# Patient Record
Sex: Male | Born: 1950
Health system: Southern US, Community
[De-identification: ages and names within clinical notes are randomized; demographics above are authoritative.]

## PROBLEM LIST (undated history)

## (undated) ENCOUNTER — Emergency Department (HOSPITAL_COMMUNITY): Discharge status: Absent without leave | Payer: Medicare Other

## (undated) DIAGNOSIS — Z8673 Personal history of transient ischemic attack (TIA), and cerebral infarction without residual deficits: Secondary | ICD-10-CM

## (undated) DIAGNOSIS — E559 Vitamin D deficiency, unspecified: Secondary | ICD-10-CM

## (undated) DIAGNOSIS — I5022 Chronic systolic (congestive) heart failure: Secondary | ICD-10-CM

## (undated) DIAGNOSIS — M1990 Unspecified osteoarthritis, unspecified site: Secondary | ICD-10-CM

## (undated) DIAGNOSIS — I10 Essential (primary) hypertension: Secondary | ICD-10-CM

## (undated) DIAGNOSIS — I2510 Atherosclerotic heart disease of native coronary artery without angina pectoris: Secondary | ICD-10-CM

## (undated) DIAGNOSIS — E785 Hyperlipidemia, unspecified: Secondary | ICD-10-CM

## (undated) DIAGNOSIS — E039 Hypothyroidism, unspecified: Secondary | ICD-10-CM

## (undated) DIAGNOSIS — G4730 Sleep apnea, unspecified: Secondary | ICD-10-CM

## (undated) DIAGNOSIS — I255 Ischemic cardiomyopathy: Secondary | ICD-10-CM

## (undated) DIAGNOSIS — M549 Dorsalgia, unspecified: Secondary | ICD-10-CM

## (undated) DIAGNOSIS — I714 Abdominal aortic aneurysm, without rupture, unspecified: Secondary | ICD-10-CM

## (undated) DIAGNOSIS — E669 Obesity, unspecified: Secondary | ICD-10-CM

## (undated) DIAGNOSIS — I219 Acute myocardial infarction, unspecified: Secondary | ICD-10-CM

## (undated) DIAGNOSIS — K279 Peptic ulcer, site unspecified, unspecified as acute or chronic, without hemorrhage or perforation: Secondary | ICD-10-CM

## (undated) DIAGNOSIS — K219 Gastro-esophageal reflux disease without esophagitis: Secondary | ICD-10-CM

## (undated) HISTORY — DX: Hyperlipidemia, unspecified: E78.5

## (undated) HISTORY — PX: KNEE ARTHROSCOPY: SUR90

## (undated) HISTORY — DX: Gastro-esophageal reflux disease without esophagitis: K21.9

## (undated) HISTORY — DX: Abdominal aortic aneurysm, without rupture, unspecified: I71.40

## (undated) HISTORY — DX: Hypothyroidism, unspecified: E03.9

## (undated) HISTORY — DX: Unspecified osteoarthritis, unspecified site: M19.90

## (undated) HISTORY — DX: Atherosclerotic heart disease of native coronary artery without angina pectoris: I25.10

## (undated) HISTORY — DX: Chronic systolic (congestive) heart failure: I50.22

## (undated) HISTORY — DX: Ischemic cardiomyopathy: I25.5

## (undated) HISTORY — DX: Abdominal aortic aneurysm, without rupture: I71.4

## (undated) HISTORY — DX: Dorsalgia, unspecified: M54.9

## (undated) HISTORY — DX: Acute myocardial infarction, unspecified: I21.9

## (undated) HISTORY — DX: Peptic ulcer, site unspecified, unspecified as acute or chronic, without hemorrhage or perforation: K27.9

## (undated) HISTORY — DX: Essential (primary) hypertension: I10

## (undated) HISTORY — DX: Personal history of transient ischemic attack (TIA), and cerebral infarction without residual deficits: Z86.73

## (undated) HISTORY — DX: Obesity, unspecified: E66.9

---
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
## 2010-03-25 ENCOUNTER — Emergency Department (HOSPITAL_COMMUNITY): Admission: EM | Admit: 2010-03-25 | Discharge: 2010-03-25 | Payer: Self-pay | Admitting: Family Medicine

## 2010-03-29 ENCOUNTER — Emergency Department (HOSPITAL_COMMUNITY): Admission: EM | Admit: 2010-03-29 | Discharge: 2010-03-29 | Payer: Self-pay | Admitting: Internal Medicine

## 2010-04-01 ENCOUNTER — Emergency Department (HOSPITAL_COMMUNITY): Admission: EM | Admit: 2010-04-01 | Discharge: 2010-04-01 | Payer: Self-pay | Admitting: Emergency Medicine

## 2010-10-17 ENCOUNTER — Encounter: Payer: Self-pay | Admitting: Cardiology

## 2010-10-27 NOTE — Letter (Signed)
Summary: External Correspondence/ [facility]  External Correspondence/ [facility]   Imported By: Dorise Hiss 10/18/2010 15:46:00  _____________________________________________________________________  External Attachment:    Type:   Image     Comment:   External Document

## 2010-11-11 ENCOUNTER — Ambulatory Visit: Payer: Self-pay | Admitting: Cardiology

## 2010-11-24 ENCOUNTER — Encounter: Payer: Self-pay | Admitting: *Deleted

## 2010-12-05 ENCOUNTER — Encounter: Payer: Self-pay | Admitting: Cardiology

## 2010-12-06 ENCOUNTER — Encounter: Payer: Self-pay | Admitting: *Deleted

## 2010-12-06 ENCOUNTER — Encounter: Payer: Self-pay | Admitting: Cardiology

## 2010-12-06 ENCOUNTER — Ambulatory Visit (INDEPENDENT_AMBULATORY_CARE_PROVIDER_SITE_OTHER): Payer: Medicaid Other | Admitting: Cardiology

## 2010-12-06 VITALS — BP 109/73 | HR 91 | Ht 71.0 in | Wt 279.0 lb

## 2010-12-06 DIAGNOSIS — I10 Essential (primary) hypertension: Secondary | ICD-10-CM | POA: Insufficient documentation

## 2010-12-06 DIAGNOSIS — I2510 Atherosclerotic heart disease of native coronary artery without angina pectoris: Secondary | ICD-10-CM | POA: Insufficient documentation

## 2010-12-06 DIAGNOSIS — E1169 Type 2 diabetes mellitus with other specified complication: Secondary | ICD-10-CM | POA: Insufficient documentation

## 2010-12-06 DIAGNOSIS — I208 Other forms of angina pectoris: Secondary | ICD-10-CM

## 2010-12-06 DIAGNOSIS — I209 Angina pectoris, unspecified: Secondary | ICD-10-CM

## 2010-12-06 DIAGNOSIS — E782 Mixed hyperlipidemia: Secondary | ICD-10-CM

## 2010-12-06 DIAGNOSIS — F17210 Nicotine dependence, cigarettes, uncomplicated: Secondary | ICD-10-CM | POA: Insufficient documentation

## 2010-12-06 DIAGNOSIS — F17200 Nicotine dependence, unspecified, uncomplicated: Secondary | ICD-10-CM

## 2010-12-06 NOTE — Assessment & Plan Note (Signed)
Smoking cessation was discussed. He would likely be a good candidate for nicotine replacement therapy, perhaps patches with PRN gum, and could consider use of the quit line.

## 2010-12-06 NOTE — Assessment & Plan Note (Signed)
Blood pressure is well-controlled today. 

## 2010-12-06 NOTE — Assessment & Plan Note (Signed)
Diagnosis reported by patient, no specific records to support this as yet. He clearly has active risk factors, and likely does have some degree of coronary atherosclerosis.

## 2010-12-06 NOTE — Assessment & Plan Note (Signed)
Noted over the last 6 months, and with reported history of CAD and previous "heart attack." No other details are available. Given his active risk factor profile, symptoms, and reported history, we will proceed with further objective testing to include a 2-D echocardiogram as well as a 2-day Lexiscan Cardiolite. Office followup arranged.

## 2010-12-06 NOTE — Assessment & Plan Note (Signed)
Continues on statin therapy, with followup lipids to be obtained by Dr. Lysbeth Galas. Generally would aim for LDL control around 70 given reported diagnosis of coronary artery disease.

## 2010-12-06 NOTE — Patient Instructions (Signed)
Your physician has requested that you have an echocardiogram. Echocardiography is a painless test that uses sound waves to create images of your heart. It provides your doctor with information about the size and shape of your heart and how well your heart's chambers and valves are working. This procedure takes approximately one hour. There are no restrictions for this procedure. Your physician has requested that you have a lexiscan cardiolite. For further information please visit [email or web address] Please follow instruction sheet, as given. Your physician discussed the hazards of tobacco use. Tobacco use cessation is recommended and techniques and options to help you quit were discussed. Follow up in 1 month as planned.

## 2010-12-06 NOTE — Assessment & Plan Note (Signed)
We discussed basic exercise and weight loss strategies.

## 2010-12-06 NOTE — Progress Notes (Signed)
Clinical Summary [name] is a 60 y.o.male referred for cardiology consultation. He recently established with Dr. Lysbeth Barrera, seen in the office back in February of this year. His history is reviewed below. He did bring in a packet of information regarding his most recent medical followup through the [facility]. Apparently he was in federal prison for 18 years, was released last August. He states that he was in a variety of different prisons in different states over the years, most recently [place].  His most recent records do not indicate any clear history of heart disease. The patient tells me however that he had a "heart attack" that was reportedly "mild" in May of 2005 while in a prison in [place]. He states that he was managed medically, and has undergone no specific cardiac testing since that time.  Over the last 6 months he describes experiencing dyspnea on exertion when he "overdoes it" and also at thost times has a chest "tightness" that resolves with rest. This occurs no more than one or 2 times a month, and in fact has not occurred over the last month.  Old records from February 2011 showed lab work indicating cholesterol 177, triglycerides 184, HDL 35, LDL 105, TSH 3.6.  He states that he has been trying to work on smoking cessation, has quit smoking in the past. Never long term however. He denies to me any history of alcohol or drug abuse.  No Known Allergies  Current outpatient prescriptions:acetaminophen (TYLENOL) 650 MG CR tablet, Take one by mouth twice daily , Disp: , Rfl: ;  aspirin 81 MG tablet, Take 81 mg by mouth daily.  , Disp: , Rfl: ;  atorvastatin (LIPITOR) 40 MG tablet, Take 40 mg by mouth daily.  , Disp: , Rfl: ;  Cyanocobalamin (VITAMIN B-12) 2500 MCG SUBL, Take one by mouth twice daily  , Disp: , Rfl:  levothyroxine (SYNTHROID, LEVOTHROID) 100 MCG tablet, Take 1/2 by mouth daily  , Disp: , Rfl: ;  Omega-3 Fatty Acids (FISH OIL)  1200 MG CAPS, Take one by mouth twice daily  , Disp: , Rfl: ;  omeprazole (PRILOSEC) 20 MG capsule, Take 20 mg by mouth daily.  , Disp: , Rfl: ;  triamterene-hydrochlorothiazide (MAXZIDE) 75-50 MG per tablet, Take 1 tablet by mouth daily.  , Disp: , Rfl:   Past Medical History  Diagnosis Date  . Coronary artery disease     Details unclear  . Essential hypertension, benign   . GERD (gastroesophageal reflux disease)   . Arthritis     Hips and knees  . MI (myocardial infarction)     States "heart attack" 2/95 in prison in [place]  . Peptic ulcer     GIB 1994  . Obesity   . Stroke     States "stroke" with left sided weakness 5/95 in prison in [place]  . Hemorrhoids   . Back pain     Past Surgical History  Procedure Date  . Knee arthroscopy     Right knee    Family History  Problem Relation Age of Onset  . Hypertension Mother   . Diabetes Mother   . Heart attack Brother   . Aneurysm Brother   . Cirrhosis Brother     Social History Bruce Barrera reports that he has been smoking Cigarettes.  He has been smoking about 1 pack per day. He has never used smokeless tobacco. Bruce Barrera reports that he does not drink alcohol.  Review of  Systems No fevers, chills, or unusual weight change. No cough, hemoptysis, or wheezing. No palpitations, dizziness, or syncope. No dysphasia or odynophagia. Stable appetite with no abdominal pain, melena, or hematochezia. No orthopnea, PND. Occasional mild lower extremity edema. No focal motor weakness, memory problems, or speech deficits. Reports some altered sensation in his left hand following remote stroke. Has chronic problems with knee pain and hip pain related to arthritis.Otherwise systems reviewed and negative except as already outlined.   Physical Examination Filed Vitals:   12/06/10 1402  BP: 109/73  Pulse: 91  Morbidly obese male in no acute distress. HEENT: Conjunctiva and lids normal, oropharynx with poor dentition. Neck: Supple, increased  girth, no significant carotid bruits or thyromegaly. Lungs: Diminished breath sounds, generally clear, nonlabored. Cardiac: Distant regular heart sounds, indistinct PMI, no significant systolic murmur or gallop. Abdomen: Morbidly obese, protuberant, unable to palpate liver edge, bowel sounds present. No guarding or rebound. Skin: Warm and dry. Extremities: Trace edema below the knees with distal venous stasis, distal pulses one plus. Musculoskeletal: No kyphosis. Neuropsychiatric: Alert and oriented x3, affect grossly appropriate.   ECG Normal sinus rhythm at 92 beats per minute with nonspecific ST changes.  Problem List and Plan

## 2010-12-21 ENCOUNTER — Encounter: Payer: Self-pay | Admitting: Cardiology

## 2011-01-03 ENCOUNTER — Ambulatory Visit: Payer: Medicaid Other | Admitting: Cardiology

## 2011-02-08 DIAGNOSIS — I2510 Atherosclerotic heart disease of native coronary artery without angina pectoris: Secondary | ICD-10-CM

## 2011-02-17 ENCOUNTER — Encounter: Payer: Self-pay | Admitting: Cardiology

## 2011-03-09 ENCOUNTER — Ambulatory Visit (AMBULATORY_SURGERY_CENTER): Payer: Medicaid Other | Admitting: *Deleted

## 2011-03-09 VITALS — Ht 71.0 in | Wt 280.0 lb

## 2011-03-09 DIAGNOSIS — Z1211 Encounter for screening for malignant neoplasm of colon: Secondary | ICD-10-CM

## 2011-03-09 MED ORDER — PEG-KCL-NACL-NASULF-NA ASC-C 100 G PO SOLR: ORAL | Status: DC

## 2011-03-23 ENCOUNTER — Encounter: Payer: Self-pay | Admitting: Internal Medicine

## 2011-03-23 ENCOUNTER — Ambulatory Visit (AMBULATORY_SURGERY_CENTER): Payer: Medicaid Other | Admitting: Internal Medicine

## 2011-03-23 VITALS — BP 122/83 | HR 91 | Temp 98.8°F | Resp 21 | Ht 71.0 in | Wt 270.0 lb

## 2011-03-23 DIAGNOSIS — K649 Unspecified hemorrhoids: Secondary | ICD-10-CM

## 2011-03-23 DIAGNOSIS — Z1211 Encounter for screening for malignant neoplasm of colon: Secondary | ICD-10-CM

## 2011-03-23 HISTORY — PX: COLONOSCOPY: SHX174

## 2011-03-23 MED ORDER — SODIUM CHLORIDE 0.9 % IV SOLN: 500.0000 mL | INTRAVENOUS | Status: DC

## 2011-03-23 NOTE — Patient Instructions (Signed)
Discharge instructions given. Handouts on hemorrhoids and a high fiber diet. Resume previous medications.

## 2011-03-23 NOTE — Progress Notes (Signed)
Pt tolerated the colon exam very well. MAW

## 2011-03-24 ENCOUNTER — Telehealth: Payer: Self-pay | Admitting: *Deleted

## 2011-03-24 NOTE — Telephone Encounter (Signed)

## 2011-03-30 ENCOUNTER — Telehealth: Payer: Self-pay | Admitting: *Deleted

## 2011-03-30 NOTE — Telephone Encounter (Signed)
Error-No phone call made

## 2012-09-11 HISTORY — PX: TOTAL HIP ARTHROPLASTY: SHX124

## 2015-07-02 ENCOUNTER — Encounter (INDEPENDENT_AMBULATORY_CARE_PROVIDER_SITE_OTHER): Payer: Self-pay | Admitting: Ophthalmology

## 2016-03-12 ENCOUNTER — Inpatient Hospital Stay (HOSPITAL_COMMUNITY)
Admission: EM | Admit: 2016-03-12 | Discharge: 2016-03-16 | DRG: 280 | Disposition: A | Discharge status: Home Health | Payer: Medicaid Other | Attending: Internal Medicine | Admitting: Internal Medicine

## 2016-03-12 ENCOUNTER — Encounter (HOSPITAL_COMMUNITY): Payer: Self-pay

## 2016-03-12 ENCOUNTER — Emergency Department (HOSPITAL_COMMUNITY): Payer: Medicaid Other

## 2016-03-12 DIAGNOSIS — I5021 Acute systolic (congestive) heart failure: Secondary | ICD-10-CM | POA: Diagnosis present

## 2016-03-12 DIAGNOSIS — E1165 Type 2 diabetes mellitus with hyperglycemia: Secondary | ICD-10-CM | POA: Diagnosis present

## 2016-03-12 DIAGNOSIS — I2109 ST elevation (STEMI) myocardial infarction involving other coronary artery of anterior wall: Principal | ICD-10-CM | POA: Diagnosis present

## 2016-03-12 DIAGNOSIS — J441 Chronic obstructive pulmonary disease with (acute) exacerbation: Secondary | ICD-10-CM | POA: Diagnosis present

## 2016-03-12 DIAGNOSIS — I69354 Hemiplegia and hemiparesis following cerebral infarction affecting left non-dominant side: Secondary | ICD-10-CM

## 2016-03-12 DIAGNOSIS — I272 Other secondary pulmonary hypertension: Secondary | ICD-10-CM | POA: Diagnosis present

## 2016-03-12 DIAGNOSIS — I255 Ischemic cardiomyopathy: Secondary | ICD-10-CM | POA: Diagnosis present

## 2016-03-12 DIAGNOSIS — I110 Hypertensive heart disease with heart failure: Secondary | ICD-10-CM | POA: Diagnosis present

## 2016-03-12 DIAGNOSIS — Z7982 Long term (current) use of aspirin: Secondary | ICD-10-CM

## 2016-03-12 DIAGNOSIS — M1990 Unspecified osteoarthritis, unspecified site: Secondary | ICD-10-CM | POA: Diagnosis present

## 2016-03-12 DIAGNOSIS — R079 Chest pain, unspecified: Secondary | ICD-10-CM | POA: Diagnosis present

## 2016-03-12 DIAGNOSIS — IMO0001 Reserved for inherently not codable concepts without codable children: Secondary | ICD-10-CM | POA: Diagnosis present

## 2016-03-12 DIAGNOSIS — F17210 Nicotine dependence, cigarettes, uncomplicated: Secondary | ICD-10-CM | POA: Diagnosis present

## 2016-03-12 DIAGNOSIS — J449 Chronic obstructive pulmonary disease, unspecified: Secondary | ICD-10-CM | POA: Diagnosis present

## 2016-03-12 DIAGNOSIS — I2510 Atherosclerotic heart disease of native coronary artery without angina pectoris: Secondary | ICD-10-CM | POA: Diagnosis present

## 2016-03-12 DIAGNOSIS — E669 Obesity, unspecified: Secondary | ICD-10-CM | POA: Diagnosis present

## 2016-03-12 DIAGNOSIS — Z6834 Body mass index (BMI) 34.0-34.9, adult: Secondary | ICD-10-CM

## 2016-03-12 DIAGNOSIS — E785 Hyperlipidemia, unspecified: Secondary | ICD-10-CM | POA: Diagnosis present

## 2016-03-12 DIAGNOSIS — I10 Essential (primary) hypertension: Secondary | ICD-10-CM | POA: Diagnosis present

## 2016-03-12 DIAGNOSIS — M7989 Other specified soft tissue disorders: Secondary | ICD-10-CM

## 2016-03-12 DIAGNOSIS — Z9981 Dependence on supplemental oxygen: Secondary | ICD-10-CM

## 2016-03-12 DIAGNOSIS — I214 Non-ST elevation (NSTEMI) myocardial infarction: Secondary | ICD-10-CM

## 2016-03-12 DIAGNOSIS — E039 Hypothyroidism, unspecified: Secondary | ICD-10-CM | POA: Diagnosis present

## 2016-03-12 DIAGNOSIS — E7800 Pure hypercholesterolemia, unspecified: Secondary | ICD-10-CM | POA: Diagnosis present

## 2016-03-12 DIAGNOSIS — Z79899 Other long term (current) drug therapy: Secondary | ICD-10-CM

## 2016-03-12 DIAGNOSIS — K219 Gastro-esophageal reflux disease without esophagitis: Secondary | ICD-10-CM | POA: Diagnosis present

## 2016-03-12 DIAGNOSIS — I252 Old myocardial infarction: Secondary | ICD-10-CM

## 2016-03-12 LAB — I-STAT TROPONIN, ED: Troponin i, poc: 0.03 ng/mL (ref 0.00–0.08)

## 2016-03-12 LAB — CBC
HCT: 49.4 % (ref 39.0–52.0)
Hemoglobin: 17 g/dL (ref 13.0–17.0)
MCH: 33.7 pg (ref 26.0–34.0)
MCHC: 34.4 g/dL (ref 30.0–36.0)
MCV: 98 fL (ref 78.0–100.0)
Platelets: 124 10*3/uL — ABNORMAL LOW (ref 150–400)
RBC: 5.04 MIL/uL (ref 4.22–5.81)
RDW: 14.5 % (ref 11.5–15.5)
WBC: 10.9 10*3/uL — ABNORMAL HIGH (ref 4.0–10.5)

## 2016-03-12 MED ORDER — FENTANYL CITRATE (PF) 100 MCG/2ML IJ SOLN
50.0000 ug | Freq: Once | INTRAMUSCULAR | Status: AC
  Administered 2016-03-13: 50 ug via INTRAVENOUS
  Filled 2016-03-12: qty 2

## 2016-03-12 MED ORDER — SODIUM CHLORIDE 0.9 % IV BOLUS (SEPSIS)
1000.0000 mL | Freq: Once | INTRAVENOUS | Status: AC
  Administered 2016-03-13: 1000 mL via INTRAVENOUS

## 2016-03-12 MED ORDER — ASPIRIN 81 MG PO CHEW
324.0000 mg | CHEWABLE_TABLET | Freq: Once | ORAL | Status: AC
  Administered 2016-03-13: 324 mg via ORAL
  Filled 2016-03-12: qty 4

## 2016-03-12 MED ORDER — ONDANSETRON HCL 4 MG/2ML IJ SOLN
4.0000 mg | Freq: Once | INTRAMUSCULAR | Status: AC
  Administered 2016-03-13: 4 mg via INTRAVENOUS
  Filled 2016-03-12: qty 2

## 2016-03-12 NOTE — ED Provider Notes (Signed)
TIME SEEN: 11:30 PM  CHIEF COMPLAINT:  Chest pain, shortness of breath  HPI: Pt is a 65 y.o. male with history of hypertension, hyperlipidemia, reported history of a stroke and heart attack while in prison in 1995 but denies history of cardiac catheter position or stent placement who presents emergency department with chest pain that started around 5:30 to 6 PM while at rest tonight. Describes it as a tightness, pressure like he "can't open his lungs up" that radiates up into his throat and he feels that his throat is closing. States he feels short of breath, nauseous and was diaphoretic. No dizziness. Has had some dry cough. No fever. No lower extreme swelling or pain. No history of PE or DVT. PCP is Dr. Lysbeth GalasNyland. Does not have a cardiologist here. States his last stress test was 4 years ago. He is on 2 L of oxygen at home as he states he has "bad allergies" and "a touch of COPD". States he is not sure if this is similar to his prior heart attack because he was "out of it" in 1995 were not occurred.  ROS: See HPI Constitutional: no fever  Eyes: no drainage  ENT: no runny nose   Cardiovascular:   chest pain  Resp:  SOB  GI: no vomiting GU: no dysuria Integumentary: no rash  Allergy: no hives  Musculoskeletal: no leg swelling  Neurological: no slurred speech ROS otherwise negative  PAST MEDICAL HISTORY/PAST SURGICAL HISTORY:  Past Medical History  Diagnosis Date  . Coronary artery disease     Details unclear  . Essential hypertension, benign   . GERD (gastroesophageal reflux disease)   . Arthritis     Hips and knees  . MI (myocardial infarction) (HCC)     States "heart attack" 2/95 in prison in [place]  . Peptic ulcer     GIB 1994  . Obesity   . Stroke [facility](HCC)     States "stroke" with left sided weakness 5/95 in prison in [place]  . Hemorrhoids   . Back pain   . Hyperlipidemia   . Thyroid disease     MEDICATIONS:  Prior to Admission medications   Medication Sig Start Date End Date  Taking? Authorizing Provider  aspirin 81 MG tablet Take 81 mg by mouth daily.     Yes Historical Provider, MD  atorvastatin (LIPITOR) 40 MG tablet Take 40 mg by mouth daily.     Yes Historical Provider, MD  Cyanocobalamin (VITAMIN B-12) 2500 MCG SUBL Take one by mouth twice daily     Yes Historical Provider, MD  levothyroxine (SYNTHROID, LEVOTHROID) 100 MCG tablet Take 1/2 by mouth daily     Yes Historical Provider, MD  metoprolol succinate (TOPROL-XL) 25 MG 24 hr tablet Take 25 mg by mouth daily.   Yes Historical Provider, MD  Omega-3 Fatty Acids (FISH OIL) 1200 MG CAPS Take one by mouth twice daily     Yes Historical Provider, MD  omeprazole (PRILOSEC) 20 MG capsule Take 20 mg by mouth daily.     Yes Historical Provider, MD  pravastatin (PRAVACHOL) 40 MG tablet Take 40 mg by mouth daily.   Yes Historical Provider, MD  triamterene-hydrochlorothiazide (MAXZIDE) 75-50 MG per tablet Take 1 tablet by mouth daily.     Yes Historical Provider, MD  acetaminophen (TYLENOL) 650 MG CR tablet Take one by mouth twice daily     Historical Provider, MD  celecoxib (CELEBREX) 200 MG capsule Take 200 mg by mouth daily.  Historical Provider, MD  vardenafil (LEVITRA) 20 MG tablet Take 20 mg by mouth daily as needed.      Historical Provider, MD    ALLERGIES:  No Known Allergies  SOCIAL HISTORY:  Social History  Substance Use Topics  . Smoking status: Current Every Day Smoker -- 1.00 packs/day    Types: Cigarettes  . Smokeless tobacco: Never Used  . Alcohol Use: No    FAMILY HISTORY: Family History  Problem Relation Age of Onset  . Hypertension Mother   . Diabetes Mother   . Heart disease Mother   . Stroke Mother   . Heart attack Brother   . Aneurysm Brother   . Cirrhosis Brother   . Stroke Father     EXAM: BP 96/65 mmHg  Pulse 84  Temp(Src) 98.2 F (36.8 C) (Oral)  Resp 18  SpO2 89% CONSTITUTIONAL: Alert and oriented and responds appropriately to questions. Chronically  ill-appearing, appears uncomfortable, afebrile and nontoxic HEAD: Normocephalic EYES: Conjunctivae clear, PERRL ENT: normal nose; no rhinorrhea; moist mucous membranes NECK: Supple, no meningismus, no LAD  CARD: RRR; S1 and S2 appreciated; no murmurs, no clicks, no rubs, no gallops RESP: Normal chest excursion without splinting or tachypnea; breath sounds clear and equal bilaterally; no wheezes, no rhonchi, no rales, patient is hypoxic on room air, no respiratory distress, speaking full sentences ABD/GI: Normal bowel sounds; non-distended; soft, non-tender, no rebound, no guarding, no peritoneal signs BACK:  The back appears normal and is non-tender to palpation, there is no CVA tenderness EXT: Normal ROM in all joints; non-tender to palpation; no edema; normal capillary refill; no cyanosis, no calf tenderness or swelling    SKIN: Normal color for age and race; warm; no rash NEURO: Moves all extremities equally, sensation to light touch intact diffusely, cranial nerves II through XII intact PSYCH: The patient's mood and manner are appropriate. Grooming and personal hygiene are appropriate.  MEDICAL DECISION MAKING: Patient here with chest pain, shortness of breath. No old EKG for comparison but does have concerning ST morphology and anterior leads. Does not meet STEMI criteria at this time. Has had constant symptoms for 6 hours. I-STAT troponin is 0.03. We'll obtain cardiac labs, chest x-ray. Has slightly low blood pressure. Will hold off on nitroglycerin give fentanyl for pain, IV fluids, Zofran. We'll give aspirin. I feel he will need admission.  ED PROGRESS: Pt's regular troponin is mildly elevated at 0.04 but this is in the setting of having constant pain for 6 hours. BNP mildly elevated at 361. D-dimer positive at 0.87. Will proceed with CT scan of his chest. Chest x-ray shows bronchial thickening without infiltrate, edema. Patient's pain is improving with IV fentanyl. Blood pressure has also  improved with IV fluids.    CT scan shows no pulmonary embolus. He has bronchial thickening that has progressed from prior radiograph. We'll give DuoNeb, Solu-Medrol. I feel he will need admission for chest pain rule out. We'll discuss with hospitalist.  2:05 AM  Discussed patient's case with hospitalist, Dr. Sharl Ma.  Recommend admission to inpatient, telemetry bed.  I will place holding orders per their request. Patient and family (if present) updated with plan. Care transferred to hospitalist service.  I reviewed all nursing notes, vitals, pertinent old records, EKGs, labs, imaging (as available).     EKG Interpretation  Date/Time:  Sunday March 12 2016 23:07:38 EDT Ventricular Rate:  78 PR Interval:    QRS Duration: 108 QT Interval:  448 QTC Calculation: 511 R Axis:   -  63 Text Interpretation:  Sinus rhythm Left anterior fascicular block Probable anterolateral infarct, age indeterm Abnormal T, consider ischemia, lateral leads Prolonged QT interval Baseline wander in lead(s) II V6 Baseline wander No old tracing to compare Confirmed by Firas Guardado,  DO, Ivyanna Sibert [phone]([id number]) on 03/12/2016 11:31:54 PM          EKG Interpretation  Date/Time:  Sunday March 12 2016 23:32:09 EDT Ventricular Rate:  70 PR Interval:    QRS Duration: 121 QT Interval:  473 QTC Calculation: 511 R Axis:   -76 Text Interpretation:  Sinus rhythm IVCD, consider atypical RBBB Anterior infarct, age indeterminate Lateral leads are also involved Confirmed by Jenan Ellegood,  DO, Lahna Nath [phone] on 03/12/2016 11:36:09 PM        Layla MawKristen N Ciarah Peace, DO 03/13/16 0206

## 2016-03-12 NOTE — ED Notes (Signed)
Central chest pain X3 hours. Nausea shortness of breath and diaphoresis. Patient states "it feels like my throat is closing up"

## 2016-03-13 ENCOUNTER — Observation Stay (HOSPITAL_COMMUNITY): Payer: Medicaid Other

## 2016-03-13 ENCOUNTER — Emergency Department (HOSPITAL_COMMUNITY): Payer: Medicaid Other

## 2016-03-13 ENCOUNTER — Observation Stay (HOSPITAL_BASED_OUTPATIENT_CLINIC_OR_DEPARTMENT_OTHER): Payer: Medicaid Other

## 2016-03-13 ENCOUNTER — Encounter (HOSPITAL_COMMUNITY): Payer: Self-pay | Admitting: *Deleted

## 2016-03-13 DIAGNOSIS — J441 Chronic obstructive pulmonary disease with (acute) exacerbation: Secondary | ICD-10-CM | POA: Diagnosis present

## 2016-03-13 DIAGNOSIS — I69354 Hemiplegia and hemiparesis following cerebral infarction affecting left non-dominant side: Secondary | ICD-10-CM | POA: Diagnosis not present

## 2016-03-13 DIAGNOSIS — I2109 ST elevation (STEMI) myocardial infarction involving other coronary artery of anterior wall: Secondary | ICD-10-CM | POA: Diagnosis present

## 2016-03-13 DIAGNOSIS — E669 Obesity, unspecified: Secondary | ICD-10-CM | POA: Diagnosis present

## 2016-03-13 DIAGNOSIS — Z9981 Dependence on supplemental oxygen: Secondary | ICD-10-CM | POA: Diagnosis not present

## 2016-03-13 DIAGNOSIS — K219 Gastro-esophageal reflux disease without esophagitis: Secondary | ICD-10-CM | POA: Diagnosis present

## 2016-03-13 DIAGNOSIS — E1165 Type 2 diabetes mellitus with hyperglycemia: Secondary | ICD-10-CM | POA: Diagnosis present

## 2016-03-13 DIAGNOSIS — R7989 Other specified abnormal findings of blood chemistry: Secondary | ICD-10-CM | POA: Diagnosis not present

## 2016-03-13 DIAGNOSIS — Z7982 Long term (current) use of aspirin: Secondary | ICD-10-CM | POA: Diagnosis not present

## 2016-03-13 DIAGNOSIS — I110 Hypertensive heart disease with heart failure: Secondary | ICD-10-CM | POA: Diagnosis present

## 2016-03-13 DIAGNOSIS — I209 Angina pectoris, unspecified: Secondary | ICD-10-CM | POA: Diagnosis not present

## 2016-03-13 DIAGNOSIS — M1990 Unspecified osteoarthritis, unspecified site: Secondary | ICD-10-CM | POA: Diagnosis present

## 2016-03-13 DIAGNOSIS — I255 Ischemic cardiomyopathy: Secondary | ICD-10-CM | POA: Diagnosis present

## 2016-03-13 DIAGNOSIS — F17210 Nicotine dependence, cigarettes, uncomplicated: Secondary | ICD-10-CM | POA: Diagnosis present

## 2016-03-13 DIAGNOSIS — E039 Hypothyroidism, unspecified: Secondary | ICD-10-CM | POA: Diagnosis present

## 2016-03-13 DIAGNOSIS — I252 Old myocardial infarction: Secondary | ICD-10-CM | POA: Diagnosis not present

## 2016-03-13 DIAGNOSIS — R079 Chest pain, unspecified: Secondary | ICD-10-CM

## 2016-03-13 DIAGNOSIS — Z6834 Body mass index (BMI) 34.0-34.9, adult: Secondary | ICD-10-CM | POA: Diagnosis not present

## 2016-03-13 DIAGNOSIS — E7800 Pure hypercholesterolemia, unspecified: Secondary | ICD-10-CM | POA: Diagnosis present

## 2016-03-13 DIAGNOSIS — R072 Precordial pain: Secondary | ICD-10-CM | POA: Diagnosis not present

## 2016-03-13 DIAGNOSIS — J449 Chronic obstructive pulmonary disease, unspecified: Secondary | ICD-10-CM | POA: Diagnosis present

## 2016-03-13 DIAGNOSIS — I5021 Acute systolic (congestive) heart failure: Secondary | ICD-10-CM | POA: Diagnosis present

## 2016-03-13 DIAGNOSIS — I25118 Atherosclerotic heart disease of native coronary artery with other forms of angina pectoris: Secondary | ICD-10-CM | POA: Diagnosis not present

## 2016-03-13 DIAGNOSIS — I272 Other secondary pulmonary hypertension: Secondary | ICD-10-CM | POA: Diagnosis present

## 2016-03-13 DIAGNOSIS — Z79899 Other long term (current) drug therapy: Secondary | ICD-10-CM | POA: Diagnosis not present

## 2016-03-13 DIAGNOSIS — I10 Essential (primary) hypertension: Secondary | ICD-10-CM | POA: Diagnosis not present

## 2016-03-13 DIAGNOSIS — E785 Hyperlipidemia, unspecified: Secondary | ICD-10-CM | POA: Diagnosis present

## 2016-03-13 DIAGNOSIS — I2510 Atherosclerotic heart disease of native coronary artery without angina pectoris: Secondary | ICD-10-CM | POA: Diagnosis present

## 2016-03-13 LAB — GLUCOSE, CAPILLARY
Glucose-Capillary: 187 mg/dL — ABNORMAL HIGH (ref 65–99)
Glucose-Capillary: 272 mg/dL — ABNORMAL HIGH (ref 65–99)
Glucose-Capillary: 239 mg/dL — ABNORMAL HIGH (ref 65–99)

## 2016-03-13 LAB — ECHOCARDIOGRAM COMPLETE
E decel time: 187 msec
E/e' ratio: 11.26
FS: 35 % (ref 28–44)
Height: 71 in
IVS/LV PW RATIO, ED: 1
LA ID, A-P, ES: 41 mm
LA diam end sys: 41 mm
LA diam index: 1.74 cm/m2
LA vol A4C: 79 ml
LA vol index: 30.3 mL/m2
LA vol: 71.5 mL
LV E/e' medial: 11.26
LV E/e'average: 11.26
LV PW d: 12.6 mm — AB (ref 0.6–1.1)
LV e' LATERAL: 8.49 cm/s
LVOT area: 3.14 cm2
LVOT diameter: 20 mm
MV Dec: 187
MV Peak grad: 4 mmHg
MV pk A vel: 108 m/s
MV pk E vel: 95.6 m/s
TAPSE: 32.1 mm
TDI e' lateral: 8.49
TDI e' medial: 6.53
Weight: 4162.28 oz

## 2016-03-13 LAB — COMPREHENSIVE METABOLIC PANEL
ALT: 20 U/L (ref 17–63)
AST: 21 U/L (ref 15–41)
Albumin: 3.6 g/dL (ref 3.5–5.0)
Alkaline Phosphatase: 100 U/L (ref 38–126)
Anion gap: 5 (ref 5–15)
BUN: 12 mg/dL (ref 6–20)
CO2: 26 mmol/L (ref 22–32)
Calcium: 8.4 mg/dL — ABNORMAL LOW (ref 8.9–10.3)
Chloride: 105 mmol/L (ref 101–111)
Creatinine, Ser: 0.62 mg/dL (ref 0.61–1.24)
GFR calc Af Amer: >60 mL/min (ref >60)
GFR calc non Af Amer: >60 mL/min (ref >60)
Glucose, Bld: 200 mg/dL — ABNORMAL HIGH (ref 65–99)
Potassium: 3.7 mmol/L (ref 3.5–5.1)
Sodium: 136 mmol/L (ref 135–145)
Total Bilirubin: 0.7 mg/dL (ref 0.3–1.2)
Total Protein: 6.7 g/dL (ref 6.5–8.1)

## 2016-03-13 LAB — CBC
HCT: 46.3 % (ref 39.0–52.0)
Hemoglobin: 15.3 g/dL (ref 13.0–17.0)
MCH: 32.3 pg (ref 26.0–34.0)
MCHC: 33 g/dL (ref 30.0–36.0)
MCV: 97.7 fL (ref 78.0–100.0)
Platelets: 115 10*3/uL — ABNORMAL LOW (ref 150–400)
RBC: 4.74 MIL/uL (ref 4.22–5.81)
RDW: 14.5 % (ref 11.5–15.5)
WBC: 13.7 10*3/uL — ABNORMAL HIGH (ref 4.0–10.5)

## 2016-03-13 LAB — BRAIN NATRIURETIC PEPTIDE: B Natriuretic Peptide: 361 pg/mL — ABNORMAL HIGH (ref 0.0–100.0)

## 2016-03-13 LAB — BASIC METABOLIC PANEL
Anion gap: 10 (ref 5–15)
BUN: 18 mg/dL (ref 6–20)
CO2: 25 mmol/L (ref 22–32)
Calcium: 8.7 mg/dL — ABNORMAL LOW (ref 8.9–10.3)
Chloride: 102 mmol/L (ref 101–111)
Creatinine, Ser: 0.87 mg/dL (ref 0.61–1.24)
GFR calc Af Amer: >60 mL/min (ref >60)
GFR calc non Af Amer: >60 mL/min (ref >60)
Glucose, Bld: 209 mg/dL — ABNORMAL HIGH (ref 65–99)
Potassium: 3.5 mmol/L (ref 3.5–5.1)
Sodium: 137 mmol/L (ref 135–145)

## 2016-03-13 LAB — HEPARIN LEVEL (UNFRACTIONATED): Heparin Unfractionated: 0.13 IU/mL — ABNORMAL LOW (ref 0.30–0.70)

## 2016-03-13 LAB — D-DIMER, QUANTITATIVE: D-Dimer, Quant: 0.87 ug/mL-FEU — ABNORMAL HIGH (ref 0.00–0.50)

## 2016-03-13 LAB — TROPONIN I
Troponin I: 0.04 ng/mL (ref <0.03)
Troponin I: 0.03 ng/mL (ref <0.03)
Troponin I: 0.03 ng/mL (ref <0.03)
Troponin I: <0.03 ng/mL (ref <0.03)

## 2016-03-13 MED ORDER — NITROGLYCERIN 0.4 MG SL SUBL: 0.4000 mg | SUBLINGUAL_TABLET | SUBLINGUAL | Status: DC | PRN

## 2016-03-13 MED ORDER — SODIUM CHLORIDE 0.9% FLUSH
3.0000 mL | Freq: Two times a day (BID) | INTRAVENOUS | Status: DC
  Administered 2016-03-13 – 2016-03-16 (×7): 3 mL via INTRAVENOUS

## 2016-03-13 MED ORDER — PANTOPRAZOLE SODIUM 40 MG PO TBEC
40.0000 mg | DELAYED_RELEASE_TABLET | Freq: Every day | ORAL | Status: DC
  Administered 2016-03-13 – 2016-03-16 (×3): 40 mg via ORAL
  Filled 2016-03-13 (×3): qty 1

## 2016-03-13 MED ORDER — IPRATROPIUM-ALBUTEROL 0.5-2.5 (3) MG/3ML IN SOLN
3.0000 mL | Freq: Four times a day (QID) | RESPIRATORY_TRACT | Status: DC
  Administered 2016-03-13 (×3): 3 mL via RESPIRATORY_TRACT
  Filled 2016-03-13 (×3): qty 3

## 2016-03-13 MED ORDER — FENTANYL CITRATE (PF) 100 MCG/2ML IJ SOLN
50.0000 ug | INTRAMUSCULAR | Status: DC | PRN
Start: 2016-03-13 — End: 2016-03-13
  Administered 2016-03-13: 50 ug via INTRAVENOUS
  Filled 2016-03-13: qty 2

## 2016-03-13 MED ORDER — IPRATROPIUM-ALBUTEROL 0.5-2.5 (3) MG/3ML IN SOLN
3.0000 mL | Freq: Three times a day (TID) | RESPIRATORY_TRACT | Status: DC
  Administered 2016-03-14: 3 mL via RESPIRATORY_TRACT
  Filled 2016-03-13: qty 3

## 2016-03-13 MED ORDER — IPRATROPIUM-ALBUTEROL 0.5-2.5 (3) MG/3ML IN SOLN
3.0000 mL | Freq: Once | RESPIRATORY_TRACT | Status: AC
  Administered 2016-03-13: 3 mL via RESPIRATORY_TRACT
  Filled 2016-03-13: qty 3

## 2016-03-13 MED ORDER — TRIAMTERENE-HCTZ 75-50 MG PO TABS
1.0000 | ORAL_TABLET | Freq: Every day | ORAL | Status: DC
  Administered 2016-03-13: 1 via ORAL
  Filled 2016-03-13: qty 1

## 2016-03-13 MED ORDER — METHYLPREDNISOLONE SODIUM SUCC 125 MG IJ SOLR
125.0000 mg | Freq: Once | INTRAMUSCULAR | Status: AC
  Administered 2016-03-13: 125 mg via INTRAVENOUS
  Filled 2016-03-13: qty 2

## 2016-03-13 MED ORDER — ONDANSETRON HCL 4 MG PO TABS: 4.0000 mg | ORAL_TABLET | Freq: Four times a day (QID) | ORAL | Status: DC | PRN

## 2016-03-13 MED ORDER — HEPARIN BOLUS VIA INFUSION
4000.0000 [IU] | Freq: Once | INTRAVENOUS | Status: AC
  Administered 2016-03-13: 4000 [IU] via INTRAVENOUS
  Filled 2016-03-13: qty 4000

## 2016-03-13 MED ORDER — METHYLPREDNISOLONE SODIUM SUCC 125 MG IJ SOLR
60.0000 mg | Freq: Four times a day (QID) | INTRAMUSCULAR | Status: DC
  Administered 2016-03-13 – 2016-03-14 (×5): 60 mg via INTRAVENOUS
  Filled 2016-03-13 (×5): qty 2

## 2016-03-13 MED ORDER — INSULIN ASPART 100 UNIT/ML ~~LOC~~ SOLN
0.0000 [IU] | Freq: Three times a day (TID) | SUBCUTANEOUS | Status: DC
  Administered 2016-03-13: 5 [IU] via SUBCUTANEOUS
  Administered 2016-03-13: 2 [IU] via SUBCUTANEOUS
  Administered 2016-03-14 (×3): 3 [IU] via SUBCUTANEOUS
  Administered 2016-03-15: 5 [IU] via SUBCUTANEOUS
  Administered 2016-03-15: 1 [IU] via SUBCUTANEOUS

## 2016-03-13 MED ORDER — IPRATROPIUM-ALBUTEROL 0.5-2.5 (3) MG/3ML IN SOLN: 3.0000 mL | RESPIRATORY_TRACT | Status: DC | PRN

## 2016-03-13 MED ORDER — CETYLPYRIDINIUM CHLORIDE 0.05 % MT LIQD
7.0000 mL | Freq: Two times a day (BID) | OROMUCOSAL | Status: DC
Start: 2016-03-13 — End: 2016-03-16
  Administered 2016-03-14 – 2016-03-16 (×3): 7 mL via OROMUCOSAL

## 2016-03-13 MED ORDER — ASPIRIN EC 81 MG PO TBEC
81.0000 mg | DELAYED_RELEASE_TABLET | Freq: Every day | ORAL | Status: DC
  Administered 2016-03-13 – 2016-03-16 (×3): 81 mg via ORAL
  Filled 2016-03-13 (×3): qty 1

## 2016-03-13 MED ORDER — PERFLUTREN LIPID MICROSPHERE
1.0000 mL | INTRAVENOUS | Status: AC | PRN
  Administered 2016-03-13: 2 mL via INTRAVENOUS

## 2016-03-13 MED ORDER — ENOXAPARIN SODIUM 40 MG/0.4ML ~~LOC~~ SOLN
40.0000 mg | SUBCUTANEOUS | Status: DC
  Administered 2016-03-13: 40 mg via SUBCUTANEOUS
  Filled 2016-03-13: qty 0.4

## 2016-03-13 MED ORDER — HEPARIN BOLUS VIA INFUSION
2000.0000 [IU] | Freq: Once | INTRAVENOUS | Status: AC
  Administered 2016-03-13: 2000 [IU] via INTRAVENOUS
  Filled 2016-03-13: qty 2000

## 2016-03-13 MED ORDER — LEVOTHYROXINE SODIUM 50 MCG PO TABS
50.0000 ug | ORAL_TABLET | Freq: Every day | ORAL | Status: DC
  Administered 2016-03-13 – 2016-03-16 (×4): 50 ug via ORAL
  Filled 2016-03-13 (×4): qty 1

## 2016-03-13 MED ORDER — IOPAMIDOL (ISOVUE-370) INJECTION 76%
100.0000 mL | Freq: Once | INTRAVENOUS | Status: AC | PRN
  Administered 2016-03-13: 100 mL via INTRAVENOUS

## 2016-03-13 MED ORDER — METOPROLOL SUCCINATE ER 25 MG PO TB24
25.0000 mg | ORAL_TABLET | Freq: Every day | ORAL | Status: DC
  Administered 2016-03-13 – 2016-03-14 (×2): 25 mg via ORAL
  Filled 2016-03-13 (×2): qty 1

## 2016-03-13 MED ORDER — SACUBITRIL-VALSARTAN 24-26 MG PO TABS
1.0000 | ORAL_TABLET | Freq: Two times a day (BID) | ORAL | Status: DC
  Administered 2016-03-13 – 2016-03-16 (×6): 1 via ORAL
  Filled 2016-03-13 (×9): qty 1

## 2016-03-13 MED ORDER — HEPARIN (PORCINE) IN NACL 100-0.45 UNIT/ML-% IJ SOLN
1800.0000 [IU]/h | INTRAMUSCULAR | Status: DC
  Administered 2016-03-13: 1200 [IU]/h via INTRAVENOUS
  Administered 2016-03-14: 1500 [IU]/h via INTRAVENOUS
  Filled 2016-03-13 (×5): qty 250

## 2016-03-13 MED ORDER — ONDANSETRON HCL 4 MG/2ML IJ SOLN: 4.0000 mg | Freq: Four times a day (QID) | INTRAMUSCULAR | Status: DC | PRN

## 2016-03-13 MED ORDER — SODIUM CHLORIDE 0.9 % IV SOLN
INTRAVENOUS | Status: DC
  Administered 2016-03-13: 04:00:00 via INTRAVENOUS

## 2016-03-13 MED ORDER — ATORVASTATIN CALCIUM 80 MG PO TABS
80.0000 mg | ORAL_TABLET | Freq: Every day | ORAL | Status: DC
  Administered 2016-03-14 – 2016-03-16 (×2): 80 mg via ORAL
  Filled 2016-03-13: qty 1
  Filled 2016-03-13: qty 2

## 2016-03-13 MED ORDER — ATORVASTATIN CALCIUM 40 MG PO TABS
40.0000 mg | ORAL_TABLET | Freq: Every day | ORAL | Status: DC
  Administered 2016-03-13: 40 mg via ORAL
  Filled 2016-03-13: qty 1

## 2016-03-13 NOTE — Consult Note (Addendum)
Primary cardiologist: Dr Nona Dell Consulting cardiologist:Dr Dina Rich Requesting physician: Dr Richarda Overlie Indication: Chest pain  Clinical Summary [name] is a 65 y.o.male self reported history of MI in May 2005 that was managed medically, tobacco abuse, HL, HTN, COPD, admitted with chest pain.  He reports a several month history of intermittent chest pain, tightness that lasts just a few seconds. Significant DOE over the last few months, for example doing mild housework like sweeping the floor. He often has to stop and rest, and then resume his work. Last night while sitting at rest had 10/10 tightness extending from throat into midchest with associated diaphoresis and SOB. Not positional. Would come and go all night long. He also reports recent wheezing, chronic cough.    Echo 2012: LVEF 55-60%, grade I diastolic dysfunction,  2012 Nuclear stress: no evidence of ischemia D-dimer 0.87, BNP 361, WBC 10.9, Plt 124, Hgb 17, K 3.5, Cr 0.87,  Trop 0.04-->0.03-->0.03 CT PE: no PE, mild coronary calcification CXR chronic bronchitic changes EKG SR, LAFB, anteroseptal and anterior Qwaves, lateral TWIs (Q-waves and TWIs appear new compared to 2012 EKG)    No Known Allergies  Medications Scheduled Medications: . antiseptic oral rinse  7 mL Mouth Rinse BID  . aspirin EC  81 mg Oral Daily  . atorvastatin  40 mg Oral Daily  . enoxaparin (LOVENOX) injection  40 mg Subcutaneous Q24H  . insulin aspart  0-9 Units Subcutaneous TID WC  . ipratropium-albuterol  3 mL Nebulization Q6H  . levothyroxine  50 mcg Oral QAC breakfast  . methylPREDNISolone (SOLU-MEDROL) injection  60 mg Intravenous Q6H  . metoprolol succinate  25 mg Oral Daily  . pantoprazole  40 mg Oral Daily  . sodium chloride flush  3 mL Intravenous Q12H  . triamterene-hydrochlorothiazide  1 tablet Oral Daily     Infusions: . sodium chloride 10 mL/hr at 03/13/16 0347     PRN Medications:  ondansetron  **OR** ondansetron (ZOFRAN) IV   Past Medical History  Diagnosis Date  . Coronary artery disease     Details unclear  . Essential hypertension, benign   . GERD (gastroesophageal reflux disease)   . Arthritis     Hips and knees  . MI (myocardial infarction) (HCC)     States "heart attack" 2/95 in prison in [place]  . Peptic ulcer     GIB 1994  . Obesity   . Stroke [facility])     States "stroke" with left sided weakness 5/95 in prison in [place]  . Hemorrhoids   . Back pain   . Hyperlipidemia   . Thyroid disease     Past Surgical History  Procedure Laterality Date  . Knee arthroscopy      Right knee  . Colonoscopy  03/23/2011    hemorrhoids  . Hip arthroscopy      Family History  Problem Relation Age of Onset  . Hypertension Mother   . Diabetes Mother   . Heart disease Mother   . Stroke Mother   . Heart attack Brother   . Aneurysm Brother   . Cirrhosis Brother   . Stroke Father     Social History Mr. Obara reports that he has been smoking Cigarettes.  He has been smoking about 1.00 pack per day. He has never used smokeless tobacco. Mr. Repass reports that he does not drink alcohol.  Review of Systems CONSTITUTIONAL: No weight loss, fever, chills, weakness or fatigue.  HEENT: Eyes: No visual loss, blurred vision,  double vision or yellow sclerae. No hearing loss, sneezing, congestion, runny nose or sore throat.  SKIN: No rash or itching.  CARDIOVASCULAR: per HPI RESPIRATORY: per HPI GASTROINTESTINAL: No anorexia, nausea, vomiting or diarrhea. No abdominal pain or blood.  GENITOURINARY: no polyuria, no dysuria NEUROLOGICAL: No headache, dizziness, syncope, paralysis, ataxia, numbness or tingling in the extremities. No change in bowel or bladder control.  MUSCULOSKELETAL: No muscle, back pain, joint pain or stiffness.  HEMATOLOGIC: No anemia, bleeding or bruising.  LYMPHATICS: No enlarged nodes. No history of splenectomy.  PSYCHIATRIC: No history of depression or anxiety.        Physical Examination Blood pressure 120/74, pulse 90, temperature 97.7 F (36.5 C), temperature source Oral, resp. rate 20, height 5\' 11"  (1.803 m), weight 260 lb 2.3 oz (118 kg), SpO2 95 %.  Intake/Output Summary (Last 24 hours) at 03/13/16 1009 Last data filed at 03/13/16 0908  Gross per 24 hour  Intake   1240 ml  Output   1300 ml  Net    -60 ml    HEENT: sclera clear, throat clear  Cardiovascular: RRR, no m/r/g, no jvd  Respiratory: bilateral wheezing.   GI: abdomen soft, NT, ND  MSK: trace bilateral edema  Neuro: no focal deficits  Psych: appropriate affect   Lab Results  Basic Metabolic Panel:  Recent Labs Lab 03/12/16 2310 03/13/16 0420  NA 137 136  K 3.5 3.7  CL 102 105  CO2 25 26  GLUCOSE 209* 200*  BUN 18 12  CREATININE 0.87 0.62  CALCIUM 8.7* 8.4*    Liver Function Tests:  Recent Labs Lab 03/13/16 0420  AST 21  ALT 20  ALKPHOS 100  BILITOT 0.7  PROT 6.7  ALBUMIN 3.6    CBC:  Recent Labs Lab 03/12/16 2310 03/13/16 0420  WBC 10.9* 13.7*  HGB 17.0 15.3  HCT 49.4 46.3  MCV 98.0 97.7  PLT 124* 115*    Cardiac Enzymes:  Recent Labs Lab 03/12/16 2310 03/13/16 0420 03/13/16 0909  TROPONINI 0.04* 0.03* 0.03*    BNP: Invalid input(s): POCBNP      Impression/Recommendations  1. Chest pain - clearly some component of COPD exacerbation is playing some role. However, his EKG shows new Qwaves in anterior leads as well as new TWIs lateral leads compared to 2012 study and he has a mild troponin elevation - self reported history of CAD, he reports medically managed MI in 2005 while in the prison system. +tobacco, HL,HTN, history of brother with MI  - concern for possible cardiac ischemia given symptoms, EKG changes, and mild troponin. Clinical picture is clouded by COPD exacerbation. F/u echo results, will consider noninvasive vs invasive ischemic testing.  2. COPD exacerbation - management per primary team.     Dina RichJonathan Lahoma Constantin, M.D.    1255pm Addendum  Echo shows LVEF 30-35%, anterior, anteoseptal, and apical wall motion abnormalities. Drop in LVEF since 2012 echo with new WMA coinciding with new anteroseptal and anterior qwaves on EKG and lateral TWIs. In setting of findings and symptoms plan [facility]HC. Given his significant COPD exacerbation likely will need to be done after the holiday tomorrow, we will ask patient be transferred to medicine team with cardiology following. Will start heparin gtt.Continue ASA, atorva, Toprl XL. Will start low dose entresto.    Dominga FerryJ Dezi Brauner MD   330pm addendum Discussed echo findings with primary team, they will arrange [facility] likely tomorrow [facility]. They are to alert the cardiology service  there when transferred to resume rounds on patient. Would anticipate likely RHC/LHC on Wednesday pending the status of his COPD exacerbation.   Dominga FerryJ Caycee Wanat MD

## 2016-03-13 NOTE — Progress Notes (Signed)
ANTICOAGULATION CONSULT NOTE -  Pharmacy Consult for HEPARIN Indication: chest pain/ACS  No Known Allergies  Patient Measurements: Height: 5\' 11"  (180.3 cm) Weight: 260 lb 2.3 oz (118 kg) IBW/kg (Calculated) : 75.3 HEPARIN DW (KG): 101.3  Vital Signs: Temp: 98.1 F (36.7 C) (07/03 2150) Temp Source: Oral (07/03 2150) BP: 120/66 mmHg (07/03 2150) Pulse Rate: 94 (07/03 2150)  Labs:  Recent Labs  03/12/16 2310 03/13/16 0420 03/13/16 0909 03/13/16 1520 03/13/16 1940  HGB 17.0 15.3  --   --   --   HCT 49.4 46.3  --   --   --   PLT 124* 115*  --   --   --   HEPARINUNFRC  --   --   --   --  0.13*  CREATININE 0.87 0.62  --   --   --   TROPONINI 0.04* 0.03* 0.03* <0.03  --    Estimated Creatinine Clearance: 121.9 mL/min (by C-G formula based on Cr of 0.62).  Medical History: Past Medical History  Diagnosis Date  . Coronary artery disease     Details unclear  . Essential hypertension, benign   . GERD (gastroesophageal reflux disease)   . Arthritis     Hips and knees  . MI (myocardial infarction) (HCC)     States "heart attack" [place]  . Peptic ulcer     GIB 1994  . Obesity   . Stroke [facility](HCC)     States "stroke" with left sided weakness 5/95 in [place] in [place]  . Hemorrhoids   . Back pain   . Hyperlipidemia   . Thyroid disease    Medications:  Facility-administered medications prior to admission  Medication Dose Route Frequency Provider Last Rate Last Dose  . 0.9 %  sodium chloride infusion  500 mL Intravenous Continuous Iva Booparl E Gessner, MD       Prescriptions prior to admission  Medication Sig Dispense Refill Last Dose  . acetaminophen (TYLENOL) 650 MG CR tablet Take one by mouth twice daily    unknown  . aspirin 81 MG tablet Take 81 mg by mouth daily.     Past Week at Unknown time  . atorvastatin (LIPITOR) 40 MG tablet Take 40 mg by mouth daily.     Past Week at Unknown time  . Cyanocobalamin (VITAMIN B-12) 2500 MCG SUBL Take one by mouth twice  daily     Past Week at Unknown time  . levothyroxine (SYNTHROID) 100 MCG tablet Take 100 mcg by mouth daily before breakfast.    Past Week at Unknown time  . metoprolol succinate (TOPROL-XL) 25 MG 24 hr tablet Take 25 mg by mouth daily.   Past Week at Unknown time  . Omega-3 Fatty Acids (FISH OIL) 1200 MG CAPS Take one by mouth twice daily     Past Week at Unknown time  . omeprazole (PRILOSEC) 20 MG capsule Take 20 mg by mouth daily.     Past Week at Unknown time  . pravastatin (PRAVACHOL) 40 MG tablet Take 40 mg by mouth daily.   Past Week at Unknown time  . triamterene-hydrochlorothiazide (MAXZIDE-25) 37.5-25 MG tablet Take 1 tablet by mouth daily.    Past Week at Unknown time  . vardenafil (LEVITRA) 20 MG tablet Take 20 mg by mouth daily as needed.     unknown   Assessment: 65YO OBESE MALE with self reported h/o MI in May 2005.  Pt admitted with chest pain. Asked to initiate Heparin for  ACS.  Baseline labs reviewed, Platelets on low end.  Pt not on blood thinners PTA (none listed on PTA med list).  Heparin level below goal.  Goal of Therapy:  Heparin level 0.3-0.7 units/ml Monitor platelets by anticoagulation protocol: Yes   Plan:  Heparin 2000 units IV bolus now x 1 IncreaseHeparin infusion to 1500 units/hr Check heparin level in 6-8 hrs then daily CBC daily while on Heparin  Valrie HartHall, Amelia Burgard A 03/13/2016,11:05 PM

## 2016-03-13 NOTE — H&P (Signed)
TRH H&P   Patient Demographics:    Bruce Barrera, is a 65 y.o. male  MRN: [id number]   DOB - 10-Dec-1950  Admit Date - 03/12/2016  Outpatient Primary MD for the patient is Josue Hector, MD  Referring MD/NP/PA: Dr Ward  Patient coming from:  Home   Chief Complaint  Patient presents with  . Chest Pain      HPI:    Bruce Barrera  is a 65 y.o. male, With history of hypertension, hyperlipidemia, CAD, COPD on home O2 at bedtime can to the hospital with chest tightness which started around 6 PM tonight. Patient says that he was able to move if and he felt his throat was closing. He denies chest pain. In the ED patient underwent CT angiogram which was negative for pulmonary embolism. Also had mild elevation of troponin 0.07, with EKG showing T-wave inversions in anterior chest leads    Review of systems:    In addition to the HPI above, * No Fever-chills, No Headache, No changes with Vision or hearing, No problems swallowing food or Liquids, No Abdominal pain, No Nausea or Vommitting, Bowel movements are regular, No Blood in stool or Urine, No dysuria, No new skin rashes or bruises, No new joints pains-aches,  No new weakness, tingling, numbness in any extremity, No recent weight gain or loss, No polyuria, polydypsia or polyphagia, No significant Mental Stressors.  A full 10 point Review of Systems was done, except as stated above, all other Review of Systems were negative.   With Past History of the following :    Past Medical History  Diagnosis Date  . Coronary artery disease     Details unclear  . Essential hypertension, benign   . GERD (gastroesophageal reflux disease)   . Arthritis     Hips and knees  . MI (myocardial infarction) (HCC)     States "heart attack" 2/95 in prison in [place]  . Peptic ulcer     GIB 1994  . Obesity   . Stroke [facility])     States "stroke" with left  sided weakness 5/95 in prison in [place]  . Hemorrhoids   . Back pain   . Hyperlipidemia   . Thyroid disease       Past Surgical History  Procedure Laterality Date  . Knee arthroscopy      Right knee  . Colonoscopy  03/23/2011    hemorrhoids  . Hip arthroscopy        Social History:     Social History  Substance Use Topics  . Smoking status: Current Every Day Smoker -- 1.00 packs/day    Types: Cigarettes  . Smokeless tobacco: Never Used  . Alcohol Use: No        Family History :     Family History  Problem Relation Age of Onset  . Hypertension Mother   . Diabetes Mother   . Heart disease Mother   . Stroke Mother   . Heart attack Brother   . Aneurysm Brother   .  Cirrhosis Brother   . Stroke Father       Home Medications:   Prior to Admission medications   Medication Sig Start Date End Date Taking? Authorizing Provider  aspirin 81 MG tablet Take 81 mg by mouth daily.     Yes Historical Provider, MD  atorvastatin (LIPITOR) 40 MG tablet Take 40 mg by mouth daily.     Yes Historical Provider, MD  Cyanocobalamin (VITAMIN B-12) 2500 MCG SUBL Take one by mouth twice daily     Yes Historical Provider, MD  levothyroxine (SYNTHROID, LEVOTHROID) 100 MCG tablet Take 1/2 by mouth daily     Yes Historical Provider, MD  metoprolol succinate (TOPROL-XL) 25 MG 24 hr tablet Take 25 mg by mouth daily.   Yes Historical Provider, MD  Omega-3 Fatty Acids (FISH OIL) 1200 MG CAPS Take one by mouth twice daily     Yes Historical Provider, MD  omeprazole (PRILOSEC) 20 MG capsule Take 20 mg by mouth daily.     Yes Historical Provider, MD  pravastatin (PRAVACHOL) 40 MG tablet Take 40 mg by mouth daily.   Yes Historical Provider, MD  triamterene-hydrochlorothiazide (MAXZIDE) 75-50 MG per tablet Take 1 tablet by mouth daily.     Yes Historical Provider, MD  acetaminophen (TYLENOL) 650 MG CR tablet Take one by mouth twice daily     Historical Provider, MD  celecoxib (CELEBREX) 200 MG capsule  Take 200 mg by mouth daily.      Historical Provider, MD  vardenafil (LEVITRA) 20 MG tablet Take 20 mg by mouth daily as needed.      Historical Provider, MD     Allergies:    No Known Allergies   Physical Exam:   Vitals  Blood pressure 115/69, pulse 106, temperature 98.2 F (36.8 C), temperature source Oral, resp. rate 13, SpO2 99 %.   1. General  Caucasian male lying in bed in NAD, cooperative with exam  2. Normal affect and insight, Not Suicidal or Homicidal, Awake Alert, Oriented X 3.  3. No F.N deficits, ALL C.Nerves Intact, Strength 5/5 all 4 extremities, Sensation intact all 4 extremities, Plantars down going.  4. Ears and Eyes appear Normal, Conjunctivae clear, PERRLA. Moist Oral Mucosa.  5. Supple Neck, No JVD, No cervical lymphadenopathy appriciated, No Carotid Bruits. Bilateral 1+ pitting edema of the extremities  6. Symmetrical Chest wall movement, decreased breath sounds bilaterally  7. RRR, No Gallops, Rubs or Murmurs, No Parasternal Heave.  8. Positive Bowel Sounds, Abdomen Soft, No tenderness, No organomegaly appriciated,No rebound -guarding or rigidity.  9.  No Cyanosis, Normal Skin Turgor, No Skin Rash or Bruise.  10. Good muscle tone,  joints appear normal , no effusions, Normal ROM.      Data Review:    CBC  Recent Labs Lab 03/12/16 2310  WBC 10.9*  HGB 17.0  HCT 49.4  PLT 124*  MCV 98.0  MCH 33.7  MCHC 34.4  RDW 14.5   ------------------------------------------------------------------------------------------------------------------  Chemistries   Recent Labs Lab 03/12/16 2310  NA 137  K 3.5  CL 102  CO2 25  GLUCOSE 209*  BUN 18  CREATININE 0.87  CALCIUM 8.7*    -------------------------------------------------------------------------------------------------------------------  Recent Labs  03/12/16 2310  DDIMER 0.87*    -------------------------------------------------------------------------------------------------------------------  Cardiac Enzymes  Recent Labs Lab 03/12/16 2310  TROPONINI 0.04*   ------------------------------------------------------------------------------------------------------------------    Component Value Date/Time   BNP 361.0* 03/12/2016 2310     ---------------------------------------------------------------------------------------------------------------  Urinalysis No results found for: COLORURINE, APPEARANCEUR, LABSPEC, PHURINE,  GLUCOSEU, HGBUR, BILIRUBINUR, KETONESUR, PROTEINUR, UROBILINOGEN, NITRITE, LEUKOCYTESUR  ----------------------------------------------------------------------------------------------------------------   Imaging Results:    Dg Chest 2 View  03/13/2016  CLINICAL DATA:  Chest pain for 5 hours. Shortness of breath, nausea and diaphoresis. EXAM: CHEST  2 VIEW COMPARISON:  Chest radiograph 09/30/2013 FINDINGS: Heart size and mediastinal contours are unchanged. Bronchitic markings appear chronic. No focal airspace disease, pulmonary edema, pneumothorax or pleural effusion. There is degenerative change in the lower thoracic and upper lumbar spine. IMPRESSION: Chronic bronchitic change.  No acute process. Electronically Signed   By: Rubye OaksMelanie  Ehinger M.D.   On: 03/13/2016 00:23   Ct Angio Chest Pe W/cm &/or Wo Cm  03/13/2016  CLINICAL DATA:  Chest pain and shortness of breath since last night. Elevated D-dimer. EXAM: CT ANGIOGRAPHY CHEST WITH CONTRAST TECHNIQUE: Multidetector CT imaging of the chest was performed using the standard protocol during bolus administration of intravenous contrast. Multiplanar CT image reconstructions and MIPs were obtained to evaluate the vascular anatomy. CONTRAST:  100 cc Isovue 370 IV COMPARISON:  Radiographs 2 hours prior. Radiograph 09/30/2013 also reviewed. FINDINGS: Cardiovascular: There are no filling defects within  the pulmonary arteries to suggest pulmonary embolus. Minimal atherosclerosis of the aortic arch. No aortic dissection or aneurysm. Mild distal aortic tortuosity. There are coronary artery calcifications. Mediastinum/Nodes: Prominent right upper paratracheal node measures 9 mm short axis, borderline. No additional mediastinal or hilar adenopathy. No pericardial fluid. Lungs/Pleura: Central bronchial thickening most prominent in the lower lobes. Minimal dependent atelectasis. Breathing motion artifact obscures dependent left lower lobe evaluation. No consolidation to suggest pneumonia. No pulmonary mass. No suspicious nodule allowing for breathing motion. Upper Abdomen: No acute abnormality. Musculoskeletal: Multilevel degenerative change throughout the spine. There are no acute or suspicious osseous abnormalities. Review of the MIP images confirms the above findings. IMPRESSION: 1. No pulmonary embolus. 2. Bronchial thickening, progressed from prior radiograph. 3. Mild aortic atherosclerosis.  Coronary artery calcifications. Electronically Signed   By: Rubye OaksMelanie  Ehinger M.D.   On: 03/13/2016 01:52    My personal review of EKG: Sinus rhythm, T-wave inversions in anterior chest leads   Assessment & Plan:    Active Problems:   Coronary atherosclerosis of native coronary artery   Essential hypertension, benign   Chest pain   COPD exacerbation (HCC)     1. COPD exacerbation- start Solu-Medrol 60 mg IV every 6 hours, DuoNeb every 6 hours 2. Elevated troponin- has mild elevation of troponin 0.04, EKG showing T-wave inversions in the anterior chest leads V 2-6, follow serial troponin and obtain Cardiologic consultation in am. 3. Hypertension- continue metoprolol, Maxzide 4. Hypothyroidism- continue Synthroid   DVT Prophylaxis-   Lovenox   AM Labs Ordered, also please review Full Orders  Family Communication:   Code Status:  DO NOT RESUSCITATE  Admission status: Observation  Time spent in  minutes : 60 min   Shannah Conteh S M.D on 03/13/2016 at 2:56 AM  Between 7am to 7pm - Pager - [phone]. After 7pm go to [email or web address] - password [email or web address]  [facility] - Office  [phone]

## 2016-03-13 NOTE — ED Notes (Signed)
CRITICAL VALUE ALERT  Critical value received:  Troponin 0.04  Date of notification:  03/13/16  Time of notification:  0036 hrs  Critical value read back:Yes.    Nurse who received alert:  Y. Amee Boothe, RN  MD notified (1st page):  0037 hrs  Responding MD:  Dr. Elesa MassedWard  Time MD responded:  0038 hrs

## 2016-03-13 NOTE — Progress Notes (Addendum)
ANTICOAGULATION CONSULT NOTE - Initial Consult  Pharmacy Consult for HEPARIN Indication: chest pain/ACS  No Known Allergies  Patient Measurements: Height: 5\' 11"  (180.3 cm) Weight: 260 lb 2.3 oz (118 kg) IBW/kg (Calculated) : 75.3 HEPARIN DW (KG): 101.3  Vital Signs: Temp: 97.7 F (36.5 C) (07/03 0907) Temp Source: Oral (07/03 0907) BP: 120/74 mmHg (07/03 0907) Pulse Rate: 90 (07/03 0907)  Labs:  Recent Labs  03/12/16 2310 03/13/16 0420 03/13/16 0909  HGB 17.0 15.3  --   HCT 49.4 46.3  --   PLT 124* 115*  --   CREATININE 0.87 0.62  --   TROPONINI 0.04* 0.03* 0.03*   Estimated Creatinine Clearance: 121.9 mL/min (by C-G formula based on Cr of 0.62).  Medical History: Past Medical History  Diagnosis Date  . Coronary artery disease     Details unclear  . Essential hypertension, benign   . GERD (gastroesophageal reflux disease)   . Arthritis     Hips and knees  . MI (myocardial infarction) (HCC)     States "heart attack" 2/95 in [place] in [place]  . Peptic ulcer     GIB 1994  . Obesity   . Stroke [facility](HCC)     States "stroke" with left sided weakness 5/95 in [place] in [place]  . Hemorrhoids   . Back pain   . Hyperlipidemia   . Thyroid disease    Medications:  Facility-administered medications prior to admission  Medication Dose Route Frequency Provider Last Rate Last Dose  . 0.9 %  sodium chloride infusion  500 mL Intravenous Continuous Iva Booparl E Gessner, MD       Prescriptions prior to admission  Medication Sig Dispense Refill Last Dose  . acetaminophen (TYLENOL) 650 MG CR tablet Take one by mouth twice daily    unknown  . aspirin 81 MG tablet Take 81 mg by mouth daily.     Past Week at Unknown time  . atorvastatin (LIPITOR) 40 MG tablet Take 40 mg by mouth daily.     Past Week at Unknown time  . Cyanocobalamin (VITAMIN B-12) 2500 MCG SUBL Take one by mouth twice daily     Past Week at Unknown time  . levothyroxine (SYNTHROID) 100 MCG tablet Take 100 mcg by mouth daily  before breakfast.    Past Week at Unknown time  . metoprolol succinate (TOPROL-XL) 25 MG 24 hr tablet Take 25 mg by mouth daily.   Past Week at Unknown time  . Omega-3 Fatty Acids (FISH OIL) 1200 MG CAPS Take one by mouth twice daily     Past Week at Unknown time  . omeprazole (PRILOSEC) 20 MG capsule Take 20 mg by mouth daily.     Past Week at Unknown time  . pravastatin (PRAVACHOL) 40 MG tablet Take 40 mg by mouth daily.   Past Week at Unknown time  . triamterene-hydrochlorothiazide (MAXZIDE-25) 37.5-25 MG tablet Take 1 tablet by mouth daily.    Past Week at Unknown time  . vardenafil (LEVITRA) 20 MG tablet Take 20 mg by mouth daily as needed.     unknown   Assessment: 65YO OBESE MALE with self reported h/o MI in May 2005.  Pt admitted with chest pain. Asked to initiate Heparin for ACS.  Baseline labs reviewed, Platelets on low end.  Pt not on blood thinners PTA (none listed on PTA med list)  Goal of Therapy:  Heparin level 0.3-0.7 units/ml Monitor platelets by anticoagulation protocol: Yes   Plan:  Heparin 4000 units IV  bolus now x 1 Heparin infusion at 1200 units/hr Check heparin level in 6-8 hrs then daily CBC daily while on Heparin  Valrie HartHall, Deano Tomaszewski A 03/13/2016,1:04 PM

## 2016-03-13 NOTE — Progress Notes (Signed)
*  PRELIMINARY RESULTS* Echocardiogram 2D Echocardiogram has been performed.  Bruce Barrera, Berneda Piccininni 03/13/2016, 11:37 AM

## 2016-03-13 NOTE — Care Management Note (Signed)
Case Management Note  Patient Details  Name: Bruce SabinJohn Barrera MRN: [id number] Date of Birth: [birth year]  Subjective/Objective:    Patient is from home, independent with ADL's. Has PCP and reports no issues with getting medications.  [facility]               Action/Plan: Anticipate d/c home with self care. No CM needs.    Expected Discharge Date:  03/15/16               Expected Discharge Plan:  Home/Self Care  In-House Referral:  NA  Discharge planning Services  CM Consult  Post Acute Care Choice:  NA Choice offered to:  NA  DME Arranged:    DME Agency:     HH Arranged:    HH Agency:     Status of Service:  Completed, signed off  If discussed at [employer] Length of Stay Meetings, dates discussed:    Additional Comments:  Bruce Barrera, Bruce OilerSharley Diane, RN 03/13/2016, 2:56 PM

## 2016-03-13 NOTE — Progress Notes (Signed)
Patient seen and examined  Bruce Barrera is a 65 y.o. male, With history of hypertension, hyperlipidemia, CAD, COPD on home O2 at bedtime can to the hospital with chest tightness which started around 6 PM tonight. Patient says that he was able to move if and he felt his throat was closing. He denies chest pain. In the ED patient underwent CT angiogram which was negative for pulmonary embolism. Also had mild elevation of troponin 0.07, with EKG showing T-wave inversions in anterior chest leads  Assessment and plan 1. COPD exacerbation- start Solu-Medrol 60 mg IV every 6 hours, DuoNeb every 6 hours, no pneumonia on CT, no PE 2. Elevated troponin- has mild elevation of troponin 0.04, EKG showing T-wave inversions in the anterior chest leads V 2-6, follow serial troponin and obtain Cardiologic consultation in am. Patient is an active smoker, multiple cardiovascular risk factors, 2-D echo to rule out wall motion abnormalities 3. Hypertension- continue metoprolol, Maxzide 4. Hypothyroidism- continue Synthroid 5. Hyperglycemia-suspect patient is also diabetic, check hemoglobin A1c and start the patient on sliding scale insulin 6. Abnormal d-dimer-venous Doppler to rule out DVT

## 2016-03-14 DIAGNOSIS — J441 Chronic obstructive pulmonary disease with (acute) exacerbation: Secondary | ICD-10-CM

## 2016-03-14 DIAGNOSIS — R072 Precordial pain: Secondary | ICD-10-CM

## 2016-03-14 DIAGNOSIS — I10 Essential (primary) hypertension: Secondary | ICD-10-CM

## 2016-03-14 DIAGNOSIS — I209 Angina pectoris, unspecified: Secondary | ICD-10-CM

## 2016-03-14 LAB — COMPREHENSIVE METABOLIC PANEL
ALT: 20 U/L (ref 17–63)
AST: 18 U/L (ref 15–41)
Albumin: 3.4 g/dL — ABNORMAL LOW (ref 3.5–5.0)
Alkaline Phosphatase: 85 U/L (ref 38–126)
Anion gap: 9 (ref 5–15)
BUN: 15 mg/dL (ref 6–20)
CO2: 25 mmol/L (ref 22–32)
Calcium: 8.9 mg/dL (ref 8.9–10.3)
Chloride: 102 mmol/L (ref 101–111)
Creatinine, Ser: 0.73 mg/dL (ref 0.61–1.24)
GFR calc Af Amer: >60 mL/min (ref >60)
GFR calc non Af Amer: >60 mL/min (ref >60)
Glucose, Bld: 207 mg/dL — ABNORMAL HIGH (ref 65–99)
Potassium: 3.4 mmol/L — ABNORMAL LOW (ref 3.5–5.1)
Sodium: 136 mmol/L (ref 135–145)
Total Bilirubin: 0.4 mg/dL (ref 0.3–1.2)
Total Protein: 6.8 g/dL (ref 6.5–8.1)

## 2016-03-14 LAB — CBC
HCT: 46.7 % (ref 39.0–52.0)
Hemoglobin: 15.6 g/dL (ref 13.0–17.0)
MCH: 32.9 pg (ref 26.0–34.0)
MCHC: 33.4 g/dL (ref 30.0–36.0)
MCV: 98.5 fL (ref 78.0–100.0)
Platelets: 135 10*3/uL — ABNORMAL LOW (ref 150–400)
RBC: 4.74 MIL/uL (ref 4.22–5.81)
RDW: 14.7 % (ref 11.5–15.5)
WBC: 21.6 10*3/uL — ABNORMAL HIGH (ref 4.0–10.5)

## 2016-03-14 LAB — HEPARIN LEVEL (UNFRACTIONATED)
Heparin Unfractionated: 0.2 IU/mL — ABNORMAL LOW (ref 0.30–0.70)
Heparin Unfractionated: 0.62 IU/mL (ref 0.30–0.70)
Heparin Unfractionated: 0.54 IU/mL (ref 0.30–0.70)

## 2016-03-14 LAB — HEMOGLOBIN A1C
Hgb A1c MFr Bld: 6.3 % — ABNORMAL HIGH (ref 4.8–5.6)
Mean Plasma Glucose: 134 mg/dL

## 2016-03-14 LAB — GLUCOSE, CAPILLARY
Glucose-Capillary: 216 mg/dL — ABNORMAL HIGH (ref 65–99)
Glucose-Capillary: 210 mg/dL — ABNORMAL HIGH (ref 65–99)
Glucose-Capillary: 244 mg/dL — ABNORMAL HIGH (ref 65–99)

## 2016-03-14 MED ORDER — SODIUM CHLORIDE 0.9 % IV SOLN: 250.0000 mL | INTRAVENOUS | Status: DC | PRN

## 2016-03-14 MED ORDER — ASPIRIN 81 MG PO CHEW
81.0000 mg | CHEWABLE_TABLET | ORAL | Status: AC
  Administered 2016-03-15: 81 mg via ORAL
  Filled 2016-03-14: qty 1

## 2016-03-14 MED ORDER — IPRATROPIUM-ALBUTEROL 0.5-2.5 (3) MG/3ML IN SOLN: 3.0000 mL | RESPIRATORY_TRACT | Status: DC | PRN

## 2016-03-14 MED ORDER — SODIUM CHLORIDE 0.9% FLUSH
3.0000 mL | Freq: Two times a day (BID) | INTRAVENOUS | Status: DC
  Administered 2016-03-14 – 2016-03-15 (×2): 3 mL via INTRAVENOUS

## 2016-03-14 MED ORDER — METHYLPREDNISOLONE SODIUM SUCC 40 MG IJ SOLR
40.0000 mg | Freq: Four times a day (QID) | INTRAMUSCULAR | Status: DC
Start: 2016-03-14 — End: 2016-03-15
  Administered 2016-03-14 – 2016-03-15 (×4): 40 mg via INTRAVENOUS
  Filled 2016-03-14 (×4): qty 1

## 2016-03-14 MED ORDER — SODIUM CHLORIDE 0.9 % IV SOLN
INTRAVENOUS | Status: DC
Start: 2016-03-15 — End: 2016-03-15

## 2016-03-14 MED ORDER — SODIUM CHLORIDE 0.9% FLUSH: 3.0000 mL | INTRAVENOUS | Status: DC | PRN

## 2016-03-14 MED ORDER — HEPARIN BOLUS VIA INFUSION
2000.0000 [IU] | Freq: Once | INTRAVENOUS | Status: AC
  Administered 2016-03-14: 2000 [IU] via INTRAVENOUS
  Filled 2016-03-14: qty 2000

## 2016-03-14 NOTE — Progress Notes (Addendum)
ANTICOAGULATION CONSULT NOTE - Follow Up Consult  Pharmacy Consult for heparin Indication: chest pain/ACS  No Known Allergies  Patient Measurements: Height: 5' 11.25" (181 cm) Weight: 251 lb 5.2 oz (114 kg) (scale C) IBW/kg (Calculated) : 75.88 Heparin Dosing Weight: 100 kg  Vital Signs: Temp: 97.8 F (36.6 C) (07/04 1154) Temp Source: Oral (07/04 1154) BP: 108/67 mmHg (07/04 1154) Pulse Rate: 79 (07/04 1154)  Labs:  Recent Labs  03/12/16 2310 03/13/16 0420 03/13/16 0909 03/13/16 1520 03/13/16 1940 03/14/16 0416 03/14/16 1344  HGB 17.0 15.3  --   --   --  15.6  --   HCT 49.4 46.3  --   --   --  46.7  --   PLT 124* 115*  --   --   --  135*  --   HEPARINUNFRC  --   --   --   --  0.13* 0.20* 0.62  CREATININE 0.87 0.62  --   --   --  0.73  --   TROPONINI 0.04* 0.03* 0.03* <0.03  --   --   --     Estimated Creatinine Clearance: 120.2 mL/min (by C-G formula based on Cr of 0.73).   Medications:  Facility-administered medications prior to admission  Medication Dose Route Frequency Provider Last Rate Last Dose  . 0.9 %  sodium chloride infusion  500 mL Intravenous Continuous Iva Booparl E Gessner, MD       Prescriptions prior to admission  Medication Sig Dispense Refill Last Dose  . acetaminophen (TYLENOL) 650 MG CR tablet Take one by mouth twice daily    unknown  . aspirin 81 MG tablet Take 81 mg by mouth daily.     Past Week at Unknown time  . atorvastatin (LIPITOR) 40 MG tablet Take 40 mg by mouth daily.     Past Week at Unknown time  . Cyanocobalamin (VITAMIN B-12) 2500 MCG SUBL Take one by mouth twice daily     Past Week at Unknown time  . levothyroxine (SYNTHROID) 100 MCG tablet Take 100 mcg by mouth daily before breakfast.    Past Week at Unknown time  . metoprolol succinate (TOPROL-XL) 25 MG 24 hr tablet Take 25 mg by mouth daily.   Past Week at Unknown time  . Omega-3 Fatty Acids (FISH OIL) 1200 MG CAPS Take one by mouth twice daily     Past Week at Unknown time  .  omeprazole (PRILOSEC) 20 MG capsule Take 20 mg by mouth daily.     Past Week at Unknown time  . pravastatin (PRAVACHOL) 40 MG tablet Take 40 mg by mouth daily.   Past Week at Unknown time  . triamterene-hydrochlorothiazide (MAXZIDE-25) 37.5-25 MG tablet Take 1 tablet by mouth daily.    Past Week at Unknown time  . vardenafil (LEVITRA) 20 MG tablet Take 20 mg by mouth daily as needed.     unknown    Assessment: 65 yo M [facility].  HL 0.62 (therapeutic), Hgb 15.6, Plt 135. No s/sx of bleeding noted.  Goal of Therapy:  Heparin level 0.3-0.7 units/ml Monitor platelets by anticoagulation protocol: Yes   Plan:  - Continue heparin 1800 units/hr - Check 6 hr HL - Monitor daily HL, CBC and s/sx of bleeding - F/u plans for cath  Casilda Carlsaylor Stone, PharmD. PGY-1 Pharmacy Resident Pager: [phone] 03/14/2016,2:42 PM   ADDENDUM: Confirmatory HL remains therapeutic at 0.54. Will continue current rate of 1800 units/hr. No bleeding noted.  Sherle Poeob Amelita Risinger, PharmD Clinical Pharmacist  8:56 PM, 03/14/2016

## 2016-03-14 NOTE — Progress Notes (Signed)
[facility] PROGRESS NOTE  Bruce SabinJohn Barrera ZOX:[id number] DOB: [birth year] DOA: 03/12/2016   PCP: Josue HectorNYLAND,LEONARD ROBERT, MD     Assessment/Plan: Active Problems:   Coronary atherosclerosis of native coronary artery   Essential hypertension, benign   Chest pain   COPD exacerbation (HCC)    Bruce SabinJohn Barrera is a 65 y.o. male, With history of hypertension, hyperlipidemia, CAD, COPD on home O2 at bedtime can to the hospital with chest tightness which started around 6 PM tonight. Patient says that he was able to move if and he felt his throat was closing. He denies chest pain. In the ED patient underwent CT angiogram which was negative for pulmonary embolism. Also had mild elevation of troponin 0.07, with EKG showing T-wave inversions in anterior chest leads  Assessment and plan 1. COPD exacerbation- continue Solu-Medrol , reduced dose from 60 to 40 mg IV every 6 hours, DuoNeb every 6 hours, no pneumonia on CT, no PE 2. Chest pain/Elevated troponin- has mild elevation of troponin 0.04, EKG showing T-wave inversions in the anterior chest leads V 2-6, follow serial troponin and obtain . Cardiology consultation obtained. They have concern for possible cardiac ischemia. Dr. branch [facility]. EF 30-35%. Patient is an active smoker, multiple cardiovascular risk factors, will keep   NPO  possible cardiac cath. Patient started on heparin drip by cardiology. Continue aspirin and statin. 3.  Hypertension- continue metoprolol, hold  Maxzide 4. Hypothyroidism- continue Synthroid 5. Hyperglycemia-suspect patient is also diabetic, check hemoglobin A1c and start the patient on sliding scale insulin 6. Abnormal d-dimer-venous Doppler negative for DVT    DVT prophylaxsis  heparin drip   Code Status:  DO NOT RESUSCITATE      Family Communication: Discussed in detail with the patient, all imaging results, lab results explained to the patient   Disposition  Plan:  [facility]       Consultants: Cardiology   Procedures-none        Antibiotics: Anti-infectives    None         HPI/Subjective: Patient chest pain-free this morning, denies any shortness of breath,   Objective: Filed Vitals:   03/13/16 2015 03/13/16 2150 03/14/16 0542 03/14/16 0723  BP:  120/66 115/74   Pulse:  94 86   Temp:  98.1 F (36.7 C) 97.8 F (36.6 C)   TempSrc:  Oral Oral   Resp:  20 20   Height:      Weight:      SpO2: 93% 97% 95% 93%    Intake/Output Summary (Last 24 hours) at 03/14/16 0825 Last data filed at 03/14/16 0543  Gross per 24 hour  Intake    480 ml  Output   2700 ml  Net  -2220 ml    Exam:  Examination:  General exam: Appears calm and comfortable  Respiratory system: Clear to auscultation. Respiratory effort normal. Cardiovascular system: S1 & S2 heard, RRR. No JVD, murmurs, rubs, gallops or clicks. No pedal edema. Gastrointestinal system: Abdomen is nondistended, soft and nontender. No organomegaly or masses felt. Normal bowel sounds heard. Central nervous system: Alert and oriented. No focal neurological deficits. Extremities: Symmetric 5 x 5 power. Skin: No rashes, lesions or ulcers Psychiatry: Judgement and insight appear normal. Mood & affect appropriate.     Data Reviewed: I have personally reviewed following labs and imaging studies  Micro Results No results found for this or any previous visit (  from the past 240 hour(s)).  Radiology Reports Dg Chest 2 View  03/13/2016  CLINICAL DATA:  Chest pain for 5 hours. Shortness of breath, nausea and diaphoresis. EXAM: CHEST  2 VIEW COMPARISON:  Chest radiograph 09/30/2013 FINDINGS: Heart size and mediastinal contours are unchanged. Bronchitic markings appear chronic. No focal airspace disease, pulmonary edema, pneumothorax or pleural effusion. There is degenerative change in the lower thoracic and upper lumbar spine. IMPRESSION:  Chronic bronchitic change.  No acute process. Electronically Signed   By: Rubye Oaks M.D.   On: 03/13/2016 00:23   Ct Angio Chest Pe W/cm &/or Wo Cm  03/13/2016  CLINICAL DATA:  Chest pain and shortness of breath since last night. Elevated D-dimer. EXAM: CT ANGIOGRAPHY CHEST WITH CONTRAST TECHNIQUE: Multidetector CT imaging of the chest was performed using the standard protocol during bolus administration of intravenous contrast. Multiplanar CT image reconstructions and MIPs were obtained to evaluate the vascular anatomy. CONTRAST:  100 cc Isovue 370 IV COMPARISON:  Radiographs 2 hours prior. Radiograph 09/30/2013 also reviewed. FINDINGS: Cardiovascular: There are no filling defects within the pulmonary arteries to suggest pulmonary embolus. Minimal atherosclerosis of the aortic arch. No aortic dissection or aneurysm. Mild distal aortic tortuosity. There are coronary artery calcifications. Mediastinum/Nodes: Prominent right upper paratracheal node measures 9 mm short axis, borderline. No additional mediastinal or hilar adenopathy. No pericardial fluid. Lungs/Pleura: Central bronchial thickening most prominent in the lower lobes. Minimal dependent atelectasis. Breathing motion artifact obscures dependent left lower lobe evaluation. No consolidation to suggest pneumonia. No pulmonary mass. No suspicious nodule allowing for breathing motion. Upper Abdomen: No acute abnormality. Musculoskeletal: Multilevel degenerative change throughout the spine. There are no acute or suspicious osseous abnormalities. Review of the MIP images confirms the above findings. IMPRESSION: 1. No pulmonary embolus. 2. Bronchial thickening, progressed from prior radiograph. 3. Mild aortic atherosclerosis.  Coronary artery calcifications. Electronically Signed   By: Rubye Oaks M.D.   On: 03/13/2016 01:52   US Venous Img Lower Bilateral  03/13/2016  CLINICAL DATA:  65 year old male with a history of lower extremity edema EXAM:  BILATERAL LOWER EXTREMITY VENOUS DOPPLER ULTRASOUND TECHNIQUE: Gray-scale sonography with graded compression, as well as color Doppler and duplex ultrasound were performed to evaluate the lower extremity deep venous systems from the level of the common femoral vein and including the common femoral, femoral, profunda femoral, popliteal and calf veins including the posterior tibial, peroneal and gastrocnemius veins when visible. The superficial great saphenous vein was also interrogated. Spectral Doppler was utilized to evaluate flow at rest and with distal augmentation maneuvers in the common femoral, femoral and popliteal veins. COMPARISON:  None. FINDINGS: RIGHT LOWER EXTREMITY Common Femoral Vein: No evidence of thrombus. Normal compressibility, respiratory phasicity and response to augmentation. Saphenofemoral Junction: No evidence of thrombus. Normal compressibility and flow on color Doppler imaging. Profunda Femoral Vein: No evidence of thrombus. Normal compressibility and flow on color Doppler imaging. Femoral Vein: No evidence of thrombus. Normal compressibility, respiratory phasicity and response to augmentation. Popliteal Vein: No evidence of thrombus. Normal compressibility, respiratory phasicity and response to augmentation. Calf Veins: No thrombus of the posterior tibial vein. Peroneal vein not visualized completely. Superficial Great Saphenous Vein: No evidence of thrombus. Normal compressibility and flow on color Doppler imaging. Other Findings:  Edema of the lower extremity. LEFT LOWER EXTREMITY Common Femoral Vein: No evidence of thrombus. Normal compressibility, respiratory phasicity and response to augmentation. Saphenofemoral Junction: No evidence of thrombus. Normal compressibility and flow on color Doppler imaging. Profunda Femoral Vein:  No evidence of thrombus. Normal compressibility and flow on color Doppler imaging. Femoral Vein: No evidence of thrombus. Normal compressibility, respiratory  phasicity and response to augmentation. Popliteal Vein: No evidence of thrombus. Normal compressibility, respiratory phasicity and response to augmentation. Calf Veins: No evidence of thrombus. Normal compressibility and flow on color Doppler imaging. Superficial Great Saphenous Vein: No evidence of thrombus. Normal compressibility and flow on color Doppler imaging. Other Findings:  Edema of lower extremity. IMPRESSION: Sonographic survey bilateral lower extremities negative for DVT. Bilateral lower extremity edema. Signed, Yvone Neu. Loreta Ave, DO [facility] Radiology Electronically Signed   By: Gilmer Mor D.O.   On: 03/13/2016 12:55     CBC  Recent Labs Lab 03/12/16 2310 03/13/16 0420 03/14/16 0416  WBC 10.9* 13.7* 21.6*  HGB 17.0 15.3 15.6  HCT 49.4 46.3 46.7  PLT 124* 115* 135*  MCV 98.0 97.7 98.5  MCH 33.7 32.3 32.9  MCHC 34.4 33.0 33.4  RDW 14.5 14.5 14.7    Chemistries   Recent Labs Lab 03/12/16 2310 03/13/16 0420 03/14/16 0416  NA 137 136 136  K 3.5 3.7 3.4*  CL 102 105 102  CO2 GLUCOSE 209* 200* 207*  BUN CREATININE 0.87 0.62 0.73  CALCIUM 8.7* 8.4* 8.9  AST  --  21 18  ALT  --  20 20  ALKPHOS  --  100 85  BILITOT  --  0.7 0.4   ------------------------------------------------------------------------------------------------------------------ estimated creatinine clearance is 121.9 mL/min (by C-G formula based on Cr of 0.73). ------------------------------------------------------------------------------------------------------------------  Recent Labs  03/13/16 0420  HGBA1C 6.3*   ------------------------------------------------------------------------------------------------------------------ No results for input(s): CHOL, HDL, LDLCALC, TRIG, CHOLHDL, LDLDIRECT in the last 72  hours. ------------------------------------------------------------------------------------------------------------------ No results for input(s): TSH, T4TOTAL, T3FREE, THYROIDAB in the last 72 hours.  Invalid input(s): FREET3 ------------------------------------------------------------------------------------------------------------------ No results for input(s): VITAMINB12, FOLATE, FERRITIN, TIBC, IRON, RETICCTPCT in the last 72 hours.  Coagulation profile No results for input(s): INR, PROTIME in the last 168 hours.   Recent Labs  03/12/16 2310  DDIMER 0.87*    Cardiac Enzymes  Recent Labs Lab 03/13/16 0420 03/13/16 0909 03/13/16 1520  TROPONINI 0.03* 0.03* <0.03   ------------------------------------------------------------------------------------------------------------------ Invalid input(s): POCBNP   CBG:  Recent Labs Lab 03/13/16 1242 03/13/16 1623 03/13/16 2149 03/14/16 0741  GLUCAP 187* 272* 239* 216*       Studies: Dg Chest 2 View  03/13/2016  CLINICAL DATA:  Chest pain for 5 hours. Shortness of breath, nausea and diaphoresis. EXAM: CHEST  2 VIEW COMPARISON:  Chest radiograph 09/30/2013 FINDINGS: Heart size and mediastinal contours are unchanged. Bronchitic markings appear chronic. No focal airspace disease, pulmonary edema, pneumothorax or pleural effusion. There is degenerative change in the lower thoracic and upper lumbar spine. IMPRESSION: Chronic bronchitic change.  No acute process. Electronically Signed   By: Rubye Oaks M.D.   On: 03/13/2016 00:23   Ct Angio Chest Pe W/cm &/or Wo Cm  03/13/2016  CLINICAL DATA:  Chest pain and shortness of breath since last night. Elevated D-dimer. EXAM: CT ANGIOGRAPHY CHEST WITH CONTRAST TECHNIQUE: Multidetector CT imaging of the chest was performed using the standard protocol during bolus administration of intravenous contrast. Multiplanar CT image reconstructions and MIPs were obtained to evaluate the vascular  anatomy. CONTRAST:  100 cc Isovue 370 IV COMPARISON:  Radiographs 2 hours prior. Radiograph 09/30/2013 also reviewed. FINDINGS: Cardiovascular: There are no filling defects within the pulmonary arteries to suggest pulmonary embolus. Minimal atherosclerosis  of the aortic arch. No aortic dissection or aneurysm. Mild distal aortic tortuosity. There are coronary artery calcifications. Mediastinum/Nodes: Prominent right upper paratracheal node measures 9 mm short axis, borderline. No additional mediastinal or hilar adenopathy. No pericardial fluid. Lungs/Pleura: Central bronchial thickening most prominent in the lower lobes. Minimal dependent atelectasis. Breathing motion artifact obscures dependent left lower lobe evaluation. No consolidation to suggest pneumonia. No pulmonary mass. No suspicious nodule allowing for breathing motion. Upper Abdomen: No acute abnormality. Musculoskeletal: Multilevel degenerative change throughout the spine. There are no acute or suspicious osseous abnormalities. Review of the MIP images confirms the above findings. IMPRESSION: 1. No pulmonary embolus. 2. Bronchial thickening, progressed from prior radiograph. 3. Mild aortic atherosclerosis.  Coronary artery calcifications. Electronically Signed   By: Rubye Oaks M.D.   On: 03/13/2016 01:52   US Venous Img Lower Bilateral  03/13/2016  CLINICAL DATA:  65 year old male with a history of lower extremity edema EXAM: BILATERAL LOWER EXTREMITY VENOUS DOPPLER ULTRASOUND TECHNIQUE: Gray-scale sonography with graded compression, as well as color Doppler and duplex ultrasound were performed to evaluate the lower extremity deep venous systems from the level of the common femoral vein and including the common femoral, femoral, profunda femoral, popliteal and calf veins including the posterior tibial, peroneal and gastrocnemius veins when visible. The superficial great saphenous vein was also interrogated. Spectral Doppler was utilized to  evaluate flow at rest and with distal augmentation maneuvers in the common femoral, femoral and popliteal veins. COMPARISON:  None. FINDINGS: RIGHT LOWER EXTREMITY Common Femoral Vein: No evidence of thrombus. Normal compressibility, respiratory phasicity and response to augmentation. Saphenofemoral Junction: No evidence of thrombus. Normal compressibility and flow on color Doppler imaging. Profunda Femoral Vein: No evidence of thrombus. Normal compressibility and flow on color Doppler imaging. Femoral Vein: No evidence of thrombus. Normal compressibility, respiratory phasicity and response to augmentation. Popliteal Vein: No evidence of thrombus. Normal compressibility, respiratory phasicity and response to augmentation. Calf Veins: No thrombus of the posterior tibial vein. Peroneal vein not visualized completely. Superficial Great Saphenous Vein: No evidence of thrombus. Normal compressibility and flow on color Doppler imaging. Other Findings:  Edema of the lower extremity. LEFT LOWER EXTREMITY Common Femoral Vein: No evidence of thrombus. Normal compressibility, respiratory phasicity and response to augmentation. Saphenofemoral Junction: No evidence of thrombus. Normal compressibility and flow on color Doppler imaging. Profunda Femoral Vein: No evidence of thrombus. Normal compressibility and flow on color Doppler imaging. Femoral Vein: No evidence of thrombus. Normal compressibility, respiratory phasicity and response to augmentation. Popliteal Vein: No evidence of thrombus. Normal compressibility, respiratory phasicity and response to augmentation. Calf Veins: No evidence of thrombus. Normal compressibility and flow on color Doppler imaging. Superficial Great Saphenous Vein: No evidence of thrombus. Normal compressibility and flow on color Doppler imaging. Other Findings:  Edema of lower extremity. IMPRESSION: Sonographic survey bilateral lower extremities negative for DVT. Bilateral lower extremity edema.  Signed, Yvone Neu. Loreta Ave, DO [facility] Radiology Electronically Signed   By: Gilmer Mor D.O.   On: 03/13/2016 12:55      Lab Results  Component Value Date   HGBA1C 6.3* 03/13/2016   Lab Results  Component Value Date   CREATININE 0.73 03/14/2016       Scheduled Meds: . antiseptic oral rinse  7 mL Mouth Rinse BID  . aspirin EC  81 mg Oral Daily  . atorvastatin  80 mg Oral Daily  . insulin aspart  0-9 Units Subcutaneous TID WC  . ipratropium-albuterol  3 mL Nebulization TID  . levothyroxine  50 mcg Oral QAC breakfast  . methylPREDNISolone (SOLU-MEDROL) injection  60 mg Intravenous Q6H  . metoprolol succinate  25 mg Oral Daily  . pantoprazole  40 mg Oral Daily  . sacubitril-valsartan  1 tablet Oral BID  . sodium chloride flush  3 mL Intravenous Q12H  . triamterene-hydrochlorothiazide  1 tablet Oral Daily   Continuous Infusions: . sodium chloride 10 mL/hr at 03/13/16 0347  . heparin 1,800 Units/hr (03/14/16 0757)     LOS: 1 day    Time spent: >30 MINS    [facility],Bruce Barrera  [facility] Pager [phone]. If 7PM-7AM, please contact night-coverage at [email or web address], password [email or web address] 03/14/2016, 8:25 AM  LOS: 1 day

## 2016-03-14 NOTE — Progress Notes (Signed)
Received pt from [facility] on IV Heparin drip at 18cc. Pt voices no complaints. All MDs made aware

## 2016-03-14 NOTE — Progress Notes (Signed)
Patient ID: Bruce Barrera, male   DOB: 1951-08-26, 65 y.o.   MRN: [id number]    Subjective:  Denies SSCP, palpitations or Dyspnea COPD much better  Objective:  Filed Vitals:   03/13/16 2150 03/14/16 0542 03/14/16 0723 03/14/16 1154  BP: 120/66 115/74  108/67  Pulse: 94 86  79  Temp: 98.1 F (36.7 C) 97.8 F (36.6 C)  97.8 F (36.6 C)  TempSrc: Oral Oral  Oral  Resp: 20 20  20   Height:    5' 11.25" (1.81 m)  Weight:    251 lb 5.2 oz (114 kg)  SpO2: 97% 95% 93% 96%    Intake/Output from previous day:  Intake/Output Summary (Last 24 hours) at 03/14/16 1307 Last data filed at 03/14/16 0543  Gross per 24 hour  Intake    480 ml  Output   2300 ml  Net  -1820 ml    Physical Exam: Affect appropriate Obese white male  HEENT: normal Neck supple with no adenopathy JVP normal no bruits no thyromegaly Lungs mild end exp  wheezing and good diaphragmatic motion Heart:  S1/S2 no murmur, no rub, gallop or click PMI normal Abdomen: benighn, BS positve, no tenderness, no AAA no bruit.  No HSM or HJR Distal pulses intact with no bruits No edema Neuro non-focal Skin warm and dry No muscular weakness   Lab Results: Basic Metabolic Panel:  Recent Labs  40/98/1105/11/25 0420 03/14/16 0416  NA 136 136  K 3.7 3.4*  CL 105 102  CO2 26 25  GLUCOSE 200* 207*  BUN 12 15  CREATININE 0.62 0.73  CALCIUM 8.4* 8.9   Liver Function Tests:  Recent Labs  03/13/16 0420 03/14/16 0416  AST 21 18  ALT 20 20  ALKPHOS 100 85  BILITOT 0.7 0.4  PROT 6.7 6.8  ALBUMIN 3.6 3.4*  CBC:  Recent Labs  03/13/16 0420 03/14/16 0416  WBC 13.7* 21.6*  HGB 15.3 15.6  HCT 46.3 46.7  MCV 97.7 98.5  PLT 115* 135*   Cardiac Enzymes:  Recent Labs  03/13/16 0420 03/13/16 0909 03/13/16 1520  TROPONINI 0.03* 0.03* <0.03   BNP: Invalid input(s): POCBNP D-Dimer:  Recent Labs  03/12/16 2310  DDIMER 0.87*   Hemoglobin A1C:  Recent Labs  03/13/16 0420  HGBA1C 6.3*    Imaging: Dg  Chest 2 View  03/13/2016  CLINICAL DATA:  Chest pain for 5 hours. Shortness of breath, nausea and diaphoresis. EXAM: CHEST  2 VIEW COMPARISON:  Chest radiograph 09/30/2013 FINDINGS: Heart size and mediastinal contours are unchanged. Bronchitic markings appear chronic. No focal airspace disease, pulmonary edema, pneumothorax or pleural effusion. There is degenerative change in the lower thoracic and upper lumbar spine. IMPRESSION: Chronic bronchitic change.  No acute process. Electronically Signed   By: Rubye OaksMelanie  Ehinger M.D.   On: 03/13/2016 00:23   Ct Angio Chest Pe W/cm &/or Wo Cm  03/13/2016  CLINICAL DATA:  Chest pain and shortness of breath since last night. Elevated D-dimer. EXAM: CT ANGIOGRAPHY CHEST WITH CONTRAST TECHNIQUE: Multidetector CT imaging of the chest was performed using the standard protocol during bolus administration of intravenous contrast. Multiplanar CT image reconstructions and MIPs were obtained to evaluate the vascular anatomy. CONTRAST:  100 cc Isovue 370 IV COMPARISON:  Radiographs 2 hours prior. Radiograph 09/30/2013 also reviewed. FINDINGS: Cardiovascular: There are no filling defects within the pulmonary arteries to suggest pulmonary embolus. Minimal atherosclerosis of the aortic arch. No aortic dissection or aneurysm. Mild distal aortic tortuosity. There are coronary  artery calcifications. Mediastinum/Nodes: Prominent right upper paratracheal node measures 9 mm short axis, borderline. No additional mediastinal or hilar adenopathy. No pericardial fluid. Lungs/Pleura: Central bronchial thickening most prominent in the lower lobes. Minimal dependent atelectasis. Breathing motion artifact obscures dependent left lower lobe evaluation. No consolidation to suggest pneumonia. No pulmonary mass. No suspicious nodule allowing for breathing motion. Upper Abdomen: No acute abnormality. Musculoskeletal: Multilevel degenerative change throughout the spine. There are no acute or suspicious  osseous abnormalities. Review of the MIP images confirms the above findings. IMPRESSION: 1. No pulmonary embolus. 2. Bronchial thickening, progressed from prior radiograph. 3. Mild aortic atherosclerosis.  Coronary artery calcifications. Electronically Signed   By: Rubye OaksMelanie  Ehinger M.D.   On: 03/13/2016 01:52   [place] Venous Img Lower Bilateral  03/13/2016  CLINICAL DATA:  65 year old male with a history of lower extremity edema EXAM: BILATERAL LOWER EXTREMITY VENOUS DOPPLER ULTRASOUND TECHNIQUE: Gray-scale sonography with graded compression, as well as color Doppler and duplex ultrasound were performed to evaluate the lower extremity deep venous systems from the level of the common femoral vein and including the common femoral, femoral, profunda femoral, popliteal and calf veins including the posterior tibial, peroneal and gastrocnemius veins when visible. The superficial great saphenous vein was also interrogated. Spectral Doppler was utilized to evaluate flow at rest and with distal augmentation maneuvers in the common femoral, femoral and popliteal veins. COMPARISON:  None. FINDINGS: RIGHT LOWER EXTREMITY Common Femoral Vein: No evidence of thrombus. Normal compressibility, respiratory phasicity and response to augmentation. Saphenofemoral Junction: No evidence of thrombus. Normal compressibility and flow on color Doppler imaging. Profunda Femoral Vein: No evidence of thrombus. Normal compressibility and flow on color Doppler imaging. Femoral Vein: No evidence of thrombus. Normal compressibility, respiratory phasicity and response to augmentation. Popliteal Vein: No evidence of thrombus. Normal compressibility, respiratory phasicity and response to augmentation. Calf Veins: No thrombus of the posterior tibial vein. Peroneal vein not visualized completely. Superficial Great Saphenous Vein: No evidence of thrombus. Normal compressibility and flow on color Doppler imaging. Other Findings:  Edema of the lower  extremity. LEFT LOWER EXTREMITY Common Femoral Vein: No evidence of thrombus. Normal compressibility, respiratory phasicity and response to augmentation. Saphenofemoral Junction: No evidence of thrombus. Normal compressibility and flow on color Doppler imaging. Profunda Femoral Vein: No evidence of thrombus. Normal compressibility and flow on color Doppler imaging. Femoral Vein: No evidence of thrombus. Normal compressibility, respiratory phasicity and response to augmentation. Popliteal Vein: No evidence of thrombus. Normal compressibility, respiratory phasicity and response to augmentation. Calf Veins: No evidence of thrombus. Normal compressibility and flow on color Doppler imaging. Superficial Great Saphenous Vein: No evidence of thrombus. Normal compressibility and flow on color Doppler imaging. Other Findings:  Edema of lower extremity. IMPRESSION: Sonographic survey bilateral lower extremities negative for DVT. Bilateral lower extremity edema. Signed, Yvone NeuJaime S. Loreta AveWagner, DO [facility] Electronically Signed   By: Gilmer MorJaime  Wagner D.O.   On: 03/13/2016 12:55    Cardiac Studies:  ECG:  SR subacute anterior lateral infarct with precordial Q waves and biphasic T waves    Telemetry:  NSR no arrhythmia   Echo: EF 30-35% anterior infarct   Medications:   . antiseptic oral rinse  7 mL Mouth Rinse BID  . aspirin EC  81 mg Oral Daily  . atorvastatin  80 mg Oral Daily  . insulin aspart  0-9 Units Subcutaneous TID WC  . ipratropium-albuterol  3 mL Nebulization TID  . levothyroxine  50 mcg Oral QAC  breakfast  . methylPREDNISolone (SOLU-MEDROL) injection  40 mg Intravenous Q6H  . metoprolol succinate  25 mg Oral Daily  . pantoprazole  40 mg Oral Daily  . sacubitril-valsartan  1 tablet Oral BID  . sodium chloride flush  3 mL Intravenous Q12H     . sodium chloride 10 mL/hr at 03/13/16 0347  . heparin 1,800 Units/hr (03/14/16 0757)     Assessment/Plan:  Chest Pain:  Minimal elevation in troponin ? Current pain from COPD or post MI pericardial pain ECG suggests subacute MI.  On valsartan and metoprolol continue heparin and aspirin Discussed right and left cath with patient willing to proceed Placed on board with Dr Eldridge Dace Orders written    COPD:  Improved would cut back on iv steroids. Elevated WBC from this. Continue nebulizer  Thyroid:  On replacement  Will order TSH for am   Chol:  On statin high dose   Charlton Haws 03/14/2016, 1:07 PM

## 2016-03-14 NOTE — Progress Notes (Signed)
[facility] Pt left with [facility] with all belongings.

## 2016-03-14 NOTE — Progress Notes (Signed)
ANTICOAGULATION CONSULT NOTE -  Pharmacy Consult for HEPARIN Indication: chest pain/ACS  No Known Allergies  Patient Measurements: Height: 5\' 11"  (180.3 cm) Weight: 260 lb 2.3 oz (118 kg) IBW/kg (Calculated) : 75.3 HEPARIN DW (KG): 101.3  Vital Signs: Temp: 97.8 F (36.6 C) (07/04 0542) Temp Source: Oral (07/04 0542) BP: 115/74 mmHg (07/04 0542) Pulse Rate: 86 (07/04 0542)  Labs:  Recent Labs  03/12/16 2310 03/13/16 0420 03/13/16 0909 03/13/16 1520 03/13/16 1940 03/14/16 0416  HGB 17.0 15.3  --   --   --  15.6  HCT 49.4 46.3  --   --   --  46.7  PLT 124* 115*  --   --   --  135*  HEPARINUNFRC  --   --   --   --  0.13* 0.20*  CREATININE 0.87 0.62  --   --   --  0.73  TROPONINI 0.04* 0.03* 0.03* <0.03  --   --    Estimated Creatinine Clearance: 121.9 mL/min (by C-G formula based on Cr of 0.73).  Medical History: Past Medical History  Diagnosis Date  . Coronary artery disease     Details unclear  . Essential hypertension, benign   . GERD (gastroesophageal reflux disease)   . Arthritis     Hips and knees  . MI (myocardial infarction) (HCC)     States "heart attack" 2[place]95 [place]  . Peptic ulcer     GIB 1994  . Obesity   . Stroke [facility](HCC)     States "stroke" with left sided weakness 5/95 in prison in [place]  . Hemorrhoids   . Back pain   . Hyperlipidemia   . Thyroid disease    Medications:  Facility-administered medications prior to admission  Medication Dose Route Frequency Provider Last Rate Last Dose  . 0.9 %  sodium chloride infusion  500 mL Intravenous Continuous Iva Booparl E Gessner, MD       Prescriptions prior to admission  Medication Sig Dispense Refill Last Dose  . acetaminophen (TYLENOL) 650 MG CR tablet Take one by mouth twice daily    unknown  . aspirin 81 MG tablet Take 81 mg by mouth daily.     Past Week at Unknown time  . atorvastatin (LIPITOR) 40 MG tablet Take 40 mg by mouth daily.     Past Week at Unknown time  . Cyanocobalamin (VITAMIN  B-12) 2500 MCG SUBL Take one by mouth twice daily     Past Week at Unknown time  . levothyroxine (SYNTHROID) 100 MCG tablet Take 100 mcg by mouth daily before breakfast.    Past Week at Unknown time  . metoprolol succinate (TOPROL-XL) 25 MG 24 hr tablet Take 25 mg by mouth daily.   Past Week at Unknown time  . Omega-3 Fatty Acids (FISH OIL) 1200 MG CAPS Take one by mouth twice daily     Past Week at Unknown time  . omeprazole (PRILOSEC) 20 MG capsule Take 20 mg by mouth daily.     Past Week at Unknown time  . pravastatin (PRAVACHOL) 40 MG tablet Take 40 mg by mouth daily.   Past Week at Unknown time  . triamterene-hydrochlorothiazide (MAXZIDE-25) 37.5-25 MG tablet Take 1 tablet by mouth daily.    Past Week at Unknown time  . vardenafil (LEVITRA) 20 MG tablet Take 20 mg by mouth daily as needed.     unknown   Assessment: 65YO OBESE MALE with self reported h/o MI in May 2005.  Pt admitted with chest pain. Asked to initiate Heparin for ACS.  Baseline labs reviewed, Platelets on low end.  Pt not on blood thinners PTA (none listed on PTA med list).  Heparin level remains below goal.  Goal of Therapy:  Heparin level 0.3-0.7 units/ml Monitor platelets by anticoagulation protocol: Yes   Plan:  Heparin 2000 units IV bolus now x 1 Increase Heparin infusion to 1800 units/hr Check heparin level in 6-8 hrs then daily CBC daily while on Heparin  Margo AyeHall, Britanie Harshman A 03/14/2016,7:38 AM

## 2016-03-15 ENCOUNTER — Encounter (HOSPITAL_COMMUNITY): Payer: Self-pay | Admitting: Cardiovascular Disease

## 2016-03-15 ENCOUNTER — Encounter (HOSPITAL_COMMUNITY)
Admission: EM | Disposition: A | Discharge status: Home Health | Payer: Self-pay | Source: Home / Self Care | Attending: Internal Medicine

## 2016-03-15 DIAGNOSIS — I2109 ST elevation (STEMI) myocardial infarction involving other coronary artery of anterior wall: Principal | ICD-10-CM

## 2016-03-15 DIAGNOSIS — IMO0001 Reserved for inherently not codable concepts without codable children: Secondary | ICD-10-CM | POA: Diagnosis present

## 2016-03-15 DIAGNOSIS — I25118 Atherosclerotic heart disease of native coronary artery with other forms of angina pectoris: Secondary | ICD-10-CM

## 2016-03-15 DIAGNOSIS — R079 Chest pain, unspecified: Secondary | ICD-10-CM | POA: Insufficient documentation

## 2016-03-15 HISTORY — PX: CARDIAC CATHETERIZATION: SHX172

## 2016-03-15 LAB — TSH: TSH: 0.695 u[IU]/mL (ref 0.350–4.500)

## 2016-03-15 LAB — POCT I-STAT 3, VENOUS BLOOD GAS (G3P V)
Acid-Base Excess: 3 mmol/L — ABNORMAL HIGH (ref 0.0–2.0)
Bicarbonate: 30.4 mEq/L — ABNORMAL HIGH (ref 20.0–24.0)
O2 Saturation: 71 %
TCO2: 32 mmol/L (ref 0–100)
pCO2, Ven: 54.6 mmHg — ABNORMAL HIGH (ref 45.0–50.0)
pH, Ven: 7.354 — ABNORMAL HIGH (ref 7.250–7.300)
pO2, Ven: 40 mmHg (ref 31.0–45.0)

## 2016-03-15 LAB — HEPARIN LEVEL (UNFRACTIONATED): Heparin Unfractionated: 0.54 IU/mL (ref 0.30–0.70)

## 2016-03-15 LAB — PROTIME-INR
INR: 1.07 (ref 0.00–1.49)
Prothrombin Time: 14.1 seconds (ref 11.6–15.2)

## 2016-03-15 LAB — CBC
HCT: 48.1 % (ref 39.0–52.0)
Hemoglobin: 16.1 g/dL (ref 13.0–17.0)
MCH: 32.7 pg (ref 26.0–34.0)
MCHC: 33.5 g/dL (ref 30.0–36.0)
MCV: 97.8 fL (ref 78.0–100.0)
Platelets: 145 10*3/uL — ABNORMAL LOW (ref 150–400)
RBC: 4.92 MIL/uL (ref 4.22–5.81)
RDW: 14.9 % (ref 11.5–15.5)
WBC: 20.9 10*3/uL — ABNORMAL HIGH (ref 4.0–10.5)

## 2016-03-15 LAB — GLUCOSE, CAPILLARY
Glucose-Capillary: 149 mg/dL — ABNORMAL HIGH (ref 65–99)
Glucose-Capillary: 299 mg/dL — ABNORMAL HIGH (ref 65–99)
Glucose-Capillary: 197 mg/dL — ABNORMAL HIGH (ref 65–99)

## 2016-03-15 LAB — BASIC METABOLIC PANEL
Anion gap: 7 (ref 5–15)
BUN: 13 mg/dL (ref 6–20)
CO2: 28 mmol/L (ref 22–32)
Calcium: 9 mg/dL (ref 8.9–10.3)
Chloride: 104 mmol/L (ref 101–111)
Creatinine, Ser: 0.69 mg/dL (ref 0.61–1.24)
GFR calc Af Amer: >60 mL/min (ref >60)
GFR calc non Af Amer: >60 mL/min (ref >60)
Glucose, Bld: 164 mg/dL — ABNORMAL HIGH (ref 65–99)
Potassium: 3.7 mmol/L (ref 3.5–5.1)
Sodium: 139 mmol/L (ref 135–145)

## 2016-03-15 LAB — POCT I-STAT 3, ART BLOOD GAS (G3+)
Acid-Base Excess: 1 mmol/L (ref 0.0–2.0)
Bicarbonate: 28 mEq/L — ABNORMAL HIGH (ref 20.0–24.0)
O2 Saturation: 94 %
TCO2: 29 mmol/L (ref 0–100)
pCO2 arterial: 50.4 mmHg — ABNORMAL HIGH (ref 35.0–45.0)
pH, Arterial: 7.353 (ref 7.350–7.450)
pO2, Arterial: 76 mmHg — ABNORMAL LOW (ref 80.0–100.0)

## 2016-03-15 SURGERY — RIGHT/LEFT HEART CATH AND CORONARY ANGIOGRAPHY
Anesthesia: LOCAL

## 2016-03-15 MED ORDER — HEPARIN (PORCINE) IN NACL 2-0.9 UNIT/ML-% IJ SOLN
INTRAMUSCULAR | Status: AC
  Filled 2016-03-15: qty 1500

## 2016-03-15 MED ORDER — MIDAZOLAM HCL 2 MG/2ML IJ SOLN
INTRAMUSCULAR | Status: DC | PRN
  Administered 2016-03-15: 1 mg via INTRAVENOUS

## 2016-03-15 MED ORDER — FENTANYL CITRATE (PF) 100 MCG/2ML IJ SOLN
INTRAMUSCULAR | Status: AC
  Filled 2016-03-15: qty 2

## 2016-03-15 MED ORDER — SODIUM CHLORIDE 0.9 % IV SOLN: 250.0000 mL | INTRAVENOUS | Status: DC | PRN

## 2016-03-15 MED ORDER — NITROGLYCERIN 1 MG/10 ML FOR IR/CATH LAB
INTRA_ARTERIAL | Status: DC | PRN
  Administered 2016-03-15: 200 ug via INTRACORONARY

## 2016-03-15 MED ORDER — SODIUM CHLORIDE 0.9% FLUSH
3.0000 mL | Freq: Two times a day (BID) | INTRAVENOUS | Status: DC
  Administered 2016-03-15 – 2016-03-16 (×2): 3 mL via INTRAVENOUS

## 2016-03-15 MED ORDER — CARVEDILOL 12.5 MG PO TABS
12.5000 mg | ORAL_TABLET | Freq: Two times a day (BID) | ORAL | Status: DC
  Administered 2016-03-15 – 2016-03-16 (×2): 12.5 mg via ORAL
  Filled 2016-03-15 (×2): qty 1

## 2016-03-15 MED ORDER — IOPAMIDOL (ISOVUE-370) INJECTION 76%
INTRAVENOUS | Status: AC
  Filled 2016-03-15: qty 100

## 2016-03-15 MED ORDER — ENOXAPARIN SODIUM 40 MG/0.4ML ~~LOC~~ SOLN
40.0000 mg | SUBCUTANEOUS | Status: DC
  Administered 2016-03-16: 40 mg via SUBCUTANEOUS
  Filled 2016-03-15: qty 0.4

## 2016-03-15 MED ORDER — SODIUM CHLORIDE 0.9 % IV SOLN
INTRAVENOUS | Status: AC
  Administered 2016-03-15: 13:00:00 via INTRAVENOUS

## 2016-03-15 MED ORDER — LIDOCAINE HCL (PF) 1 % IJ SOLN
INTRAMUSCULAR | Status: DC | PRN
  Administered 2016-03-15: 4 mL via SUBCUTANEOUS

## 2016-03-15 MED ORDER — HEPARIN (PORCINE) IN NACL 2-0.9 UNIT/ML-% IJ SOLN
INTRAMUSCULAR | Status: DC | PRN
  Administered 2016-03-15: 12:00:00 via INTRA_ARTERIAL

## 2016-03-15 MED ORDER — LIDOCAINE HCL (PF) 1 % IJ SOLN
INTRAMUSCULAR | Status: AC
  Filled 2016-03-15: qty 30

## 2016-03-15 MED ORDER — MIDAZOLAM HCL 2 MG/2ML IJ SOLN
INTRAMUSCULAR | Status: AC
  Filled 2016-03-15: qty 2

## 2016-03-15 MED ORDER — SODIUM CHLORIDE 0.9% FLUSH: 3.0000 mL | INTRAVENOUS | Status: DC | PRN

## 2016-03-15 MED ORDER — PREDNISONE 50 MG PO TABS
60.0000 mg | ORAL_TABLET | Freq: Every day | ORAL | Status: DC
  Administered 2016-03-16: 60 mg via ORAL
  Filled 2016-03-15: qty 1

## 2016-03-15 MED ORDER — HEPARIN SODIUM (PORCINE) 1000 UNIT/ML IJ SOLN
INTRAMUSCULAR | Status: AC
  Filled 2016-03-15: qty 1

## 2016-03-15 MED ORDER — HEPARIN SODIUM (PORCINE) 1000 UNIT/ML IJ SOLN
INTRAMUSCULAR | Status: DC | PRN
Start: 2016-03-15 — End: 2016-03-15
  Administered 2016-03-15: 5000 [IU] via INTRAVENOUS

## 2016-03-15 MED ORDER — NITROGLYCERIN 1 MG/10 ML FOR IR/CATH LAB
INTRA_ARTERIAL | Status: AC
  Filled 2016-03-15: qty 10

## 2016-03-15 MED ORDER — FENTANYL CITRATE (PF) 100 MCG/2ML IJ SOLN
INTRAMUSCULAR | Status: DC | PRN
  Administered 2016-03-15: 25 ug via INTRAVENOUS

## 2016-03-15 MED ORDER — HEPARIN (PORCINE) IN NACL 2-0.9 UNIT/ML-% IJ SOLN
INTRAMUSCULAR | Status: DC | PRN
  Administered 2016-03-15: 1500 mL via INTRA_ARTERIAL

## 2016-03-15 MED ORDER — IOPAMIDOL (ISOVUE-370) INJECTION 76%
INTRAVENOUS | Status: DC | PRN
  Administered 2016-03-15: 80 mL via INTRAVENOUS

## 2016-03-15 SURGICAL SUPPLY — 11 items
CATH BALLN WEDGE 5F 110CM (CATHETERS) ×2 IMPLANT
CATH INFINITI 5FR ANG PIGTAIL (CATHETERS) ×2 IMPLANT
CATH OPTITORQUE TIG 4.0 5F (CATHETERS) ×2 IMPLANT
DEVICE RAD COMP TR BAND LRG (VASCULAR PRODUCTS) ×2 IMPLANT
GLIDESHEATH SLEND SS 6F .021 (SHEATH) ×4 IMPLANT
KIT HEART LEFT (KITS) ×2 IMPLANT
PACK CARDIAC CATHETERIZATION (CUSTOM PROCEDURE TRAY) ×2 IMPLANT
SYR MEDRAD MARK V 150ML (SYRINGE) ×2 IMPLANT
TRANSDUCER W/STOPCOCK (MISCELLANEOUS) ×2 IMPLANT
TUBING CIL FLEX 10 FLL-RA (TUBING) ×2 IMPLANT
WIRE SAFE-T 1.5MM-J .035X260CM (WIRE) ×2 IMPLANT

## 2016-03-15 NOTE — Progress Notes (Signed)
TR band removed at 1530. A 2x2 guaze with tegaderm was applied. Site remains level 0. R pulses remain 2+, extremity is warm to touch, normal color for ethnicity. Vital signs stable. Pt instructed to not put any pressure or use arm. Pt instructed to call if arm begins to bleed. Pt verbalized understanding. Will continue to monitor pt.  Jilda PandaBethany Danilyn Cocke RN

## 2016-03-15 NOTE — Progress Notes (Signed)
65yo WM with history of tobacco abuse, COPD, HTN, HL transferred [facility] with subacute anterior MI. Patient reports Friday developed acute diaphoresis, chest pain, and marked weakness. Did not seek medical attention. On Sunday developed more severe chest pain and throat tightness that lasted several hours.   TELEMETRY: Reviewed telemetry pt in NSR with rare PVC, triplet: Filed Vitals:   03/14/16 1154 03/14/16 2034 03/15/16 0030 03/15/16 0536  BP: 108/67 112/75 113/84 136/55  Pulse: 79 92 81 80  Temp: 97.8 F (36.6 C) 98.2 F (36.8 C) 98 F (36.7 C) 97.6 F (36.4 C)  TempSrc: Oral Oral Oral Oral  Resp: 20 20 20 20   Height: 5' 11.25" (1.81 m)     Weight: 251 lb 5.2 oz (114 kg)   244 lb 12.8 oz (111.041 kg)  SpO2: 96% 94% 98% 95%    Intake/Output Summary (Last 24 hours) at 03/15/16 0734 Last data filed at 03/15/16 0537  Gross per 24 hour  Intake    960 ml  Output   3250 ml  Net  -2290 ml   Filed Weights   03/13/16 0332 03/14/16 1154 03/15/16 0536  Weight: 260 lb 2.3 oz (118 kg) 251 lb 5.2 oz (114 kg) 244 lb 12.8 oz (111.041 kg)    Subjective Feels much better today. No chest pain or dyspnea.  Marland Kitchen. antiseptic oral rinse  7 mL Mouth Rinse BID  . aspirin EC  81 mg Oral Daily  . atorvastatin  80 mg Oral Daily  . insulin aspart  0-9 Units Subcutaneous TID WC  . levothyroxine  50 mcg Oral QAC breakfast  . methylPREDNISolone (SOLU-MEDROL) injection  40 mg Intravenous Q6H  . metoprolol succinate  25 mg Oral Daily  . pantoprazole  40 mg Oral Daily  . sacubitril-valsartan  1 tablet Oral BID  . sodium chloride flush  3 mL Intravenous Q12H  . sodium chloride flush  3 mL Intravenous Q12H   . sodium chloride 10 mL/hr at 03/13/16 0347  . sodium chloride    . heparin 1,800 Units/hr (03/14/16 0757)    LABS: Basic Metabolic Panel:  Recent Labs  16/06/9606/04/17 0416 03/15/16 0247  NA 136 139  K 3.4* 3.7  CL 102 104  CO2 25 28  GLUCOSE 207* 164*  BUN 15 13    CREATININE 0.73 0.69  CALCIUM 8.9 9.0   Liver Function Tests:  Recent Labs  03/13/16 0420 03/14/16 0416  AST 21 18  ALT 20 20  ALKPHOS 100 85  BILITOT 0.7 0.4  PROT 6.7 6.8  ALBUMIN 3.6 3.4*   No results for input(s): LIPASE, AMYLASE in the last 72 hours. CBC:  Recent Labs  03/14/16 0416 03/15/16 0247  WBC 21.6* 20.9*  HGB 15.6 16.1  HCT 46.7 48.1  MCV 98.5 97.8  PLT 135* 145*   Cardiac Enzymes:  Recent Labs  03/13/16 0420 03/13/16 0909 03/13/16 1520  TROPONINI 0.03* 0.03* <0.03   BNP: No results for input(s): PROBNP in the last 72 hours. D-Dimer:  Recent Labs  03/12/16 2310  DDIMER 0.87*   Hemoglobin A1C:  Recent Labs  03/13/16 0420  HGBA1C 6.3*   Fasting Lipid Panel: No results for input(s): CHOL, HDL, LDLCALC, TRIG, CHOLHDL, LDLDIRECT in the last 72 hours. Thyroid Function Tests:  Recent Labs  03/15/16 0247  TSH 0.695     Radiology/Studies:  [place] Venous Img Lower Bilateral  03/13/2016  CLINICAL DATA:  65 year old male with a history of lower extremity edema EXAM: BILATERAL  LOWER EXTREMITY VENOUS DOPPLER ULTRASOUND TECHNIQUE: Gray-scale sonography with graded compression, as well as color Doppler and duplex ultrasound were performed to evaluate the lower extremity deep venous systems from the level of the common femoral vein and including the common femoral, femoral, profunda femoral, popliteal and calf veins including the posterior tibial, peroneal and gastrocnemius veins when visible. The superficial great saphenous vein was also interrogated. Spectral Doppler was utilized to evaluate flow at rest and with distal augmentation maneuvers in the common femoral, femoral and popliteal veins. COMPARISON:  None. FINDINGS: RIGHT LOWER EXTREMITY Common Femoral Vein: No evidence of thrombus. Normal compressibility, respiratory phasicity and response to augmentation. Saphenofemoral Junction: No evidence of thrombus. Normal compressibility and flow on color  Doppler imaging. Profunda Femoral Vein: No evidence of thrombus. Normal compressibility and flow on color Doppler imaging. Femoral Vein: No evidence of thrombus. Normal compressibility, respiratory phasicity and response to augmentation. Popliteal Vein: No evidence of thrombus. Normal compressibility, respiratory phasicity and response to augmentation. Calf Veins: No thrombus of the posterior tibial vein. Peroneal vein not visualized completely. Superficial Great Saphenous Vein: No evidence of thrombus. Normal compressibility and flow on color Doppler imaging. Other Findings:  Edema of the lower extremity. LEFT LOWER EXTREMITY Common Femoral Vein: No evidence of thrombus. Normal compressibility, respiratory phasicity and response to augmentation. Saphenofemoral Junction: No evidence of thrombus. Normal compressibility and flow on color Doppler imaging. Profunda Femoral Vein: No evidence of thrombus. Normal compressibility and flow on color Doppler imaging. Femoral Vein: No evidence of thrombus. Normal compressibility, respiratory phasicity and response to augmentation. Popliteal Vein: No evidence of thrombus. Normal compressibility, respiratory phasicity and response to augmentation. Calf Veins: No evidence of thrombus. Normal compressibility and flow on color Doppler imaging. Superficial Great Saphenous Vein: No evidence of thrombus. Normal compressibility and flow on color Doppler imaging. Other Findings:  Edema of lower extremity. IMPRESSION: Sonographic survey bilateral lower extremities negative for DVT. Bilateral lower extremity edema. Signed, Yvone NeuJaime S. Loreta AveWagner, DO [facility] Electronically Signed   By: Gilmer MorJaime  Wagner D.O.   On: 03/13/2016 12:55   Ecg shows NSR with anterior infarct- new since 2012.  Echo: Study Conclusions  - Left ventricle: The cavity size was normal. Wall thickness was  increased increased in a pattern of mild to moderate  LVH.  Systolic function was moderately to severely reduced. The  estimated ejection fraction was in the range of 30% to 35%.  Doppler parameters are consistent with abnormal left ventricular  relaxation (grade 1 diastolic dysfunction). - Regional wall motion abnormality: Akinesis of the apical  anterior, mid anteroseptal, apical inferior, apical lateral, and  apical myocardium; hypokinesis of the mid anterior and basal  anteroseptal myocardium. - Aortic valve: Mildly calcified annulus. Trileaflet; mildly  thickened leaflets. Valve area (VTI): 2.58 cm^2. Valve area  (Vmax): 2.86 cm^2. - Left atrium: The atrium was mildly dilated. - Atrial septum: No defect or patent foramen ovale was identified. - Technically difficult study. Echocontrast was used to enhance  visualization.  PHYSICAL EXAM General: Well developed, well nourished, in no acute distress. Head: Normocephalic, atraumatic, sclera non-icteric, oropharynx is clear Neck: Negative for carotid bruits. JVD not elevated. No adenopathy Lungs: Clear bilaterally to auscultation without wheezes, rales, or rhonchi. Breathing is unlabored. Heart: RRR S1 S2 without murmurs, rubs, or gallops.  Abdomen: Soft, non-tender, non-distended with normoactive bowel sounds. No hepatomegaly. No rebound/guarding. No obvious abdominal masses. Msk:  Strength and tone appears normal for age. Extremities: No clubbing, cyanosis or edema.  Distal pedal pulses are 2+ and equal bilaterally. Neuro: Alert and oriented X 3. Moves all extremities spontaneously. Psych:  Responds to questions appropriately with a normal affect.  ASSESSMENT AND PLAN: 1. Subacute anterior MI. I suspect this started on Friday. At time of presentation Troponin minimally elevated. Ecg with new changes.  Echo shows regional wall motion abnormalities. CT chest shows heavy calcification in LAD territory. Now on IV heparin and pain free. Also on beta blocker, ASA, and statin. Plan  [facility] today with possible intervention. The procedure and risks were reviewed including but not limited to death, myocardial infarction, stroke, arrythmias, bleeding, transfusion, emergency surgery, dye allergy, or renal dysfunction. The patient voices understanding and is agreeable to proceed..  2. Acute systolic CHF with ischemic cardiomyopathy. EF 30-35%.Appears euvolemic now. On metoprolol and Entresto. Will switch Toprol to Coreg. Assess right heart and LV filling pressures by cath today.  3. Tobacco abuse. Patient states he plans on quitting now.   4. HTN controlled.   5. Hypercholesterolemia. On high dose statin.  6. Hyperglycemia. A1c 6.3%. Suspect newly diagnosed DM. Per primary team.   7. Elevated WBC. No clinical evidence of infection. I suspect related to steroids.  8. COPD. On Nebs and steroids per primary team.   Present on Admission:  . Chest pain . COPD exacerbation (HCC) . Essential hypertension, benign . Coronary atherosclerosis of native coronary artery  Signed, Jamair Cato [place], MDFACC 03/15/2016 7:34 AM

## 2016-03-15 NOTE — Interval H&P Note (Signed)
Cath Lab Visit (complete for each Cath Lab visit)  Clinical Evaluation Leading to the Procedure:   ACS: Yes.    Non-ACS:    Anginal Classification: CCS III  Anti-ischemic medical therapy: Minimal Therapy (1 class of medications)  Non-Invasive Test Results: No non-invasive testing performed  Prior CABG: No previous CABG      History and Physical Interval Note:  03/15/2016 11:26 AM  Bruce Barrera  has presented today for surgery, with the diagnosis of unstable angina  The various methods of treatment have been discussed with the patient and family. After consideration of risks, benefits and other options for treatment, the patient has consented to  Procedure(s): Right/Left Heart Cath and Coronary Angiography (N/A) as a surgical intervention .  The patient's history has been reviewed, patient examined, no change in status, stable for surgery.  I have reviewed the patient's chart and labs.  Questions were answered to the patient's satisfaction.     Bruce Barrera

## 2016-03-15 NOTE — Progress Notes (Signed)
[facility] PROGRESS NOTE  Guadelupe SabinJohn Raysor ZOX:[id number] DOB: 1951/03/17 DOA: 03/12/2016   PCP: Josue HectorNYLAND,LEONARD ROBERT, MD     Assessment/Plan: Principal Problem:   Non-acute transmural anterior myocardial infarction within last eight weeks Active Problems:   Coronary atherosclerosis of native coronary artery   Essential hypertension, benign   Chest pain   COPD exacerbation (HCC)    Guadelupe SabinJohn Barrera is a 65 y.o. male, With history of hypertension, hyperlipidemia, CAD, COPD on home O2 at bedtime can to the hospital with chest tightness which started around 6 PM tonight. Patient says that he was able to move if and he felt his throat was closing. He denies chest pain. In the ED patient underwent CT angiogram which was negative for pulmonary embolism. Also had mild elevation of troponin 0.07, with EKG showing T-wave inversions in anterior chest leads  Assessment and plan 1. COPD exacerbation- discontinue Solu-Medrol , change to   Prednisone PO, DuoNeb every 6 hours, no pneumonia on CT, no PE 2. Chest pain/Subacute anterior MI. has mild elevation of troponin 0.04, EKG showing T-wave inversions in the anterior chest leads V 2-6, follow serial troponin and obtain .   They have concern for possible cardiac ischemia.CT chest shows heavy calcification in LAD territory. Now on IV heparin and pain free. Also on beta blocker, ASA, and statin. [facility]. . EF 30-35%. Patient is an active smoker, multiple cardiovascular risk factors,  keep   NPO  possible cardiac cath. Patient started on heparin drip by cardiology. Continue aspirin and statin.Cath shows Occluded proximal LAD of more than 72 hours of duration. With the lack of current anginal symptoms, there is no indication for revascularization. Crds  recommends continuing medical therapy for coronary artery disease and ischemic cardiomyopathy.LAD PCI can be considered in the future for refractory anginal symptoms  if there is anterior viability 3.  Hypertension- continue metoprolol, hold  Maxzide 4. Hypothyroidism- continue Synthroid 5. Hyperglycemia-hemoglobin A1c 6.3, continue sliding scale insulin 6. Abnormal d-dimer-venous Doppler negative for DVT    DVT prophylaxsis  heparin drip   Code Status:  DO NOT RESUSCITATE      Family Communication: Discussed in detail with the patient, all imaging results, lab results explained to the patient   Disposition Plan:  Anticipate dc in am       Consultants: Cardiology   Procedures-none        Antibiotics: Anti-infectives    None         HPI/Subjective: Telemetry shows PVCs, normal sinus rhythm, patient currently chest pain-free, not wheezing  Objective: Filed Vitals:   03/14/16 1154 03/14/16 2034 03/15/16 0030 03/15/16 0536  BP: 108/67 112/75 113/84 136/55  Pulse: 79 92 81 80  Temp: 97.8 F (36.6 C) 98.2 F (36.8 C) 98 F (36.7 C) 97.6 F (36.4 C)  TempSrc: Oral Oral Oral Oral  Resp: 20 20 20 20   Height: 5' 11.25" (1.81 m)     Weight: 114 kg (251 lb 5.2 oz)   111.041 kg (244 lb 12.8 oz)  SpO2: 96% 94% 98% 95%    Intake/Output Summary (Last 24 hours) at 03/15/16 0835 Last data filed at 03/15/16 0736  Gross per 24 hour  Intake    970 ml  Output   3650 ml  Net  -2680 ml    Exam:  Examination:  General exam: Appears calm and comfortable  Respiratory system: Clear to auscultation. Respiratory effort normal. Cardiovascular system: S1 & S2 heard, RRR.  No JVD, murmurs, rubs, gallops or clicks. No pedal edema. Gastrointestinal system: Abdomen is nondistended, soft and nontender. No organomegaly or masses felt. Normal bowel sounds heard. Central nervous system: Alert and oriented. No focal neurological deficits. Extremities: Symmetric 5 x 5 power. Skin: No rashes, lesions or ulcers Psychiatry: Judgement and insight appear normal. Mood & affect appropriate.     Data Reviewed: I have personally reviewed following labs  and imaging studies  Micro Results No results found for this or any previous visit (from the past 240 hour(s)).  Radiology Reports Dg Chest 2 View  03/13/2016  CLINICAL DATA:  Chest pain for 5 hours. Shortness of breath, nausea and diaphoresis. EXAM: CHEST  2 VIEW COMPARISON:  Chest radiograph 09/30/2013 FINDINGS: Heart size and mediastinal contours are unchanged. Bronchitic markings appear chronic. No focal airspace disease, pulmonary edema, pneumothorax or pleural effusion. There is degenerative change in the lower thoracic and upper lumbar spine. IMPRESSION: Chronic bronchitic change.  No acute process. Electronically Signed   By: Rubye Oaks M.D.   On: 03/13/2016 00:23   Ct Angio Chest Pe W/cm &/or Wo Cm  03/13/2016  CLINICAL DATA:  Chest pain and shortness of breath since last night. Elevated D-dimer. EXAM: CT ANGIOGRAPHY CHEST WITH CONTRAST TECHNIQUE: Multidetector CT imaging of the chest was performed using the standard protocol during bolus administration of intravenous contrast. Multiplanar CT image reconstructions and MIPs were obtained to evaluate the vascular anatomy. CONTRAST:  100 cc Isovue 370 IV COMPARISON:  Radiographs 2 hours prior. Radiograph 09/30/2013 also reviewed. FINDINGS: Cardiovascular: There are no filling defects within the pulmonary arteries to suggest pulmonary embolus. Minimal atherosclerosis of the aortic arch. No aortic dissection or aneurysm. Mild distal aortic tortuosity. There are coronary artery calcifications. Mediastinum/Nodes: Prominent right upper paratracheal node measures 9 mm short axis, borderline. No additional mediastinal or hilar adenopathy. No pericardial fluid. Lungs/Pleura: Central bronchial thickening most prominent in the lower lobes. Minimal dependent atelectasis. Breathing motion artifact obscures dependent left lower lobe evaluation. No consolidation to suggest pneumonia. No pulmonary mass. No suspicious nodule allowing for breathing motion. Upper  Abdomen: No acute abnormality. Musculoskeletal: Multilevel degenerative change throughout the spine. There are no acute or suspicious osseous abnormalities. Review of the MIP images confirms the above findings. IMPRESSION: 1. No pulmonary embolus. 2. Bronchial thickening, progressed from prior radiograph. 3. Mild aortic atherosclerosis.  Coronary artery calcifications. Electronically Signed   By: Rubye Oaks M.D.   On: 03/13/2016 01:52   US Venous Img Lower Bilateral  03/13/2016  CLINICAL DATA:  65 year old male with a history of lower extremity edema EXAM: BILATERAL LOWER EXTREMITY VENOUS DOPPLER ULTRASOUND TECHNIQUE: Gray-scale sonography with graded compression, as well as color Doppler and duplex ultrasound were performed to evaluate the lower extremity deep venous systems from the level of the common femoral vein and including the common femoral, femoral, profunda femoral, popliteal and calf veins including the posterior tibial, peroneal and gastrocnemius veins when visible. The superficial great saphenous vein was also interrogated. Spectral Doppler was utilized to evaluate flow at rest and with distal augmentation maneuvers in the common femoral, femoral and popliteal veins. COMPARISON:  None. FINDINGS: RIGHT LOWER EXTREMITY Common Femoral Vein: No evidence of thrombus. Normal compressibility, respiratory phasicity and response to augmentation. Saphenofemoral Junction: No evidence of thrombus. Normal compressibility and flow on color Doppler imaging. Profunda Femoral Vein: No evidence of thrombus. Normal compressibility and flow on color Doppler imaging. Femoral Vein: No evidence of thrombus. Normal compressibility, respiratory phasicity and response to augmentation. Popliteal  Vein: No evidence of thrombus. Normal compressibility, respiratory phasicity and response to augmentation. Calf Veins: No thrombus of the posterior tibial vein. Peroneal vein not visualized completely. Superficial Great Saphenous  Vein: No evidence of thrombus. Normal compressibility and flow on color Doppler imaging. Other Findings:  Edema of the lower extremity. LEFT LOWER EXTREMITY Common Femoral Vein: No evidence of thrombus. Normal compressibility, respiratory phasicity and response to augmentation. Saphenofemoral Junction: No evidence of thrombus. Normal compressibility and flow on color Doppler imaging. Profunda Femoral Vein: No evidence of thrombus. Normal compressibility and flow on color Doppler imaging. Femoral Vein: No evidence of thrombus. Normal compressibility, respiratory phasicity and response to augmentation. Popliteal Vein: No evidence of thrombus. Normal compressibility, respiratory phasicity and response to augmentation. Calf Veins: No evidence of thrombus. Normal compressibility and flow on color Doppler imaging. Superficial Great Saphenous Vein: No evidence of thrombus. Normal compressibility and flow on color Doppler imaging. Other Findings:  Edema of lower extremity. IMPRESSION: Sonographic survey bilateral lower extremities negative for DVT. Bilateral lower extremity edema. Signed, Yvone NeuJaime S. Loreta AveWagner, DO [facility] Electronically Signed   By: Gilmer MorJaime  Wagner D.O.   On: 03/13/2016 12:55     CBC  Recent Labs Lab 03/12/16 2310 03/13/16 0420 03/14/16 0416 03/15/16 0247  WBC 10.9* 13.7* 21.6* 20.9*  HGB 17.0 15.3 15.6 16.1  HCT 49.4 46.3 46.7 48.1  PLT 124* 115* 135* 145*  MCV 98.0 97.7 98.5 97.8  MCH 33.7 32.3 32.9 32.7  MCHC 34.4 33.0 33.4 33.5  RDW 14.5 14.5 14.7 14.9    Chemistries   Recent Labs Lab 03/12/16 2310 03/13/16 0420 03/14/16 0416 03/15/16 0247  NA 137 136 136 139  K 3.5 3.7 3.4* 3.7  CL 102 105 102 104  CO2 25 26 25 28   GLUCOSE 209* 200* 207* 164*  BUN 18 12 15 13   CREATININE 0.87 0.62 0.73 0.69  CALCIUM 8.7* 8.4* 8.9 9.0  AST  --  21 18  --   ALT  --  20 20  --   ALKPHOS  --  100 85  --   BILITOT  --  0.7 0.4  --     ------------------------------------------------------------------------------------------------------------------ estimated creatinine clearance is 118.6 mL/min (by C-G formula based on Cr of 0.69). ------------------------------------------------------------------------------------------------------------------  Recent Labs  03/13/16 0420  HGBA1C 6.3*   ------------------------------------------------------------------------------------------------------------------ No results for input(s): CHOL, HDL, LDLCALC, TRIG, CHOLHDL, LDLDIRECT in the last 72 hours. ------------------------------------------------------------------------------------------------------------------  Recent Labs  03/15/16 0247  TSH 0.695   ------------------------------------------------------------------------------------------------------------------ No results for input(s): VITAMINB12, FOLATE, FERRITIN, TIBC, IRON, RETICCTPCT in the last 72 hours.  Coagulation profile  Recent Labs Lab 03/15/16 0627  INR 1.07     Recent Labs  03/12/16 2310  DDIMER 0.87*    Cardiac Enzymes  Recent Labs Lab 03/13/16 0420 03/13/16 0909 03/13/16 1520  TROPONINI 0.03* 0.03* <0.03   ------------------------------------------------------------------------------------------------------------------ Invalid input(s): POCBNP   CBG:  Recent Labs Lab 03/13/16 2149 03/14/16 0741 03/14/16 1203 03/14/16 1646 03/15/16 0714  GLUCAP 239* 216* 210* 244* 149*       Studies: [place] Venous Img Lower Bilateral  03/13/2016  CLINICAL DATA:  65 year old male with a history of lower extremity edema EXAM: BILATERAL LOWER EXTREMITY VENOUS DOPPLER ULTRASOUND TECHNIQUE: Gray-scale sonography with graded compression, as well as color Doppler and duplex ultrasound were performed to evaluate the lower extremity deep venous systems from the level of the common femoral vein and including the common femoral, femoral, profunda  femoral, popliteal and calf veins  including the posterior tibial, peroneal and gastrocnemius veins when visible. The superficial great saphenous vein was also interrogated. Spectral Doppler was utilized to evaluate flow at rest and with distal augmentation maneuvers in the common femoral, femoral and popliteal veins. COMPARISON:  None. FINDINGS: RIGHT LOWER EXTREMITY Common Femoral Vein: No evidence of thrombus. Normal compressibility, respiratory phasicity and response to augmentation. Saphenofemoral Junction: No evidence of thrombus. Normal compressibility and flow on color Doppler imaging. Profunda Femoral Vein: No evidence of thrombus. Normal compressibility and flow on color Doppler imaging. Femoral Vein: No evidence of thrombus. Normal compressibility, respiratory phasicity and response to augmentation. Popliteal Vein: No evidence of thrombus. Normal compressibility, respiratory phasicity and response to augmentation. Calf Veins: No thrombus of the posterior tibial vein. Peroneal vein not visualized completely. Superficial Great Saphenous Vein: No evidence of thrombus. Normal compressibility and flow on color Doppler imaging. Other Findings:  Edema of the lower extremity. LEFT LOWER EXTREMITY Common Femoral Vein: No evidence of thrombus. Normal compressibility, respiratory phasicity and response to augmentation. Saphenofemoral Junction: No evidence of thrombus. Normal compressibility and flow on color Doppler imaging. Profunda Femoral Vein: No evidence of thrombus. Normal compressibility and flow on color Doppler imaging. Femoral Vein: No evidence of thrombus. Normal compressibility, respiratory phasicity and response to augmentation. Popliteal Vein: No evidence of thrombus. Normal compressibility, respiratory phasicity and response to augmentation. Calf Veins: No evidence of thrombus. Normal compressibility and flow on color Doppler imaging. Superficial Great Saphenous Vein: No evidence of thrombus. Normal  compressibility and flow on color Doppler imaging. Other Findings:  Edema of lower extremity. IMPRESSION: Sonographic survey bilateral lower extremities negative for DVT. Bilateral lower extremity edema. Signed, Yvone Neu. Loreta Ave, DO [facility] Electronically Signed   By: Gilmer Mor D.O.   On: 03/13/2016 12:55      Lab Results  Component Value Date   HGBA1C 6.3* 03/13/2016   Lab Results  Component Value Date   CREATININE 0.69 03/15/2016       Scheduled Meds: . antiseptic oral rinse  7 mL Mouth Rinse BID  . aspirin EC  81 mg Oral Daily  . atorvastatin  80 mg Oral Daily  . carvedilol  12.5 mg Oral BID WC  . insulin aspart  0-9 Units Subcutaneous TID WC  . levothyroxine  50 mcg Oral QAC breakfast  . pantoprazole  40 mg Oral Daily  . [START ON 03/16/2016] predniSONE  60 mg Oral Q breakfast  . sacubitril-valsartan  1 tablet Oral BID  . sodium chloride flush  3 mL Intravenous Q12H  . sodium chloride flush  3 mL Intravenous Q12H   Continuous Infusions: . sodium chloride 10 mL/hr at 03/13/16 0347  . sodium chloride    . heparin 1,800 Units/hr (03/14/16 0757)     LOS: 2 days    Time spent: >30 MINS    [facility]  [facility] Pager [phone]. If 7PM-7AM, please contact night-coverage at [email or web address], password [email or web address] 03/15/2016, 8:35 AM  LOS: 2 days

## 2016-03-15 NOTE — Progress Notes (Signed)
ANTICOAGULATION CONSULT NOTE - Follow Up Consult  Pharmacy Consult for heparin Indication: chest pain/ACS  No Known Allergies  Patient Measurements: Height: 5' 11.25" (181 cm) Weight: 244 lb 12.8 oz (111.041 kg) IBW/kg (Calculated) : 75.88 Heparin Dosing Weight: 100 kg  Vital Signs: Temp: 97.6 F (36.4 C) (07/05 0536) Temp Source: Oral (07/05 0536) BP: 136/55 mmHg (07/05 0536) Pulse Rate: 80 (07/05 0536)  Labs:  Recent Labs  03/13/16 0420 03/13/16 0909 03/13/16 1520  03/14/16 0416 03/14/16 1344 03/14/16 2026 03/15/16 0247 03/15/16 0627  HGB 15.3  --   --   --  15.6  --   --  16.1  --   HCT 46.3  --   --   --  46.7  --   --  48.1  --   PLT 115*  --   --   --  135*  --   --  145*  --   LABPROT  --   --   --   --   --   --   --   --  14.1  INR  --   --   --   --   --   --   --   --  1.07  HEPARINUNFRC  --   --   --   < > 0.20* 0.62 0.54 0.54  --   CREATININE 0.62  --   --   --  0.73  --   --  0.69  --   TROPONINI 0.03* 0.03* <0.03  --   --   --   --   --   --   < > = values in this interval not displayed.  Estimated Creatinine Clearance: 118.6 mL/min (by C-G formula based on Cr of 0.69).   Medications:  Facility-administered medications prior to admission  Medication Dose Route Frequency Provider Last Rate Last Dose  . 0.9 %  sodium chloride infusion  500 mL Intravenous Continuous Iva Booparl E Gessner, MD       Prescriptions prior to admission  Medication Sig Dispense Refill Last Dose  . acetaminophen (TYLENOL) 650 MG CR tablet Take one by mouth twice daily    unknown  . aspirin 81 MG tablet Take 81 mg by mouth daily.     Past Week at Unknown time  . atorvastatin (LIPITOR) 40 MG tablet Take 40 mg by mouth daily.     Past Week at Unknown time  . Cyanocobalamin (VITAMIN B-12) 2500 MCG SUBL Take one by mouth twice daily     Past Week at Unknown time  . levothyroxine (SYNTHROID) 100 MCG tablet Take 100 mcg by mouth daily before breakfast.    Past Week at Unknown time  .  metoprolol succinate (TOPROL-XL) 25 MG 24 hr tablet Take 25 mg by mouth daily.   Past Week at Unknown time  . Omega-3 Fatty Acids (FISH OIL) 1200 MG CAPS Take one by mouth twice daily     Past Week at Unknown time  . omeprazole (PRILOSEC) 20 MG capsule Take 20 mg by mouth daily.     Past Week at Unknown time  . pravastatin (PRAVACHOL) 40 MG tablet Take 40 mg by mouth daily.   Past Week at Unknown time  . triamterene-hydrochlorothiazide (MAXZIDE-25) 37.5-25 MG tablet Take 1 tablet by mouth daily.    Past Week at Unknown time  . vardenafil (LEVITRA) 20 MG tablet Take 20 mg by mouth daily as needed.     unknown  Assessment: 65 yo M admitted 03/12/2016 [facility] with chest pain.  HL this morning remains therapeutic at 0.54. No issues with infusion or sxs of bleeding.  Planning for cath.   Goal of Therapy:  Heparin level 0.3-0.7 units/ml Monitor platelets by anticoagulation protocol: Yes   Plan:  - Continue heparin 1800 units/hr - Monitor daily HL, CBC and s/sx of bleeding   Pollyann SamplesAndy Mann [place], PharmD, BCPS 03/15/2016, 7:37 AM Pager: [phone]

## 2016-03-15 NOTE — Progress Notes (Signed)
I reviewed cardiac cath results with patient. Occluded LAD with completed anterior MI. EF 30-35%.  I reviewed his desires for advanced directive. He states he is fine with being resuscitated if possible but he just doesn't want to be kept alive if it is a hopeless situation.  For this reason will rescind DNR. He is a candidate for [facility]. Will arrange. Plan repeat Echo in 6-8 weeks to see if permanent ICD indicated.  Annina Piotrowski [place] MD, [facility]

## 2016-03-15 NOTE — H&P (View-Only) (Signed)
65yo WM with history of tobacco abuse, COPD, HTN, HL transferred from [facility] with subacute anterior MI. Patient reports Friday developed acute diaphoresis, chest pain, and marked weakness. Did not seek medical attention. On Sunday developed more severe chest pain and throat tightness that lasted several hours.   TELEMETRY: Reviewed telemetry pt in NSR with rare PVC, triplet: Filed Vitals:   03/14/16 1154 03/14/16 2034 03/15/16 0030 03/15/16 0536  BP: 108/67 112/75 113/84 136/55  Pulse: 79 92 81 80  Temp: 97.8 F (36.6 C) 98.2 F (36.8 C) 98 F (36.7 C) 97.6 F (36.4 C)  TempSrc: Oral Oral Oral Oral  Resp: 20 20 20 20   Height: 5' 11.25" (1.81 m)     Weight: 251 lb 5.2 oz (114 kg)   244 lb 12.8 oz (111.041 kg)  SpO2: 96% 94% 98% 95%    Intake/Output Summary (Last 24 hours) at 03/15/16 0734 Last data filed at 03/15/16 0537  Gross per 24 hour  Intake    960 ml  Output   3250 ml  Net  -2290 ml   Filed Weights   03/13/16 0332 03/14/16 1154 03/15/16 0536  Weight: 260 lb 2.3 oz (118 kg) 251 lb 5.2 oz (114 kg) 244 lb 12.8 oz (111.041 kg)    Subjective Feels much better today. No chest pain or dyspnea.  Marland Kitchen. antiseptic oral rinse  7 mL Mouth Rinse BID  . aspirin EC  81 mg Oral Daily  . atorvastatin  80 mg Oral Daily  . insulin aspart  0-9 Units Subcutaneous TID WC  . levothyroxine  50 mcg Oral QAC breakfast  . methylPREDNISolone (SOLU-MEDROL) injection  40 mg Intravenous Q6H  . metoprolol succinate  25 mg Oral Daily  . pantoprazole  40 mg Oral Daily  . sacubitril-valsartan  1 tablet Oral BID  . sodium chloride flush  3 mL Intravenous Q12H  . sodium chloride flush  3 mL Intravenous Q12H   . sodium chloride 10 mL/hr at 03/13/16 0347  . sodium chloride    . heparin 1,800 Units/hr (03/14/16 0757)    LABS: Basic Metabolic Panel:  Recent Labs  16/06/9606/04/17 0416 03/15/16 0247  NA 136 139  K 3.4* 3.7  CL 102 104  CO2 25 28  GLUCOSE 207* 164*  BUN 15 13    CREATININE 0.73 0.69  CALCIUM 8.9 9.0   Liver Function Tests:  Recent Labs  03/13/16 0420 03/14/16 0416  AST 21 18  ALT 20 20  ALKPHOS 100 85  BILITOT 0.7 0.4  PROT 6.7 6.8  ALBUMIN 3.6 3.4*   No results for input(s): LIPASE, AMYLASE in the last 72 hours. CBC:  Recent Labs  03/14/16 0416 03/15/16 0247  WBC 21.6* 20.9*  HGB 15.6 16.1  HCT 46.7 48.1  MCV 98.5 97.8  PLT 135* 145*   Cardiac Enzymes:  Recent Labs  03/13/16 0420 03/13/16 0909 03/13/16 1520  TROPONINI 0.03* 0.03* <0.03   BNP: No results for input(s): PROBNP in the last 72 hours. D-Dimer:  Recent Labs  03/12/16 2310  DDIMER 0.87*   Hemoglobin A1C:  Recent Labs  03/13/16 0420  HGBA1C 6.3*   Fasting Lipid Panel: No results for input(s): CHOL, HDL, LDLCALC, TRIG, CHOLHDL, LDLDIRECT in the last 72 hours. Thyroid Function Tests:  Recent Labs  03/15/16 0247  TSH 0.695     Radiology/Studies:  [place] Venous Img Lower Bilateral  03/13/2016  CLINICAL DATA:  65 year old male with a history of lower extremity edema EXAM: BILATERAL  LOWER EXTREMITY VENOUS DOPPLER ULTRASOUND TECHNIQUE: Gray-scale sonography with graded compression, as well as color Doppler and duplex ultrasound were performed to evaluate the lower extremity deep venous systems from the level of the common femoral vein and including the common femoral, femoral, profunda femoral, popliteal and calf veins including the posterior tibial, peroneal and gastrocnemius veins when visible. The superficial great saphenous vein was also interrogated. Spectral Doppler was utilized to evaluate flow at rest and with distal augmentation maneuvers in the common femoral, femoral and popliteal veins. COMPARISON:  None. FINDINGS: RIGHT LOWER EXTREMITY Common Femoral Vein: No evidence of thrombus. Normal compressibility, respiratory phasicity and response to augmentation. Saphenofemoral Junction: No evidence of thrombus. Normal compressibility and flow on color  Doppler imaging. Profunda Femoral Vein: No evidence of thrombus. Normal compressibility and flow on color Doppler imaging. Femoral Vein: No evidence of thrombus. Normal compressibility, respiratory phasicity and response to augmentation. Popliteal Vein: No evidence of thrombus. Normal compressibility, respiratory phasicity and response to augmentation. Calf Veins: No thrombus of the posterior tibial vein. Peroneal vein not visualized completely. Superficial Great Saphenous Vein: No evidence of thrombus. Normal compressibility and flow on color Doppler imaging. Other Findings:  Edema of the lower extremity. LEFT LOWER EXTREMITY Common Femoral Vein: No evidence of thrombus. Normal compressibility, respiratory phasicity and response to augmentation. Saphenofemoral Junction: No evidence of thrombus. Normal compressibility and flow on color Doppler imaging. Profunda Femoral Vein: No evidence of thrombus. Normal compressibility and flow on color Doppler imaging. Femoral Vein: No evidence of thrombus. Normal compressibility, respiratory phasicity and response to augmentation. Popliteal Vein: No evidence of thrombus. Normal compressibility, respiratory phasicity and response to augmentation. Calf Veins: No evidence of thrombus. Normal compressibility and flow on color Doppler imaging. Superficial Great Saphenous Vein: No evidence of thrombus. Normal compressibility and flow on color Doppler imaging. Other Findings:  Edema of lower extremity. IMPRESSION: Sonographic survey bilateral lower extremities negative for DVT. Bilateral lower extremity edema. Signed, Yvone NeuJaime S. Loreta AveWagner, DO [facility] Electronically Signed   By: Gilmer MorJaime  Wagner D.O.   On: 03/13/2016 12:55   Ecg shows NSR with anterior infarct- new since 2012.  Echo: Study Conclusions  - Left ventricle: The cavity size was normal. Wall thickness was  increased increased in a pattern of mild to moderate  LVH.  Systolic function was moderately to severely reduced. The  estimated ejection fraction was in the range of 30% to 35%.  Doppler parameters are consistent with abnormal left ventricular  relaxation (grade 1 diastolic dysfunction). - Regional wall motion abnormality: Akinesis of the apical  anterior, mid anteroseptal, apical inferior, apical lateral, and  apical myocardium; hypokinesis of the mid anterior and basal  anteroseptal myocardium. - Aortic valve: Mildly calcified annulus. Trileaflet; mildly  thickened leaflets. Valve area (VTI): 2.58 cm^2. Valve area  (Vmax): 2.86 cm^2. - Left atrium: The atrium was mildly dilated. - Atrial septum: No defect or patent foramen ovale was identified. - Technically difficult study. Echocontrast was used to enhance  visualization.  PHYSICAL EXAM General: Well developed, well nourished, in no acute distress. Head: Normocephalic, atraumatic, sclera non-icteric, oropharynx is clear Neck: Negative for carotid bruits. JVD not elevated. No adenopathy Lungs: Clear bilaterally to auscultation without wheezes, rales, or rhonchi. Breathing is unlabored. Heart: RRR S1 S2 without murmurs, rubs, or gallops.  Abdomen: Soft, non-tender, non-distended with normoactive bowel sounds. No hepatomegaly. No rebound/guarding. No obvious abdominal masses. Msk:  Strength and tone appears normal for age. Extremities: No clubbing, cyanosis or edema.  Distal pedal pulses are 2+ and equal bilaterally. Neuro: Alert and oriented X 3. Moves all extremities spontaneously. Psych:  Responds to questions appropriately with a normal affect.  ASSESSMENT AND PLAN: 1. Subacute anterior MI. I suspect this started on Friday. At time of presentation Troponin minimally elevated. Ecg with new changes.  Echo shows regional wall motion abnormalities. CT chest shows heavy calcification in LAD territory. Now on IV heparin and pain free. Also on beta blocker, ASA, and statin. Plan  RLHC today with possible intervention. The procedure and risks were reviewed including but not limited to death, myocardial infarction, stroke, arrythmias, bleeding, transfusion, emergency surgery, dye allergy, or renal dysfunction. The patient voices understanding and is agreeable to proceed..  2. Acute systolic CHF with ischemic cardiomyopathy. EF 30-35%.Appears euvolemic now. On metoprolol and Entresto. Will switch Toprol to Coreg. Assess right heart and LV filling pressures by cath today.  3. Tobacco abuse. Patient states he plans on quitting now.   4. HTN controlled.   5. Hypercholesterolemia. On high dose statin.  6. Hyperglycemia. A1c 6.3%. Suspect newly diagnosed DM. Per primary team.   7. Elevated WBC. No clinical evidence of infection. I suspect related to steroids.  8. COPD. On Nebs and steroids per primary team.   Present on Admission:  . Chest pain . COPD exacerbation (HCC) . Essential hypertension, benign . Coronary atherosclerosis of native coronary artery  Signed, Travonte Byard, MDFACC 03/15/2016 7:34 AM      

## 2016-03-16 DIAGNOSIS — I2510 Atherosclerotic heart disease of native coronary artery without angina pectoris: Secondary | ICD-10-CM

## 2016-03-16 LAB — GLUCOSE, CAPILLARY
Glucose-Capillary: 108 mg/dL — ABNORMAL HIGH (ref 65–99)
Glucose-Capillary: 174 mg/dL — ABNORMAL HIGH (ref 65–99)
Glucose-Capillary: 134 mg/dL — ABNORMAL HIGH (ref 65–99)

## 2016-03-16 LAB — COMPREHENSIVE METABOLIC PANEL
ALT: 23 U/L (ref 17–63)
AST: 15 U/L (ref 15–41)
Albumin: 2.9 g/dL — ABNORMAL LOW (ref 3.5–5.0)
Alkaline Phosphatase: 73 U/L (ref 38–126)
Anion gap: 4 — ABNORMAL LOW (ref 5–15)
BUN: 18 mg/dL (ref 6–20)
CO2: 30 mmol/L (ref 22–32)
Calcium: 8.8 mg/dL — ABNORMAL LOW (ref 8.9–10.3)
Chloride: 105 mmol/L (ref 101–111)
Creatinine, Ser: 0.76 mg/dL (ref 0.61–1.24)
GFR calc Af Amer: >60 mL/min (ref >60)
GFR calc non Af Amer: >60 mL/min (ref >60)
Glucose, Bld: 123 mg/dL — ABNORMAL HIGH (ref 65–99)
Potassium: 3.8 mmol/L (ref 3.5–5.1)
Sodium: 139 mmol/L (ref 135–145)
Total Bilirubin: 0.3 mg/dL (ref 0.3–1.2)
Total Protein: 5.9 g/dL — ABNORMAL LOW (ref 6.5–8.1)

## 2016-03-16 LAB — CBC
HCT: 46.8 % (ref 39.0–52.0)
Hemoglobin: 15.5 g/dL (ref 13.0–17.0)
MCH: 32.8 pg (ref 26.0–34.0)
MCHC: 33.1 g/dL (ref 30.0–36.0)
MCV: 98.9 fL (ref 78.0–100.0)
Platelets: 137 10*3/uL — ABNORMAL LOW (ref 150–400)
RBC: 4.73 MIL/uL (ref 4.22–5.81)
RDW: 15.2 % (ref 11.5–15.5)
WBC: 13.2 10*3/uL — ABNORMAL HIGH (ref 4.0–10.5)

## 2016-03-16 MED ORDER — CLOPIDOGREL BISULFATE 75 MG PO TABS
75.0000 mg | ORAL_TABLET | Freq: Every day | ORAL | Status: DC
  Administered 2016-03-16: 75 mg via ORAL
  Filled 2016-03-16: qty 1

## 2016-03-16 MED ORDER — CARVEDILOL 12.5 MG PO TABS: 12.5000 mg | ORAL_TABLET | Freq: Two times a day (BID) | ORAL | Status: DC

## 2016-03-16 MED ORDER — ATORVASTATIN CALCIUM 80 MG PO TABS: 80.0000 mg | ORAL_TABLET | Freq: Every day | ORAL | Status: DC

## 2016-03-16 MED ORDER — PREDNISONE 50 MG PO TABS: 50.0000 mg | ORAL_TABLET | Freq: Every day | ORAL | Status: DC

## 2016-03-16 MED ORDER — CLOPIDOGREL BISULFATE 75 MG PO TABS: 75.0000 mg | ORAL_TABLET | Freq: Every day | ORAL | Status: DC

## 2016-03-16 MED ORDER — SACUBITRIL-VALSARTAN 24-26 MG PO TABS: 1.0000 | ORAL_TABLET | Freq: Two times a day (BID) | ORAL | Status: DC

## 2016-03-16 NOTE — Progress Notes (Signed)
Patient to be discharged home on Entresto; patient was given a 2 wk started supply of medication by the physician and she is completing paperwork for authorization ; pharmacy of choice is [facility] in [place]; Leonard SchwartzB Allisonhandler RN,MHA,BSM [phone]

## 2016-03-16 NOTE — Discharge Summary (Signed)
Physician Discharge Summary  Franco Duley MRN: [id number] DOB/AGE: 64-07-1951 64 y.o.  PCP: Sherrie Mustache, MD   Admit date: 03/12/2016 Discharge date: 03/16/2016  Discharge Diagnoses:     Principal Problem:   Non-acute transmural anterior myocardial infarction within last eight weeks Active Problems:   Coronary atherosclerosis of native coronary artery   Essential hypertension, benign   Chest pain   COPD exacerbation (HCC)   Pain in the chest    Follow-up recommendations Follow-up with PCP in 3-5 days , including all  additional recommended appointments as below Follow-up CBC, CMP in 3-5 days As per cardiology  Plan to repeat assessment of LV function in 6-8 weeks. If EF still low consider ICD. Will refer to phase 2 Cardiac Rehab program at [facility]. Will arrange follow up in our [place] office.      Current Discharge Medication List    START taking these medications   Details  carvedilol (COREG) 12.5 MG tablet Take 1 tablet (12.5 mg total) by mouth 2 (two) times daily with a meal. Qty: 60 tablet, Refills: 1    clopidogrel (PLAVIX) 75 MG tablet Take 1 tablet (75 mg total) by mouth daily. Qty: 30 tablet, Refills: 3    predniSONE (DELTASONE) 50 MG tablet Take 1 tablet (50 mg total) by mouth daily with breakfast. Qty: 5 tablet, Refills: 0    sacubitril-valsartan (ENTRESTO) 24-26 MG Take 1 tablet by mouth 2 (two) times daily. Qty: 60 tablet, Refills: 2      CONTINUE these medications which have CHANGED   Details  atorvastatin (LIPITOR) 80 MG tablet Take 1 tablet (80 mg total) by mouth daily. Qty: 30 tablet, Refills: 1      CONTINUE these medications which have NOT CHANGED   Details  acetaminophen (TYLENOL) 650 MG CR tablet Take one by mouth twice daily     aspirin 81 MG tablet Take 81 mg by mouth daily.      Cyanocobalamin (VITAMIN B-12) 2500 MCG SUBL Take one by mouth twice daily      levothyroxine (SYNTHROID) 100 MCG tablet Take 100 mcg by mouth  daily before breakfast.     metoprolol succinate (TOPROL-XL) 25 MG 24 hr tablet Take 25 mg by mouth daily.    Omega-3 Fatty Acids (FISH OIL) 1200 MG CAPS Take one by mouth twice daily      omeprazole (PRILOSEC) 20 MG capsule Take 20 mg by mouth daily.      pravastatin (PRAVACHOL) 40 MG tablet Take 40 mg by mouth daily.      STOP taking these medications     triamterene-hydrochlorothiazide (MAXZIDE-25) 37.5-25 MG tablet      vardenafil (LEVITRA) 20 MG tablet          Discharge Condition: Stable    Discharge Instructions Get Medicines reviewed and adjusted: Please take all your medications with you for your next visit with your Primary MD  Please request your Primary MD to go over all hospital tests and procedure/radiological results at the follow up, please ask your Primary MD to get all Hospital records sent to his/her office.  If you experience worsening of your admission symptoms, develop shortness of breath, life threatening emergency, suicidal or homicidal thoughts you must seek medical attention immediately by calling 911 or calling your MD immediately if symptoms less severe.  You must read complete instructions/literature along with all the possible adverse reactions/side effects for all the Medicines you take and that have been prescribed to you. Take any new Medicines after you  have completely understood and accpet all the possible adverse reactions/side effects.   Do not drive when taking Pain medications.   Do not take more than prescribed Pain, Sleep and Anxiety Medications  Special Instructions: If you have smoked or chewed Tobacco in the last 2 yrs please stop smoking, stop any regular Alcohol and or any Recreational drug use.  Wear Seat belts while driving.  Please note  You were cared for by a hospitalist during your hospital stay. Once you are discharged, your primary care physician will handle any further medical issues. Please note that NO REFILLS  for any discharge medications will be authorized once you are discharged, as it is imperative that you return to your primary care physician (or establish a relationship with a primary care physician if you do not have one) for your aftercare needs so that they can reassess your need for medications and monitor your lab values.  Discharge Instructions    Amb Referral to Cardiac Rehabilitation    Complete by:  As directed   Diagnosis:  NSTEMI            No Known Allergies    Disposition: Home with wife   Consults: * Cardiology  Significant Diagnostic Studies:  Dg Chest 2 View  03/13/2016  CLINICAL DATA:  Chest pain for 5 hours. Shortness of breath, nausea and diaphoresis. EXAM: CHEST  2 VIEW COMPARISON:  Chest radiograph 09/30/2013 FINDINGS: Heart size and mediastinal contours are unchanged. Bronchitic markings appear chronic. No focal airspace disease, pulmonary edema, pneumothorax or pleural effusion. There is degenerative change in the lower thoracic and upper lumbar spine. IMPRESSION: Chronic bronchitic change.  No acute process. Electronically Signed   By: Jeb Levering M.D.   On: 03/13/2016 00:23   Ct Angio Chest Pe W/cm &/or Wo Cm  03/13/2016  CLINICAL DATA:  Chest pain and shortness of breath since last night. Elevated D-dimer. EXAM: CT ANGIOGRAPHY CHEST WITH CONTRAST TECHNIQUE: Multidetector CT imaging of the chest was performed using the standard protocol during bolus administration of intravenous contrast. Multiplanar CT image reconstructions and MIPs were obtained to evaluate the vascular anatomy. CONTRAST:  100 cc Isovue 370 IV COMPARISON:  Radiographs 2 hours prior. Radiograph 09/30/2013 also reviewed. FINDINGS: Cardiovascular: There are no filling defects within the pulmonary arteries to suggest pulmonary embolus. Minimal atherosclerosis of the aortic arch. No aortic dissection or aneurysm. Mild distal aortic tortuosity. There are coronary artery calcifications.  Mediastinum/Nodes: Prominent right upper paratracheal node measures 9 mm short axis, borderline. No additional mediastinal or hilar adenopathy. No pericardial fluid. Lungs/Pleura: Central bronchial thickening most prominent in the lower lobes. Minimal dependent atelectasis. Breathing motion artifact obscures dependent left lower lobe evaluation. No consolidation to suggest pneumonia. No pulmonary mass. No suspicious nodule allowing for breathing motion. Upper Abdomen: No acute abnormality. Musculoskeletal: Multilevel degenerative change throughout the spine. There are no acute or suspicious osseous abnormalities. Review of the MIP images confirms the above findings. IMPRESSION: 1. No pulmonary embolus. 2. Bronchial thickening, progressed from prior radiograph. 3. Mild aortic atherosclerosis.  Coronary artery calcifications. Electronically Signed   By: Jeb Levering M.D.   On: 03/13/2016 01:52   US Venous Img Lower Bilateral  03/13/2016  CLINICAL DATA:  65 year old male with a history of lower extremity edema EXAM: BILATERAL LOWER EXTREMITY VENOUS DOPPLER ULTRASOUND TECHNIQUE: Gray-scale sonography with graded compression, as well as color Doppler and duplex ultrasound were performed to evaluate the lower extremity deep venous systems from the level of the common femoral vein  and including the common femoral, femoral, profunda femoral, popliteal and calf veins including the posterior tibial, peroneal and gastrocnemius veins when visible. The superficial great saphenous vein was also interrogated. Spectral Doppler was utilized to evaluate flow at rest and with distal augmentation maneuvers in the common femoral, femoral and popliteal veins. COMPARISON:  None. FINDINGS: RIGHT LOWER EXTREMITY Common Femoral Vein: No evidence of thrombus. Normal compressibility, respiratory phasicity and response to augmentation. Saphenofemoral Junction: No evidence of thrombus. Normal compressibility and flow on color Doppler  imaging. Profunda Femoral Vein: No evidence of thrombus. Normal compressibility and flow on color Doppler imaging. Femoral Vein: No evidence of thrombus. Normal compressibility, respiratory phasicity and response to augmentation. Popliteal Vein: No evidence of thrombus. Normal compressibility, respiratory phasicity and response to augmentation. Calf Veins: No thrombus of the posterior tibial vein. Peroneal vein not visualized completely. Superficial Great Saphenous Vein: No evidence of thrombus. Normal compressibility and flow on color Doppler imaging. Other Findings:  Edema of the lower extremity. LEFT LOWER EXTREMITY Common Femoral Vein: No evidence of thrombus. Normal compressibility, respiratory phasicity and response to augmentation. Saphenofemoral Junction: No evidence of thrombus. Normal compressibility and flow on color Doppler imaging. Profunda Femoral Vein: No evidence of thrombus. Normal compressibility and flow on color Doppler imaging. Femoral Vein: No evidence of thrombus. Normal compressibility, respiratory phasicity and response to augmentation. Popliteal Vein: No evidence of thrombus. Normal compressibility, respiratory phasicity and response to augmentation. Calf Veins: No evidence of thrombus. Normal compressibility and flow on color Doppler imaging. Superficial Great Saphenous Vein: No evidence of thrombus. Normal compressibility and flow on color Doppler imaging. Other Findings:  Edema of lower extremity. IMPRESSION: Sonographic survey bilateral lower extremities negative for DVT. Bilateral lower extremity edema. Signed, Dulcy Fanny. Earleen Newport, DO [facility] Radiology Electronically Signed   By: Corrie Mckusick D.O.   On: 03/13/2016 12:55    2-D echo LV EF: 30% - 35%  ------------------------------------------------------------------- Indications: Chest pain  786.51.  ------------------------------------------------------------------- History: PMH: Angina pectoris. Coronary artery disease. Chronic obstructive pulmonary disease. Risk factors: Current tobacco use. Hypertension. Dyslipidemia.  ------------------------------------------------------------------- Study Conclusions  - Left ventricle: The cavity size was normal. Wall thickness was  increased increased in a pattern of mild to moderate LVH.  Systolic function was moderately to severely reduced. The  estimated ejection fraction was in the range of 30% to 35%.  Doppler parameters are consistent with abnormal left ventricular  relaxation (grade 1 diastolic dysfunction). - Regional wall motion abnormality: Akinesis of the apical  anterior, mid anteroseptal, apical inferior, apical lateral, and  apical myocardium; hypokinesis of the mid anterior and basal  anteroseptal myocardium. - Aortic valve: Mildly calcified annulus. Trileaflet; mildly  thickened leaflets. Valve area (VTI): 2.58 cm^2. Valve area  (Vmax): 2.86 cm^2. - Left atrium: The atrium was mildly dilated. - Atrial septum: No defect or patent foramen ovale was identified. - Technically difficult study. Echocontrast was used to enhance  visualization.   Right/Left Heart Cath and Coronary Angiography    Conclusion    1. Prox LAD lesion, 100% stenosed. 2. Ost RCA lesion, 20% stenosed. 3. There is moderate to severe left ventricular systolic dysfunction.  1. Mildly elevated filling pressures with mild pulmonary hypertension. Normal cardiac output. Mean pulmonary capillary wedge pressure was 18 mmHg. 2. Severe one-vessel coronary artery disease with an occluded proximal LAD after giving a large diagonal branch which supplies a large territory of the anterolateral wall. Otherwise no obstructive disease. Left dominant system. Ostial RCA spasm improved with  nitroglycerin. Very faint right to left and left to  left collaterals to the LAD. 3. Moderately reduced LV systolic function with an ejection fraction of 35%. Akinesis of mid to distal anterior and apical wall.  Recommendations: Occluded proximal LAD of more than 72 hours of duration. With the lack of current anginal symptoms, there is no indication for revascularization. I recommend continuing medical therapy for coronary artery disease and ischemic cardiomyopathy. LAD PCI can be considered in the future for refractory anginal symptoms if there is anterior viability.      Filed Weights   03/14/16 1154 03/15/16 0536 03/16/16 0533  Weight: 114 kg (251 lb 5.2 oz) 111.041 kg (244 lb 12.8 oz) 113.807 kg (250 lb 14.4 oz)     Microbiology: No results found for this or any previous visit (from the past 240 hour(s)).     Blood Culture No results found for: SDES, [place], CULT, REPTSTATUS    Labs: Results for orders placed or performed during the hospital encounter of 03/12/16 (from the past 48 hour(s))  Glucose, capillary     Status: Abnormal   Collection Time: 03/14/16 12:03 PM  Result Value Ref Range   Glucose-Capillary 210 (H) 65 - 99 mg/dL  Heparin level (unfractionated)     Status: None   Collection Time: 03/14/16  1:44 PM  Result Value Ref Range   Heparin Unfractionated 0.62 0.30 - 0.70 IU/mL    Comment:        IF HEPARIN RESULTS ARE BELOW EXPECTED VALUES, AND PATIENT DOSAGE HAS BEEN CONFIRMED, SUGGEST FOLLOW UP TESTING OF ANTITHROMBIN III LEVELS.   Glucose, capillary     Status: Abnormal   Collection Time: 03/14/16  4:46 PM  Result Value Ref Range   Glucose-Capillary 244 (H) 65 - 99 mg/dL   Comment 1 Notify RN   Heparin level (unfractionated)     Status: None   Collection Time: 03/14/16  8:26 PM  Result Value Ref Range   Heparin Unfractionated 0.54 0.30 - 0.70 IU/mL    Comment:        IF HEPARIN RESULTS ARE BELOW EXPECTED VALUES, AND PATIENT DOSAGE HAS BEEN CONFIRMED, SUGGEST FOLLOW UP TESTING OF ANTITHROMBIN  III LEVELS.   CBC     Status: Abnormal   Collection Time: 03/15/16  2:47 AM  Result Value Ref Range   WBC 20.9 (H) 4.0 - 10.5 K/uL   RBC 4.92 4.22 - 5.81 MIL/uL   Hemoglobin 16.1 13.0 - 17.0 g/dL   HCT 48.1 39.0 - 52.0 %   MCV 97.8 78.0 - 100.0 fL   MCH 32.7 26.0 - 34.0 pg   MCHC 33.5 30.0 - 36.0 g/dL   RDW 14.9 11.5 - 15.5 %   Platelets 145 (L) 150 - 400 K/uL  Basic metabolic panel     Status: Abnormal   Collection Time: 03/15/16  2:47 AM  Result Value Ref Range   Sodium 139 135 - 145 mmol/L   Potassium 3.7 3.5 - 5.1 mmol/L   Chloride 104 101 - 111 mmol/L   CO2 28 22 - 32 mmol/L   Glucose, Bld 164 (H) 65 - 99 mg/dL   BUN 13 6 - 20 mg/dL   Creatinine, Ser 0.69 0.61 - 1.24 mg/dL   Calcium 9.0 8.9 - 10.3 mg/dL   GFR calc non Af Amer >60 >60 mL/min   GFR calc Af Amer >60 >60 mL/min    Comment: (NOTE) The eGFR has been calculated using the CKD EPI equation. This calculation has  not been validated in all clinical situations. eGFR's persistently <60 mL/min signify possible Chronic Kidney Disease.    Anion gap 7 5 - 15  Heparin level (unfractionated)     Status: None   Collection Time: 03/15/16  2:47 AM  Result Value Ref Range   Heparin Unfractionated 0.54 0.30 - 0.70 IU/mL    Comment:        IF HEPARIN RESULTS ARE BELOW EXPECTED VALUES, AND PATIENT DOSAGE HAS BEEN CONFIRMED, SUGGEST FOLLOW UP TESTING OF ANTITHROMBIN III LEVELS.   TSH     Status: None   Collection Time: 03/15/16  2:47 AM  Result Value Ref Range   TSH 0.695 0.350 - 4.500 uIU/mL  Protime-INR     Status: None   Collection Time: 03/15/16  6:27 AM  Result Value Ref Range   Prothrombin Time 14.1 11.6 - 15.2 seconds   INR 1.07 0.00 - 1.49  Glucose, capillary     Status: Abnormal   Collection Time: 03/15/16  7:14 AM  Result Value Ref Range   Glucose-Capillary 149 (H) 65 - 99 mg/dL  I-STAT 3, venous blood gas (G3P V)     Status: Abnormal   Collection Time: 03/15/16 11:53 AM  Result Value Ref Range   pH,  Ven 7.354 (H) 7.250 - 7.300   pCO2, Ven 54.6 (H) 45.0 - 50.0 mmHg   pO2, Ven 40.0 31.0 - 45.0 mmHg   Bicarbonate 30.4 (H) 20.0 - 24.0 mEq/L   TCO2 32 0 - 100 mmol/L   O2 Saturation 71.0 %   Acid-Base Excess 3.0 (H) 0.0 - 2.0 mmol/L   Patient temperature HIDE    Sample type VENOUS   I-STAT 3, arterial blood gas (G3+)     Status: Abnormal   Collection Time: 03/15/16 11:54 AM  Result Value Ref Range   pH, Arterial 7.353 7.350 - 7.450   pCO2 arterial 50.4 (H) 35.0 - 45.0 mmHg   pO2, Arterial 76.0 (L) 80.0 - 100.0 mmHg   Bicarbonate 28.0 (H) 20.0 - 24.0 mEq/L   TCO2 29 0 - 100 mmol/L   O2 Saturation 94.0 %   Acid-Base Excess 1.0 0.0 - 2.0 mmol/L   Patient temperature HIDE    Sample type ARTERIAL   Glucose, capillary     Status: Abnormal   Collection Time: 03/15/16  5:04 PM  Result Value Ref Range   Glucose-Capillary 299 (H) 65 - 99 mg/dL   Comment 1 Notify RN   Glucose, capillary     Status: Abnormal   Collection Time: 03/15/16  9:38 PM  Result Value Ref Range   Glucose-Capillary 197 (H) 65 - 99 mg/dL   Comment 1 Notify RN    Comment 2 Document in Chart   CBC     Status: Abnormal   Collection Time: 03/16/16  2:08 AM  Result Value Ref Range   WBC 13.2 (H) 4.0 - 10.5 K/uL   RBC 4.73 4.22 - 5.81 MIL/uL   Hemoglobin 15.5 13.0 - 17.0 g/dL   HCT 46.8 39.0 - 52.0 %   MCV 98.9 78.0 - 100.0 fL   MCH 32.8 26.0 - 34.0 pg   MCHC 33.1 30.0 - 36.0 g/dL   RDW 15.2 11.5 - 15.5 %   Platelets 137 (L) 150 - 400 K/uL  Comprehensive metabolic panel     Status: Abnormal   Collection Time: 03/16/16  2:08 AM  Result Value Ref Range   Sodium 139 135 - 145 mmol/L   Potassium 3.8 3.5 -  5.1 mmol/L   Chloride 105 101 - 111 mmol/L   CO2 30 22 - 32 mmol/L   Glucose, Bld 123 (H) 65 - 99 mg/dL   BUN 18 6 - 20 mg/dL   Creatinine, Ser 0.76 0.61 - 1.24 mg/dL   Calcium 8.8 (L) 8.9 - 10.3 mg/dL   Total Protein 5.9 (L) 6.5 - 8.1 g/dL   Albumin 2.9 (L) 3.5 - 5.0 g/dL   AST 15 15 - 41 U/L   ALT 23 17 -  63 U/L   Alkaline Phosphatase 73 38 - 126 U/L   Total Bilirubin 0.3 0.3 - 1.2 mg/dL   GFR calc non Af Amer >60 >60 mL/min   GFR calc Af Amer >60 >60 mL/min    Comment: (NOTE) The eGFR has been calculated using the CKD EPI equation. This calculation has not been validated in all clinical situations. eGFR's persistently <60 mL/min signify possible Chronic Kidney Disease.    Anion gap 4 (L) 5 - 15  Glucose, capillary     Status: Abnormal   Collection Time: 03/16/16  5:39 AM  Result Value Ref Range   Glucose-Capillary 108 (H) 65 - 99 mg/dL   Comment 1 Notify RN    Comment 2 Document in Chart      Lipid Panel  No results found for: CHOL, TRIG, HDL, CHOLHDL, VLDL, LDLCALC, LDLDIRECT   Lab Results  Component Value Date   HGBA1C 6.3* 03/13/2016     Lab Results  Component Value Date   CREATININE 0.76 03/16/2016     HPI   [name] is a 65 y.o.male self reported history of MI in May 2005 that was managed medically, tobacco abuse, HL, HTN, COPD, admitted with chest pain.He reports a several month history of intermittent chest pain, tightness that lasts just a few seconds. Significant DOE over the last few months, for example doing mild housework like sweeping the floor. He often has to stop and rest, and then resume his work. Last night while sitting at rest had 10/10 tightness extending from throat into midchest with associated diaphoresis and SOB. Not positional. Would come and go all night long. He also reports recent wheezing, chronic cough.  Echo 2012: LVEF 50-35%, grade I diastolic dysfunction,  4656 Nuclear stress: no evidence of ischemia D-dimer 0.87, BNP 361, WBC 10.9, Plt 124, Hgb 17, K 3.5, Cr 0.87,  Trop 0.04-->0.03-->0.03 CT PE: no PE, mild coronary calcification CXR chronic bronchitic changes EKG SR, LAFB, anteroseptal and anterior Qwaves, lateral TWIs (Q-waves and TWIs appear new compared to 2012 EKG) Cardiology consulted for further evaluation  HOSPITAL  COURSE:   4. COPD exacerbation-initially treated with Solu-Medrol , switched to Prednisone PO, continue for another 5 days then DC, DuoNeb every 6 hours, no pneumonia on CT, no PE 5. Chest pain/Subacute anterior MI. has mild elevation of troponin 0.04, EKG showing T-wave inversions in the anterior chest leads V 2-6, follow serial troponin and obtain . They have concern for possible cardiac ischemia.CT chest shows heavy calcification in LAD territory. Initially placed on IV heparin  . Also on beta blocker, ASA, and statin. Status post [facility] . EF 30-35%.    Cath shows Occluded proximal LAD of more than 72 hours of duration. With the lack of current anginal symptoms, there is no indication for revascularization. Crds recommends continuing medical therapy for coronary artery disease and ischemic cardiomyopathy.LAD PCI can be considered in the future for refractory anginal symptoms if there is anterior viability.On beta blocker, ASA, and  statin. Cardiology added plavix 75 mg daily. Cardiac Rehab to see. Will arrange [facility] at [place]. Plan to repeat assessment of LV function in 6-8 weeks. If EF still low consider ICD. Will refer to phase 2 Cardiac Rehab program at [facility]. Will arrange follow up in our [place] office. Patient okay to be discharged later today by cardiology 6. Hypertension- On Coreg and Entresto 7. Hypothyroidism- continue Synthroid 8. Hyperglycemia-hemoglobin A1c 6.3, continue sliding scale insulin, repeat hemoglobin A1c in 3 months 9. Abnormal d-dimer-venous Doppler negative for DVT   Discharge Exam:   Blood pressure 101/66, pulse 62, temperature 97.8 F (36.6 C), temperature source Oral, resp. rate 20, height 5' 11.25" (1.81 m), weight 113.807 kg (250 lb 14.4 oz), SpO2 94 %.  General: Well developed, well nourished, in no acute distress. Head: Normocephalic, atraumatic, sclera non-icteric, oropharynx is clear Neck: Negative for carotid bruits. JVD not elevated. No  adenopathy Lungs: Clear bilaterally to auscultation without wheezes, rales, or rhonchi. Breathing is unlabored. Heart: RRR S1 S2 without murmurs, rubs, or gallops.  Abdomen: Soft, non-tender, non-distended with normoactive bowel sounds. No hepatomegaly. No rebound/guarding. No obvious abdominal masses. Msk: Strength and tone appears normal for age. Extremities: No clubbing, cyanosis or edema. Distal pedal pulses are 2+ and equal bilaterally. No radial hematoma Neuro: Alert and oriented X 3. Moves all extremities spontaneously. Psych: Responds to questions appropriately with a normal affect.    Follow-up Information    Follow up with Jory Sims, NP.   Specialties:  Nurse Practitioner, Radiology, Cardiology   Why:  [place] on 03/30/2016 at 2:30PM. (Dr. McDowell's Office in [place]).   Contact information:   [place] [address] [phone]       Follow up with Sherrie Mustache, MD. Schedule an appointment as soon as possible for a visit in 3 days.   Specialty:  Family Medicine   Contact information:   [place] [address] [phone]       Signed: Reyne Dumas 03/16/2016, 10:28 AM        Time spent >45 mins

## 2016-03-16 NOTE — Progress Notes (Signed)
Entresto coupon card given to patient with spouse presentAlexis Barrera; B Dsean Vantol RN,MHA,BSN [phone]

## 2016-03-16 NOTE — Progress Notes (Signed)
CM talked to Kelly with [employer], they are obtaining authorization / in process; B Ashea Winiarski RN,MHA,Bsn [phone] 

## 2016-03-16 NOTE — Progress Notes (Signed)
65yo WM with history of tobacco abuse, COPD, HTN, HL transferred from [facility] with subacute anterior MI. Patient reports Friday developed acute diaphoresis, chest pain, and marked weakness. Did not seek medical attention. On Sunday developed more severe chest pain and throat tightness that lasted several hours.   TELEMETRY: Reviewed telemetry pt in NSR  Filed Vitals:   03/15/16 1500 03/15/16 2018 03/16/16 0005 03/16/16 0533  BP: 99/61 104/63 102/72 101/66  Pulse: 68 78 74 62  Temp:  98.2 F (36.8 C) 97.7 F (36.5 C) 97.8 F (36.6 C)  TempSrc:  Oral Oral Oral  Resp:  18 18 20   Height:      Weight:    250 lb 14.4 oz (113.807 kg)  SpO2:  95% 94% 94%    Intake/Output Summary (Last 24 hours) at 03/16/16 0823 Last data filed at 03/16/16 0545  Gross per 24 hour  Intake    900 ml  Output   1000 ml  Net   -100 ml   Filed Weights   03/14/16 1154 03/15/16 0536 03/16/16 0533  Weight: 251 lb 5.2 oz (114 kg) 244 lb 12.8 oz (111.041 kg) 250 lb 14.4 oz (113.807 kg)    Subjective Feels much better today. No chest pain or dyspnea.  Marland Kitchen. antiseptic oral rinse  7 mL Mouth Rinse BID  . aspirin EC  81 mg Oral Daily  . atorvastatin  80 mg Oral Daily  . carvedilol  12.5 mg Oral BID WC  . clopidogrel  75 mg Oral Daily  . enoxaparin (LOVENOX) injection  40 mg Subcutaneous Q24H  . insulin aspart  0-9 Units Subcutaneous TID WC  . levothyroxine  50 mcg Oral QAC breakfast  . pantoprazole  40 mg Oral Daily  . predniSONE  60 mg Oral Q breakfast  . sacubitril-valsartan  1 tablet Oral BID  . sodium chloride flush  3 mL Intravenous Q12H  . sodium chloride flush  3 mL Intravenous Q12H   . sodium chloride 10 mL/hr at 03/13/16 0347    LABS: Basic Metabolic Panel:  Recent Labs  16/06/9606/05/17 0247 03/16/16 0208  NA 139 139  K 3.7 3.8  CL 104 105  CO2 28 30  GLUCOSE 164* 123*  BUN 13 18  CREATININE 0.69 0.76  CALCIUM 9.0 8.8*   Liver Function Tests:  Recent Labs  03/14/16 0416  03/16/16 0208  AST 18 15  ALT 20 23  ALKPHOS 85 73  BILITOT 0.4 0.3  PROT 6.8 5.9*  ALBUMIN 3.4* 2.9*   No results for input(s): LIPASE, AMYLASE in the last 72 hours. CBC:  Recent Labs  03/15/16 0247 03/16/16 0208  WBC 20.9* 13.2*  HGB 16.1 15.5  HCT 48.1 46.8  MCV 97.8 98.9  PLT 145* 137*   Cardiac Enzymes:  Recent Labs  03/13/16 0909 03/13/16 1520  TROPONINI 0.03* <0.03   BNP: No results for input(s): PROBNP in the last 72 hours. D-Dimer: No results for input(s): DDIMER in the last 72 hours. Hemoglobin A1C: No results for input(s): HGBA1C in the last 72 hours. Fasting Lipid Panel: No results for input(s): CHOL, HDL, LDLCALC, TRIG, CHOLHDL, LDLDIRECT in the last 72 hours. Thyroid Function Tests:  Recent Labs  03/15/16 0247  TSH 0.695     Radiology/Studies:  No results found. Ecg shows NSR with anterior infarct- new since 2012.  Echo: Study Conclusions  - Left ventricle: The cavity size was normal. Wall thickness was  increased increased in a pattern of mild to  moderate LVH.  Systolic function was moderately to severely reduced. The  estimated ejection fraction was in the range of 30% to 35%.  Doppler parameters are consistent with abnormal left ventricular  relaxation (grade 1 diastolic dysfunction). - Regional wall motion abnormality: Akinesis of the apical  anterior, mid anteroseptal, apical inferior, apical lateral, and  apical myocardium; hypokinesis of the mid anterior and basal  anteroseptal myocardium. - Aortic valve: Mildly calcified annulus. Trileaflet; mildly  thickened leaflets. Valve area (VTI): 2.58 cm^2. Valve area  (Vmax): 2.86 cm^2. - Left atrium: The atrium was mildly dilated. - Atrial septum: No defect or patent foramen ovale was identified. - Technically difficult study. Echocontrast was used to enhance  visualization.  Procedures    Right/Left Heart Cath and Coronary Angiography    Conclusion     Prox LAD  lesion, 100% stenosed.  Ost RCA lesion, 20% stenosed.  There is moderate to severe left ventricular systolic dysfunction.  1. Mildly elevated filling pressures with mild pulmonary hypertension. Normal cardiac output. Mean pulmonary capillary wedge pressure was 18 mmHg. 2. Severe one-vessel coronary artery disease with an occluded proximal LAD after giving a large diagonal branch which supplies a large territory of the anterolateral wall. Otherwise no obstructive disease. Left dominant system. Ostial RCA spasm improved with nitroglycerin. Very faint right to left and left to left collaterals to the LAD. 3. Moderately reduced LV systolic function with an ejection fraction of 35%. Akinesis of mid to distal anterior and apical wall.  Recommendations: Occluded proximal LAD of more than 72 hours of duration. With the lack of current anginal symptoms, there is no indication for revascularization. I recommend continuing medical therapy for coronary artery disease and ischemic cardiomyopathy. LAD PCI can be considered in the future for refractory anginal symptoms if there is anterior viability.       PHYSICAL EXAM General: Well developed, well nourished, in no acute distress. Head: Normocephalic, atraumatic, sclera non-icteric, oropharynx is clear Neck: Negative for carotid bruits. JVD not elevated. No adenopathy Lungs: Clear bilaterally to auscultation without wheezes, rales, or rhonchi. Breathing is unlabored. Heart: RRR S1 S2 without murmurs, rubs, or gallops.  Abdomen: Soft, non-tender, non-distended with normoactive bowel sounds. No hepatomegaly. No rebound/guarding. No obvious abdominal masses. Msk:  Strength and tone appears normal for age. Extremities: No clubbing, cyanosis or edema.  Distal pedal pulses are 2+ and equal bilaterally. No radial hematoma Neuro: Alert and oriented X 3. Moves all extremities spontaneously. Psych:  Responds to questions appropriately with a normal  affect.  ASSESSMENT AND PLAN: 1. Subacute anterior MI. I suspect this started on Friday. At time of presentation Troponin minimally elevated. Ecg with new changes.  Echo shows regional wall motion abnormalities. CT chest shows heavy calcification in LAD territory. Cardiac cath shows occluded LAD without other disease. On beta blocker, ASA, and statin. Add plavix 75 mg daily.  Cardiac Rehab to see. Will arrange Lifevest at DC. Plan to repeat assessment of LV function in 6-8 weeks. If EF still low consider ICD. Will refer to phase 2 Cardiac Rehab program at [facility]. Will arrange follow up in our [place] office. Patient stable for DC later today.   2. Acute systolic CHF with ischemic cardiomyopathy. EF 30-35%.Appears euvolemic now. On Coreg and Entresto. BP fairly soft now. Would consider addition of aldactone as outpatient.  3. Tobacco abuse. Patient states he plans on quitting now.   4. HTN controlled.   5. Hypercholesterolemia. On high dose statin.  6. Hyperglycemia. A1c 6.3%. Suspect  newly diagnosed DM. Per primary team.   7. Elevated WBC. No clinical evidence of infection. I suspect related to steroids.  8. COPD. On Nebs and steroids per primary team.   Present on Admission:  . Chest pain . COPD exacerbation (HCC) . Essential hypertension, benign . Coronary atherosclerosis of native coronary artery . Non-acute transmural anterior myocardial infarction within last eight weeks  Signed, Ariyan Brisendine SwazilandJordan, MDFACC 03/16/2016 8:23 AM

## 2016-03-16 NOTE — Progress Notes (Signed)
CARDIAC REHAB PHASE I   PRE:  Rate/Rhythm: 66 SR    BP: sitting 92/65    SaO2: 95 RA  MODE:  Ambulation: 460 ft   POST:  Rate/Rhythm: 83 SR    BP: sitting 109/70     SaO2: 99 RA  Tolerated well, no c/o. Admitted to slight SOB toward end. Ed completed including MI, HF, smoking cessation, daily wts, low sodium, preDM diet, ex, NTG, and CRPII. Gave pt videos to watch including Lifevest, HF, and MI. [facility] be [facility] to [facility]. Pt sts he plans to quit smoking. Able to perform some teach back.  [id number]   Harriet MassonRandi Kristan Dalina Samara CES, ACSM 03/16/2016 10:11 AM

## 2016-03-30 ENCOUNTER — Encounter: Payer: Medicaid Other | Admitting: Adult Health

## 2016-04-04 ENCOUNTER — Ambulatory Visit (INDEPENDENT_AMBULATORY_CARE_PROVIDER_SITE_OTHER): Payer: Medicaid Other | Admitting: Adult Health

## 2016-04-04 ENCOUNTER — Encounter: Payer: Self-pay | Admitting: Adult Health

## 2016-04-04 VITALS — BP 100/54 | HR 84 | Ht 71.0 in | Wt 244.0 lb

## 2016-04-04 DIAGNOSIS — I2510 Atherosclerotic heart disease of native coronary artery without angina pectoris: Secondary | ICD-10-CM | POA: Diagnosis not present

## 2016-04-04 DIAGNOSIS — I952 Hypotension due to drugs: Secondary | ICD-10-CM

## 2016-04-04 DIAGNOSIS — Z720 Tobacco use: Secondary | ICD-10-CM

## 2016-04-04 DIAGNOSIS — I519 Heart disease, unspecified: Secondary | ICD-10-CM

## 2016-04-04 NOTE — Progress Notes (Signed)
Name: Bruce Barrera    DOB: 05-May-1951  Age: 65 y.o.  MR#: [id number]       PCP:  Josue Hector, MD      Insurance: Payor: MEDICAID [place] / Plan: MEDICAID [place] ACCESS / Product Type: *No Product type* /   CC:   No chief complaint on file.   VS Vitals:   04/04/16 1356  Pulse: 84  SpO2: 93%  Weight: 244 lb (110.7 kg)  Height: 5\' 11"  (1.803 m)    Weights Current Weight  04/04/16 244 lb (110.7 kg)  03/16/16 250 lb 14.4 oz (113.8 kg)  03/23/11 270 lb (122.5 kg)    Blood Pressure  BP Readings from Last 3 Encounters:  03/16/16 101/66  03/23/11 122/83  12/06/10 109/73     Admit date:  (Not on file) Last encounter with RMR:  Visit date not found   Allergy Review of patient's allergies indicates no known allergies.  Current Outpatient Prescriptions  Medication Sig Dispense Refill  . acetaminophen (TYLENOL) 650 MG CR tablet Take one by mouth twice daily     . aspirin 81 MG tablet Take 81 mg by mouth daily.      Marland Kitchen atorvastatin (LIPITOR) 80 MG tablet Take 1 tablet (80 mg total) by mouth daily. 30 tablet 1  . carvedilol (COREG) 12.5 MG tablet Take 1 tablet (12.5 mg total) by mouth 2 (two) times daily with a meal. 60 tablet 1  . clopidogrel (PLAVIX) 75 MG tablet Take 1 tablet (75 mg total) by mouth daily. 30 tablet 3  . Cyanocobalamin (VITAMIN B-12) 2500 MCG SUBL Take one by mouth twice daily      . levothyroxine (SYNTHROID) 100 MCG tablet Take 100 mcg by mouth daily before breakfast.     . metoprolol succinate (TOPROL-XL) 25 MG 24 hr tablet Take 25 mg by mouth daily.    . Omega-3 Fatty Acids (FISH OIL) 1200 MG CAPS Take one by mouth twice daily      . omeprazole (PRILOSEC) 20 MG capsule Take 20 mg by mouth daily.      . pravastatin (PRAVACHOL) 40 MG tablet Take 40 mg by mouth daily.    . predniSONE (DELTASONE) 50 MG tablet Take 1 tablet (50 mg total) by mouth daily with breakfast. 5 tablet 0  . sacubitril-valsartan (ENTRESTO) 24-26 MG Take 1 tablet by mouth 2 (two) times  daily. 60 tablet 2   Current Facility-Administered Medications  Medication Dose Route Frequency Provider Last Rate Last Dose  . 0.9 %  sodium chloride infusion  500 mL Intravenous Continuous Iva Boop, MD        Discontinued Meds:   There are no discontinued medications.  Patient Active Problem List   Diagnosis Date Noted  . Non-acute transmural anterior myocardial infarction within last eight weeks 03/15/2016  . Pain in the chest   . Chest pain 03/13/2016  . COPD exacerbation (HCC) 03/13/2016  . Exertional angina (HCC) 12/06/2010  . Coronary atherosclerosis of native coronary artery 12/06/2010  . Essential hypertension, benign 12/06/2010  . Mixed hyperlipidemia 12/06/2010  . Tobacco use disorder 12/06/2010  . Morbid obesity (HCC) 12/06/2010    LABS    Component Value Date/Time   NA 139 03/16/2016 0208   NA 139 03/15/2016 0247   NA 136 03/14/2016 0416   K 3.8 03/16/2016 0208   K 3.7 03/15/2016 0247   K 3.4 (L) 03/14/2016 0416   CL 105 03/16/2016 0208   CL 104 03/15/2016 0247   CL 102  03/14/2016 0416   CO2 30 03/16/2016 0208   CO2 28 03/15/2016 0247   CO2 25 03/14/2016 0416   GLUCOSE 123 (H) 03/16/2016 0208   GLUCOSE 164 (H) 03/15/2016 0247   GLUCOSE 207 (H) 03/14/2016 0416   BUN 18 03/16/2016 0208   BUN 13 03/15/2016 0247   BUN 15 03/14/2016 0416   CREATININE 0.76 03/16/2016 0208   CREATININE 0.69 03/15/2016 0247   CREATININE 0.73 03/14/2016 0416   CALCIUM 8.8 (L) 03/16/2016 0208   CALCIUM 9.0 03/15/2016 0247   CALCIUM 8.9 03/14/2016 0416   GFRNONAA >60 03/16/2016 0208   GFRNONAA >60 03/15/2016 0247   GFRNONAA >60 03/14/2016 0416   GFRAA >60 03/16/2016 0208   GFRAA >60 03/15/2016 0247   GFRAA >60 03/14/2016 0416   CMP     Component Value Date/Time   NA 139 03/16/2016 0208   K 3.8 03/16/2016 0208   CL 105 03/16/2016 0208   CO2 30 03/16/2016 0208   GLUCOSE 123 (H) 03/16/2016 0208   BUN 18 03/16/2016 0208   CREATININE 0.76 03/16/2016 0208    CALCIUM 8.8 (L) 03/16/2016 0208   PROT 5.9 (L) 03/16/2016 0208   ALBUMIN 2.9 (L) 03/16/2016 0208   AST 15 03/16/2016 0208   ALT 23 03/16/2016 0208   ALKPHOS 73 03/16/2016 0208   BILITOT 0.3 03/16/2016 0208   GFRNONAA >60 03/16/2016 0208   GFRAA >60 03/16/2016 0208       Component Value Date/Time   WBC 13.2 (H) 03/16/2016 0208   WBC 20.9 (H) 03/15/2016 0247   WBC 21.6 (H) 03/14/2016 0416   HGB 15.5 03/16/2016 0208   HGB 16.1 03/15/2016 0247   HGB 15.6 03/14/2016 0416   HCT 46.8 03/16/2016 0208   HCT 48.1 03/15/2016 0247   HCT 46.7 03/14/2016 0416   MCV 98.9 03/16/2016 0208   MCV 97.8 03/15/2016 0247   MCV 98.5 03/14/2016 0416    Lipid Panel  No results found for: CHOL, TRIG, HDL, CHOLHDL, VLDL, LDLCALC, LDLDIRECT  ABG    Component Value Date/Time   PHART 7.353 03/15/2016 1154   PCO2ART 50.4 (H) 03/15/2016 1154   PO2ART 76.0 (L) 03/15/2016 1154   HCO3 28.0 (H) 03/15/2016 1154   TCO2 29 03/15/2016 1154   O2SAT 94.0 03/15/2016 1154     Lab Results  Component Value Date   TSH 0.695 03/15/2016   BNP (last 3 results)  Recent Labs  03/12/16 2310  BNP 361.0*    ProBNP (last 3 results) No results for input(s): PROBNP in the last 8760 hours.  Cardiac Panel (last 3 results) No results for input(s): CKTOTAL, CKMB, TROPONINI, RELINDX in the last 72 hours.  Iron/TIBC/Ferritin/ %Sat No results found for: IRON, TIBC, FERRITIN, IRONPCTSAT   EKG Orders placed or performed during the hospital encounter of 03/12/16  . EKG 12-Lead  . EKG 12-Lead  . ED EKG  . ED EKG  . EKG 12-Lead  . EKG 12-Lead  . EKG 12-Lead  . EKG 12-Lead  . EKG     Prior Assessment and Plan Problem List as of 04/04/2016 Reviewed: 03/15/2016  7:49 AM by Peter Swaziland, MD     Cardiovascular and Mediastinum   Exertional angina [facility])   Last Assessment & Plan 12/06/2010 Office Visit Written 12/06/2010  4:44 PM by Jonelle Sidle, MD    Noted over the last 6 months, and with reported history of CAD  and previous "heart attack." No other details are available. Given his active risk factor profile,  symptoms, and reported history, we will proceed with further objective testing to include a 2-D echocardiogram as well as a 2-day Lexiscan Cardiolite. Office followup arranged.      Coronary atherosclerosis of native coronary artery   Last Assessment & Plan 12/06/2010 Office Visit Written 12/06/2010  4:42 PM by Jonelle Sidle, MD    Diagnosis reported by patient, no specific records to support this as yet. He clearly has active risk factors, and likely does have some degree of coronary atherosclerosis.      Essential hypertension, benign   Last Assessment & Plan 12/06/2010 Office Visit Written 12/06/2010  4:42 PM by Jonelle Sidle, MD    Blood pressure is well controlled today.      Non-acute transmural anterior myocardial infarction within last eight weeks     Respiratory   COPD exacerbation (HCC)     Other   Mixed hyperlipidemia   Last Assessment & Plan 12/06/2010 Office Visit Written 12/06/2010  4:41 PM by Jonelle Sidle, MD    Continues on statin therapy, with followup lipids to be obtained by Dr. Lysbeth Galas. Generally would aim for LDL control around 70 given reported diagnosis of coronary artery disease.      Tobacco use disorder   Last Assessment & Plan 12/06/2010 Office Visit Written 12/06/2010  4:41 PM by Jonelle Sidle, MD    Smoking cessation was discussed. He would likely be a good candidate for nicotine replacement therapy, perhaps patches with PRN gum, and could consider use of the quit line.      Morbid obesity [facility])   Last Assessment & Plan 12/06/2010 Office Visit Written 12/06/2010  4:40 PM by Jonelle Sidle, MD    We discussed basic exercise and weight loss strategies.      Chest pain   Pain in the chest       Imaging: Dg Chest 2 View  Result Date: 03/13/2016 CLINICAL DATA:  Chest pain for 5 hours. Shortness of breath, nausea and diaphoresis. EXAM: CHEST  2  VIEW COMPARISON:  Chest radiograph 09/30/2013 FINDINGS: Heart size and mediastinal contours are unchanged. Bronchitic markings appear chronic. No focal airspace disease, pulmonary edema, pneumothorax or pleural effusion. There is degenerative change in the lower thoracic and upper lumbar spine. IMPRESSION: Chronic bronchitic change.  No acute process. Electronically Signed   By: Rubye Oaks M.D.   On: 03/13/2016 00:23   Ct Angio Chest Pe W/cm &/or Wo Cm  Result Date: 03/13/2016 CLINICAL DATA:  Chest pain and shortness of breath since last night. Elevated D-dimer. EXAM: CT ANGIOGRAPHY CHEST WITH CONTRAST TECHNIQUE: Multidetector CT imaging of the chest was performed using the standard protocol during bolus administration of intravenous contrast. Multiplanar CT image reconstructions and MIPs were obtained to evaluate the vascular anatomy. CONTRAST:  100 cc Isovue 370 IV COMPARISON:  Radiographs 2 hours prior. Radiograph 09/30/2013 also reviewed. FINDINGS: Cardiovascular: There are no filling defects within the pulmonary arteries to suggest pulmonary embolus. Minimal atherosclerosis of the aortic arch. No aortic dissection or aneurysm. Mild distal aortic tortuosity. There are coronary artery calcifications. Mediastinum/Nodes: Prominent right upper paratracheal node measures 9 mm short axis, borderline. No additional mediastinal or hilar adenopathy. No pericardial fluid. Lungs/Pleura: Central bronchial thickening most prominent in the lower lobes. Minimal dependent atelectasis. Breathing motion artifact obscures dependent left lower lobe evaluation. No consolidation to suggest pneumonia. No pulmonary mass. No suspicious nodule allowing for breathing motion. Upper Abdomen: No acute abnormality. Musculoskeletal: Multilevel degenerative change throughout the spine. There are  no acute or suspicious osseous abnormalities. Review of the MIP images confirms the above findings. IMPRESSION: 1. No pulmonary embolus. 2.  Bronchial thickening, progressed from prior radiograph. 3. Mild aortic atherosclerosis.  Coronary artery calcifications. Electronically Signed   By: Rubye Oaks M.D.   On: 03/13/2016 01:52   US Venous Img Lower Bilateral  Result Date: 03/13/2016 CLINICAL DATA:  65 year old male with a history of lower extremity edema EXAM: BILATERAL LOWER EXTREMITY VENOUS DOPPLER ULTRASOUND TECHNIQUE: Gray-scale sonography with graded compression, as well as color Doppler and duplex ultrasound were performed to evaluate the lower extremity deep venous systems from the level of the common femoral vein and including the common femoral, femoral, profunda femoral, popliteal and calf veins including the posterior tibial, peroneal and gastrocnemius veins when visible. The superficial great saphenous vein was also interrogated. Spectral Doppler was utilized to evaluate flow at rest and with distal augmentation maneuvers in the common femoral, femoral and popliteal veins. COMPARISON:  None. FINDINGS: RIGHT LOWER EXTREMITY Common Femoral Vein: No evidence of thrombus. Normal compressibility, respiratory phasicity and response to augmentation. Saphenofemoral Junction: No evidence of thrombus. Normal compressibility and flow on color Doppler imaging. Profunda Femoral Vein: No evidence of thrombus. Normal compressibility and flow on color Doppler imaging. Femoral Vein: No evidence of thrombus. Normal compressibility, respiratory phasicity and response to augmentation. Popliteal Vein: No evidence of thrombus. Normal compressibility, respiratory phasicity and response to augmentation. Calf Veins: No thrombus of the posterior tibial vein. Peroneal vein not visualized completely. Superficial Great Saphenous Vein: No evidence of thrombus. Normal compressibility and flow on color Doppler imaging. Other Findings:  Edema of the lower extremity. LEFT LOWER EXTREMITY Common Femoral Vein: No evidence of thrombus. Normal compressibility,  respiratory phasicity and response to augmentation. Saphenofemoral Junction: No evidence of thrombus. Normal compressibility and flow on color Doppler imaging. Profunda Femoral Vein: No evidence of thrombus. Normal compressibility and flow on color Doppler imaging. Femoral Vein: No evidence of thrombus. Normal compressibility, respiratory phasicity and response to augmentation. Popliteal Vein: No evidence of thrombus. Normal compressibility, respiratory phasicity and response to augmentation. Calf Veins: No evidence of thrombus. Normal compressibility and flow on color Doppler imaging. Superficial Great Saphenous Vein: No evidence of thrombus. Normal compressibility and flow on color Doppler imaging. Other Findings:  Edema of lower extremity. IMPRESSION: Sonographic survey bilateral lower extremities negative for DVT. Bilateral lower extremity edema. Signed, Yvone Neu. Loreta Ave, DO [facility] Electronically Signed   By: Gilmer Mor D.O.   On: 03/13/2016 12:55

## 2016-04-04 NOTE — Patient Instructions (Signed)
Medication Instructions:  STOP METOPROLOL   Labwork: NONE  Testing/Procedures: NONE  Follow-Up: Your physician recommends that you schedule a follow-up appointment in: 2 WEEKS    Any Other Special Instructions Will Be Listed Below (If Applicable).     If you need a refill on your cardiac medications before your next appointment, please call your pharmacy.

## 2016-04-04 NOTE — Progress Notes (Signed)
Cardiology Office Note   Date:  04/04/2016   ID:  Bruce Barrera, DOB 1950-09-22, MRN [id number]  PCP:  Josue Hector, MD  Cardiologist: [place]/  Joni Reining, NP   Chief Complaint  Patient presents with  . Coronary Artery Disease  . Congestive Heart Failure      History of Present Illness: Bruce Barrera is a 65 y.o. male who presents for ongoing assessment and management of coronary artery disease with recent admission to [facility] on 03/15/2016 with subacute anterior MI. Other history includes acute systolic heart failure with ischemic cardiomyopathy, EF of 30-35%. Hypertension, hypercholesterolemia, and ongoing tobacco abuse.  The patient underwent cardiac catheterization by Dr. Kirke Corin dated 03/15/2016. The patient was found to have proximal LAD lesion 100% stenosed, ostial RCA lesion 20% stenosis, there was moderate to severe left ventricular systolic dysfunction. The occluded proximal LAD was more than 72 hours of duration, with lack of current anginal symptoms, there was no indication for hospitalization. The patient was continued on medical management. LAD PCI can be considered in the future if refractory anginal symptoms, and there is anterior viability.  The patient was discharged on carvedilol 12.5 mg twice a day, Plavix 75 mg daily,Entresto 24/26 mg twice a day, Lipitor 80 mg daily, aspirin 81 mg daily. And also lists that the patient was sent home on metoprolol which may be an error in his chart.. The patient is here for post hospitalization followup.  He comes today completely exhausted. He states he has no energy he is lightheaded and dizzy when he stands. He is wearing a life vest. He reportedly continues to smoke, but has gone from 2 packs a day to one pack a day and is trying very diligently to quit. He denies recurrent chest pain.  Echocardiogram 03/13/2016 Left ventricle: The cavity size was normal. Wall thickness was   increased increased in a pattern of  mild to moderate LVH.   Systolic function was moderately to severely reduced. The   estimated ejection fraction was in the range of 30% to 35%.   Doppler parameters are consistent with abnormal left ventricular   relaxation (grade 1 diastolic dysfunction). - Regional wall motion abnormality: Akinesis of the apical   anterior, mid anteroseptal, apical inferior, apical lateral, and   apical myocardium; hypokinesis of the mid anterior and basal   anteroseptal myocardium. - Aortic valve: Mildly calcified annulus. Trileaflet; mildly   thickened leaflets. Valve area (VTI): 2.58 cm^2. Valve area   (Vmax): 2.86 cm^2. - Left atrium: The atrium was mildly dilated. - Atrial septum: No defect or patent foramen ovale was identified. - Technically difficult study. Echocontrast was used to enhance   visualization. Past Medical History:  Diagnosis Date  . Arthritis    Hips and knees  . Back pain   . Coronary artery disease    Details unclear  . Essential hypertension, benign   . GERD (gastroesophageal reflux disease)   . Hemorrhoids   . Hyperlipidemia   . MI (myocardial infarction) (HCC)    States "heart attack" 2/95 in prison in [place]  . Obesity   . Peptic ulcer    GIB 1994  . Stroke [facility])    States "stroke" with left sided weakness 5/95 in prison in [place]  . Thyroid disease     Past Surgical History:  Procedure Laterality Date  . CARDIAC CATHETERIZATION N/A 03/15/2016   Procedure: Right/Left Heart Cath and Coronary Angiography;  Surgeon: Iran Ouch, MD;  [facility];  Service:  Cardiovascular;  Laterality: N/A;  . COLONOSCOPY  03/23/2011   hemorrhoids  . HIP ARTHROSCOPY    . KNEE ARTHROSCOPY     Right knee     Current Outpatient Prescriptions  Medication Sig Dispense Refill  . acetaminophen (TYLENOL) 650 MG CR tablet Take one by mouth twice daily     . aspirin 81 MG tablet Take 81 mg by mouth daily.      Marland Kitchen atorvastatin (LIPITOR) 80 MG tablet Take 1 tablet (80 mg  total) by mouth daily. 30 tablet 1  . carvedilol (COREG) 12.5 MG tablet Take 1 tablet (12.5 mg total) by mouth 2 (two) times daily with a meal. 60 tablet 1  . clopidogrel (PLAVIX) 75 MG tablet Take 1 tablet (75 mg total) by mouth daily. 30 tablet 3  . Cyanocobalamin (VITAMIN B-12) 2500 MCG SUBL Take one by mouth twice daily      . levothyroxine (SYNTHROID) 100 MCG tablet Take 100 mcg by mouth daily before breakfast.     . Omega-3 Fatty Acids (FISH OIL) 1200 MG CAPS Take one by mouth twice daily      . omeprazole (PRILOSEC) 20 MG capsule Take 20 mg by mouth daily.      . pravastatin (PRAVACHOL) 40 MG tablet Take 40 mg by mouth daily.    . predniSONE (DELTASONE) 50 MG tablet Take 1 tablet (50 mg total) by mouth daily with breakfast. 5 tablet 0  . sacubitril-valsartan (ENTRESTO) 24-26 MG Take 1 tablet by mouth 2 (two) times daily. 60 tablet 2   Current Facility-Administered Medications  Medication Dose Route Frequency Provider Last Rate Last Dose  . 0.9 %  sodium chloride infusion  500 mL Intravenous Continuous Iva Boop, MD        Allergies:   Review of patient's allergies indicates no known allergies.    Social History:  The patient  reports that he has been smoking Cigarettes.  He has been smoking about 1.00 pack per day. He has never used smokeless tobacco. He reports that he does not drink alcohol or use drugs.   Family History:  The patient's family history includes Aneurysm in his brother; Cirrhosis in his brother; Diabetes in his mother; Heart attack in his brother; Heart disease in his mother; Hypertension in his mother; Stroke in his father and mother.    ROS: All other systems are reviewed and negative. Unless otherwise mentioned in H&P    PHYSICAL EXAM: VS:  BP (!) 100/54   Pulse 84   Ht 5\' 11"  (1.803 m)   Wt 244 lb (110.7 kg)   SpO2 93%   BMI 34.03 kg/m  , BMI Body mass index is 34.03 kg/m. GEN: Well nourished, well developed, in no acute distress  HEENT: normal   Neck: no JVD, carotid bruits, or masses Cardiac: RRR; no murmurs, rubs, or gallops,no edema  Respiratory:  clear to auscultation bilaterally, normal work of breathing GI: soft, nontender, nondistended, + BS MS: no deformity or atrophy  Skin: warm and dry, no rash Neuro:  Strength and sensation are intact Psych: euthymic mood, full affect   Recent Labs: 03/12/2016: B Natriuretic Peptide 361.0 03/15/2016: TSH 0.695 03/16/2016: ALT 23; BUN 18; Creatinine, Ser 0.76; Hemoglobin 15.5; Platelets 137; Potassium 3.8; Sodium 139    Lipid Panel No results found for: CHOL, TRIG, HDL, CHOLHDL, VLDL, LDLCALC, LDLDIRECT    Wt Readings from Last 3 Encounters:  04/04/16 244 lb (110.7 kg)  03/16/16 250 lb 14.4 oz (113.8 kg)  03/23/11  270 lb (122.5 kg)      ASSESSMENT AND PLAN:  1.  Coronary artery disease: Proximal LAD lesion 100% stenosed, ostial RCA lesion 20% stenosis, there was moderate to severe left ventricular systolic dysfunction. EF was 30-35%. He is wearing a life vest.  I have reviewed his medications and he is indeed taking metoprolol and carvedilol. I will discontinue metoprolol. He'll not take carvedilol this evening. In the morning he will start carvedilol 12.5 mg twice a day as directed. We will see him again in 2 weeks to evaluate his status off of multiple beta blockers. In the air and he will continue statin Plavix and aspirin.  2. Systolic dysfunction:the patient does not appear to be fluid overloaded at this time. He is on Entresto 24/26 mg twice a day. He is not on a diuretic currently. His weight is improved since hospitalization from 270 pounds to 244 pounds. He'll need close followup in 2 weeks for evaluation of his status. May consider referral to advanced heart failure clinic.  3. Hypertension:he is hypotensive today which I believe is related to overmedication with beta blocker, possibly from  [place]. We'll see him in a couple weeks for close followup with medication  changes as discussed.He is symptomatic with profound fatigue and dizziness.  4. Ongoing tobacco abuse:he has cut down from 2 packs a day to one pack a day and its continuing to wean himself from nicotine. He is encouraged in this endeavor.   Current medicines are reviewed at length with the patient today.    Labs/ tests ordered today include:  No orders of the defined types were placed in this encounter.    Disposition:   FU with 2 weeks  Signed, Joni Reining, NP  04/04/2016 4:22 PM    [facility] [address], [place], [place] [address] Phone: [phone]; Fax: [phone]

## 2016-04-06 ENCOUNTER — Telehealth: Payer: Self-pay | Admitting: Adult Health

## 2016-04-06 NOTE — Telephone Encounter (Signed)
Patient in office to discuss medication changes.

## 2016-04-17 ENCOUNTER — Encounter: Payer: Self-pay | Admitting: Physician Assistant

## 2016-04-17 NOTE — Progress Notes (Signed)
Cardiology Office Note    Date:  04/18/2016  ID:  Bruce Barrera, DOB [birth year], MRN [id number] PCP:  Josue Hector, MD  Cardiologist:  Diona Browner   Chief Complaint: f/u hypotension  History of Present Illness:  Bruce Barrera is a 65 y.o. male with history of recently diagnosed CAD/chronic systolic CHF (03/2016 subacute anterior MI -> 100% prox LAD >72 hours out thus treated medically, LVEF 30-35%), HTN, HLD, tobacco abuse, remote stroke who presents for f/u. As above he was admitted early July with late presenting MI. LHC 03/15/16 occluded prox LAD, ostial RCA spasm improving wtih NTG, otherwise nonobstructive disease, faint R-L and L-L collaterals to the LAD, EF 35%. Given that occluded proximal LAD was more than 72 hours of duration and he had no further anginal symptoms, there was no indication for revascularization. The patient was continued on medical management with recommendation to consider LAD PCI if refractory anginal symptom and if there is anterior viability. 2D echo 03/13/16: LVEF 30-35%, grade 1 DD, multiple WMA. Last labs 7/6 showed Cr 0.76, WBC 13k. He was started on ASA, BB, Plavix, Entresto and Lipitor, and discharged with LifeVest. He was hypotensive in f/u 04/04/16. Metoprolol was still on his med list from discharge along with Coreg. Metoprolol was discontinued and he was asked to f/u closely here.  He presents back to clinic "actually feeling pretty good." His GF has been checking his BP at home - he says day by day it's gradually starting to get a little higher at a time. He does not feel as fatigued as before. He denies any recurrent angina. No SOB. Does have occasional cough/congestion but no symptoms like what prompted him to seek care for MI. He is not wearing the LifeVest today because he had to wash the vest insert today. He denies any syncope.   Past Medical History:  Diagnosis Date  . Arthritis    Hips and knees  . Back pain   . Chronic systolic CHF (congestive  heart failure) (HCC)   . Coronary artery disease    a. 03/2016 subacute anterior MI -> 100% prox LAD >72 hours out thus treated medically, LVEF 30-35%. Consider LAD PCI if refractory angina + if ant viability.  . Essential hypertension   . GERD (gastroesophageal reflux disease)   . Hemorrhoids   . Hyperlipidemia   . Hypotension   . Ischemic cardiomyopathy   . MI (myocardial infarction) (HCC)    States "heart attack" 2/95 in prison in [place]  . Obesity   . Peptic ulcer    GIB 1994  . Stroke [facility])    States "stroke" with left sided weakness 5/95 in prison in [place]  . Thyroid disease   . Tobacco abuse     Past Surgical History:  Procedure Laterality Date  . CARDIAC CATHETERIZATION N/A 03/15/2016   Procedure: Right/Left Heart Cath and Coronary Angiography;  Surgeon: Iran Ouch, MD;  Location: [facility] INV[facility] LAB;  Service: Cardiovascular;  Laterality: N/A;  . COLONOSCOPY  03/23/2011   hemorrhoids  . HIP ARTHROSCOPY    . KNEE ARTHROSCOPY     Right knee    Current Medications: Outpatient Medications Prior to Visit  Medication Sig Dispense Refill  . acetaminophen (TYLENOL) 650 MG CR tablet Take one by mouth twice daily     . aspirin 81 MG tablet Take 81 mg by mouth daily.      Marland Kitchen atorvastatin (LIPITOR) 80 MG tablet Take 1 tablet (80 mg total) by mouth daily. 30 tablet  1  . carvedilol (COREG) 12.5 MG tablet Take 1 tablet (12.5 mg total) by mouth 2 (two) times daily with a meal. 60 tablet 1  . clopidogrel (PLAVIX) 75 MG tablet Take 1 tablet (75 mg total) by mouth daily. 30 tablet 3  . Cyanocobalamin (VITAMIN B-12) 2500 MCG SUBL Take one by mouth twice daily      . levothyroxine (SYNTHROID) 100 MCG tablet Take 100 mcg by mouth daily before breakfast.     . Omega-3 Fatty Acids (FISH OIL) 1200 MG CAPS Take one by mouth twice daily      . omeprazole (PRILOSEC) 20 MG capsule Take 20 mg by mouth daily.      . pravastatin (PRAVACHOL) 40 MG tablet Take 40 mg by mouth daily.    . predniSONE  (DELTASONE) 50 MG tablet Take 1 tablet (50 mg total) by mouth daily with breakfast. 5 tablet 0  . sacubitril-valsartan (ENTRESTO) 24-26 MG Take 1 tablet by mouth 2 (two) times daily. 60 tablet 2   Allergies:   Review of patient's allergies indicates no known allergies.   Social History   Social History  . Marital status: Divorced    Spouse name: N/A  . Number of children: N/A  . Years of education: N/A   Occupational History  . Disabled     [employer]   Social History Main Topics  . Smoking status: Current Every Day Smoker    Packs/day: 1.00    Types: Cigarettes  . Smokeless tobacco: Never Used  . Alcohol use No  . Drug use: No  . Sexual activity: Not Currently   Other Topics Concern  . None   Social History Narrative   Released from prison last August     Family History:  The patient's family history includes Aneurysm in his brother; Cirrhosis in his brother; Diabetes in his mother; Heart attack in his brother; Heart disease in his mother; Hypertension in his mother; Stroke in his father and mother.   ROS:   Please see the history of present illness. All other systems are reviewed and otherwise negative.    PHYSICAL EXAM:   VS:  BP (!) 96/58   Pulse 76   Ht 5\' 11"  (1.803 m)   Wt 242 lb (109.8 kg)   SpO2 92%   BMI 33.75 kg/m   BMI: Body mass index is 33.75 kg/m. GEN: Well nourished, well developed obese WM, in no acute distress  HEENT: normocephalic, atraumatic Neck: no JVD, carotid bruits, or masses Cardiac: RRR; no murmurs, rubs, or gallops, no edema  Respiratory:  clear to auscultation bilaterally, normal work of breathing GI: soft, nontender, nondistended, + BS MS: no deformity or atrophy  Skin: warm and dry, no rash Neuro:  Alert and Oriented x 3, Strength and sensation are intact, follows commands Psych: euthymic mood, full affect  Wt Readings from Last 3 Encounters:  04/18/16 242 lb (109.8 kg)  04/04/16 244 lb (110.7 kg)  03/16/16 250 lb 14.4 oz  (113.8 kg)      Studies/Labs Reviewed:   EKG:  EKG was not ordered today.  Recent Labs: 03/12/2016: B Natriuretic Peptide 361.0 03/15/2016: TSH 0.695 03/16/2016: ALT 23; BUN 18; Creatinine, Ser 0.76; Hemoglobin 15.5; Platelets 137; Potassium 3.8; Sodium 139   Lipid Panel No results found for: CHOL, TRIG, HDL, CHOLHDL, VLDL, LDLCALC, LDLDIRECT  Additional studies/ records that were reviewed today include: Summarized above.    ASSESSMENT & PLAN:   1. Symptomatic hypotension with prior h/o HTN  -  BP remains on the softer side but he is actually tolerating this much better off the duplicate beta blocker. He says day by day his home BPs are gradually increasing. Given that he is feeling better, will continue current regimen. He will continue to follow BP and call if he begins seeing his numbers trend downward or if he becomes symptomatic again. 2. Chronic systolic CHF - need to update labwork given Entresto. Not volume overloaded on exam. Since he did not have revascularization, will order f/u echo to be done >40 days from the day of his original MI. If his EF is still low, will need EP referral to consider ICD. If EF has improved, we can discontinue LifeVest. Compliance with LifeVest encouraged. He reassures me he is wearing it all the time with the exception of this afternoon. Reviewed low sodium diet, fluid restriction. 3. CAD with recent STEMI as above - continue medical therapy with aspirin, Plavix, BB, statin. He actually has 2 statins listed on his chart - will d/c pravastatin.  F/u LFTs given statin use. Further f/u of lipids will be at the discretion of his primary cardiologist. Will also f/u CBC to reassess persistently elevated WBC - if still up will need to f/u PCP for further eval. Since he is on Plavix he should not be on omeprazole. Will switch omeprazole to Protonix. 4. Tobacco abuse - counseled regarding cessation. He went from 2.5 ppd to <1 ppd. Encouraged continued efforts to quit  completely.  Disposition: F/u with Dr. Diona Browner in 4-6 weeks.   Medication Adjustments/Labs and Tests Ordered: Current medicines are reviewed at length with the patient today.  Concerns regarding medicines are outlined above. Medication changes, Labs and Tests ordered today are summarized above and listed in the Patient Instructions accessible in Encounters.   Thomasene Mohair PA-C  04/18/2016 1:09 PM    [facility] - [place] Location in [facility] [address] [place], [place] [address] Ph: [phone]; Fax [phone]

## 2016-04-18 ENCOUNTER — Ambulatory Visit (INDEPENDENT_AMBULATORY_CARE_PROVIDER_SITE_OTHER): Payer: Medicaid Other | Admitting: Physician Assistant

## 2016-04-18 ENCOUNTER — Encounter: Payer: Self-pay | Admitting: Physician Assistant

## 2016-04-18 ENCOUNTER — Other Ambulatory Visit (HOSPITAL_COMMUNITY)
Admission: RE | Admit: 2016-04-18 | Discharge: 2016-04-18 | Disposition: A | Discharge status: Home / Self Care | Payer: Medicaid Other | Source: Ambulatory Visit | Attending: Physician Assistant | Admitting: Physician Assistant

## 2016-04-18 VITALS — BP 96/58 | HR 76 | Ht 71.0 in | Wt 242.0 lb

## 2016-04-18 DIAGNOSIS — I10 Essential (primary) hypertension: Secondary | ICD-10-CM | POA: Insufficient documentation

## 2016-04-18 DIAGNOSIS — I5022 Chronic systolic (congestive) heart failure: Secondary | ICD-10-CM

## 2016-04-18 DIAGNOSIS — I9589 Other hypotension: Secondary | ICD-10-CM

## 2016-04-18 DIAGNOSIS — I2510 Atherosclerotic heart disease of native coronary artery without angina pectoris: Secondary | ICD-10-CM | POA: Insufficient documentation

## 2016-04-18 DIAGNOSIS — Z720 Tobacco use: Secondary | ICD-10-CM

## 2016-04-18 DIAGNOSIS — I959 Hypotension, unspecified: Secondary | ICD-10-CM

## 2016-04-18 LAB — CBC WITH DIFFERENTIAL/PLATELET
Basophils Absolute: 0 10*3/uL (ref 0.0–0.1)
Basophils Relative: 0 %
Eosinophils Absolute: 0.1 10*3/uL (ref 0.0–0.7)
Eosinophils Relative: 2 %
HCT: 46.8 % (ref 39.0–52.0)
Hemoglobin: 15.9 g/dL (ref 13.0–17.0)
Lymphocytes Relative: 22 %
Lymphs Abs: 1.4 10*3/uL (ref 0.7–4.0)
MCH: 32.9 pg (ref 26.0–34.0)
MCHC: 34 g/dL (ref 30.0–36.0)
MCV: 96.9 fL (ref 78.0–100.0)
Monocytes Absolute: 0.5 10*3/uL (ref 0.1–1.0)
Monocytes Relative: 8 %
Neutro Abs: 4.5 10*3/uL (ref 1.7–7.7)
Neutrophils Relative %: 68 %
Platelets: 144 10*3/uL — ABNORMAL LOW (ref 150–400)
RBC: 4.83 MIL/uL (ref 4.22–5.81)
RDW: 14.7 % (ref 11.5–15.5)
WBC: 6.5 10*3/uL (ref 4.0–10.5)

## 2016-04-18 LAB — COMPREHENSIVE METABOLIC PANEL
ALT: 21 U/L (ref 17–63)
AST: 21 U/L (ref 15–41)
Albumin: 3.6 g/dL (ref 3.5–5.0)
Alkaline Phosphatase: 94 U/L (ref 38–126)
Anion gap: 6 (ref 5–15)
BUN: 9 mg/dL (ref 6–20)
CO2: 26 mmol/L (ref 22–32)
Calcium: 8.6 mg/dL — ABNORMAL LOW (ref 8.9–10.3)
Chloride: 105 mmol/L (ref 101–111)
Creatinine, Ser: 0.79 mg/dL (ref 0.61–1.24)
GFR calc Af Amer: >60 mL/min (ref >60)
GFR calc non Af Amer: >60 mL/min (ref >60)
Glucose, Bld: 117 mg/dL — ABNORMAL HIGH (ref 65–99)
Potassium: 3.9 mmol/L (ref 3.5–5.1)
Sodium: 137 mmol/L (ref 135–145)
Total Bilirubin: 0.6 mg/dL (ref 0.3–1.2)
Total Protein: 6.6 g/dL (ref 6.5–8.1)

## 2016-04-18 MED ORDER — PANTOPRAZOLE SODIUM 40 MG PO TBEC: 40.0000 mg | DELAYED_RELEASE_TABLET | Freq: Every day | ORAL | 1 refills | Status: DC

## 2016-04-18 NOTE — Patient Instructions (Signed)
Your physician recommends that you schedule a follow-up appointment in: 4-6 Weeks with Dr. [name].   Your physician has recommended you make the following change in your medication:  Stop taking Pravastatin Stop taking Prilosec   Your physician recommends that you have  lab work done today.  Your physician has requested that you have an echocardiogram. Echocardiography is a painless test that uses sound waves to create images of your heart. It provides your doctor with information about the size and shape of your heart and how well your heart's chambers and valves are working. This procedure takes approximately one hour. There are no restrictions for this procedure.   If you need a refill on your cardiac medications before your next appointment, please call your pharmacy.  Thank you for choosing [facility]!

## 2016-05-01 ENCOUNTER — Ambulatory Visit (HOSPITAL_COMMUNITY)
Admission: RE | Admit: 2016-05-01 | Discharge: 2016-05-01 | Disposition: A | Discharge status: Home / Self Care | Payer: Medicaid Other | Source: Ambulatory Visit | Attending: Physician Assistant | Admitting: Physician Assistant

## 2016-05-01 DIAGNOSIS — R29898 Other symptoms and signs involving the musculoskeletal system: Secondary | ICD-10-CM | POA: Diagnosis not present

## 2016-05-01 DIAGNOSIS — I252 Old myocardial infarction: Secondary | ICD-10-CM | POA: Diagnosis not present

## 2016-05-01 DIAGNOSIS — I110 Hypertensive heart disease with heart failure: Secondary | ICD-10-CM | POA: Insufficient documentation

## 2016-05-01 DIAGNOSIS — I2510 Atherosclerotic heart disease of native coronary artery without angina pectoris: Secondary | ICD-10-CM

## 2016-05-01 DIAGNOSIS — J449 Chronic obstructive pulmonary disease, unspecified: Secondary | ICD-10-CM | POA: Insufficient documentation

## 2016-05-01 DIAGNOSIS — Z720 Tobacco use: Secondary | ICD-10-CM | POA: Insufficient documentation

## 2016-05-01 DIAGNOSIS — E785 Hyperlipidemia, unspecified: Secondary | ICD-10-CM | POA: Diagnosis not present

## 2016-05-01 DIAGNOSIS — I9589 Other hypotension: Secondary | ICD-10-CM

## 2016-05-01 DIAGNOSIS — I358 Other nonrheumatic aortic valve disorders: Secondary | ICD-10-CM | POA: Insufficient documentation

## 2016-05-01 DIAGNOSIS — I5022 Chronic systolic (congestive) heart failure: Secondary | ICD-10-CM | POA: Diagnosis not present

## 2016-05-01 DIAGNOSIS — I10 Essential (primary) hypertension: Secondary | ICD-10-CM

## 2016-05-01 DIAGNOSIS — I959 Hypotension, unspecified: Secondary | ICD-10-CM

## 2016-05-01 NOTE — Progress Notes (Signed)
*  PRELIMINARY RESULTS* Echocardiogram 2D Echocardiogram has been performed.  Stacey DrainWhite, Akilah Cureton J 05/01/2016, 10:19 AM

## 2016-05-18 ENCOUNTER — Ambulatory Visit (INDEPENDENT_AMBULATORY_CARE_PROVIDER_SITE_OTHER): Payer: Medicaid Other | Admitting: Cardiology

## 2016-05-18 ENCOUNTER — Encounter: Payer: Self-pay | Admitting: Cardiology

## 2016-05-18 VITALS — BP 82/74 | HR 78 | Ht 71.0 in | Wt 240.0 lb

## 2016-05-18 DIAGNOSIS — K219 Gastro-esophageal reflux disease without esophagitis: Secondary | ICD-10-CM

## 2016-05-18 DIAGNOSIS — I2510 Atherosclerotic heart disease of native coronary artery without angina pectoris: Secondary | ICD-10-CM | POA: Diagnosis not present

## 2016-05-18 DIAGNOSIS — I255 Ischemic cardiomyopathy: Secondary | ICD-10-CM | POA: Diagnosis not present

## 2016-05-18 DIAGNOSIS — E785 Hyperlipidemia, unspecified: Secondary | ICD-10-CM

## 2016-05-18 MED ORDER — CARVEDILOL 3.125 MG PO TABS: 3.1250 mg | ORAL_TABLET | Freq: Two times a day (BID) | ORAL | 3 refills | Status: DC

## 2016-05-18 NOTE — Patient Instructions (Signed)
Your physician recommends that you schedule a follow-up appointment in:  3 months Dr [name]     DECREASE Coreg to 3.125 mg twice a day     Thank you for choosing [facility] !

## 2016-05-18 NOTE — Progress Notes (Signed)
Cardiology Office Note  Date: 05/18/2016   ID: Bruce Barrera, DOB 1951-04-01, MRN [id number]  PCP: Josue HectorNYLAND,LEONARD ROBERT, MD  Primary Cardiologist: Nona DellSamuel Ilyse Tremain, MD   Chief Complaint  Patient presents with  . Coronary Artery Disease  . Cardiomyopathy    History of Present Illness: Bruce Barrera is a medically complex 65 y.o. male that I have not seen in the office since 2012. He has been recently evaluated by Ms. Lawrence NP and Ms. Dunn PA-C. I reviewed his records. He is status post subacute anterior wall infarct in July with findings of occluded LAD that was ultimately managed medically due to late presentation and presence of collaterals. He has an ischemic cardiomyopathy with LVEF 30-35% and wore a Lifevest ICD. When seen most recently by Ms. Dunn PA-C he was symptomatically stable and medical therapy was continued with follow-up echocardiogram obtained. Fortunately, LVEF improved to the range of 40-45%.  He presents today for a follow-up visit. Does not report angina, but states his blood pressure has been quite low at home when he checks it and he has had episodes of fatigue and lightheadedness.  I went over his medications. He has been taking Entresto, but ran out of Coreg 12.50 g twice daily through 4 days ago. I rechecked his blood pressure today in the right arm finding it to be 94/60. He is asymptomatic at this time.   Past Medical History:  Diagnosis Date  . Arthritis    Hips and knees  . Back pain   . Chronic systolic CHF (congestive heart failure) (HCC)   . Coronary artery disease    a. 03/2016 subacute anterior MI -> 100% prox LAD >72 hours out thus treated medically, LVEF 30-35%. Consider LAD PCI if refractory angina + if ant viability.  . Essential hypertension   . GERD (gastroesophageal reflux disease)   . Hemorrhoids   . History of stroke    States "stroke" with left sided weakness 5[place]  . Hyperlipidemia   . Hypotension   . Hypothyroidism    . Ischemic cardiomyopathy   . MI (myocardial infarction) (HCC)    States "heart attack" 2/95 in [place]  . Obesity   . Peptic ulcer    GIB 1994    Past Surgical History:  Procedure Laterality Date  . CARDIAC CATHETERIZATION N/A 03/15/2016   Procedure: Right/Left Heart Cath and Coronary Angiography;  Surgeon: Iran OuchMuhammad A Arida, MD;  [facility];  Service: Cardiovascular;  Laterality: N/A;  . COLONOSCOPY  03/23/2011   hemorrhoids  . HIP ARTHROSCOPY    . KNEE ARTHROSCOPY     Right knee    Current Outpatient Prescriptions  Medication Sig Dispense Refill  . acetaminophen (TYLENOL) 650 MG CR tablet Take one by mouth twice daily     . aspirin 81 MG tablet Take 81 mg by mouth daily.      Marland Kitchen. atorvastatin (LIPITOR) 80 MG tablet Take 1 tablet (80 mg total) by mouth daily. 30 tablet 1  . clopidogrel (PLAVIX) 75 MG tablet Take 1 tablet (75 mg total) by mouth daily. 30 tablet 3  . Cyanocobalamin (VITAMIN B-12) 2500 MCG SUBL Take one by mouth twice daily      . levothyroxine (SYNTHROID) 100 MCG tablet Take 100 mcg by mouth daily before breakfast.     . Omega-3 Fatty Acids (FISH OIL) 1200 MG CAPS Take one by mouth twice daily      . pantoprazole (PROTONIX) 40 MG tablet  Take 1 tablet (40 mg total) by mouth daily. 90 tablet 1  . predniSONE (DELTASONE) 50 MG tablet Take 1 tablet (50 mg total) by mouth daily with breakfast. 5 tablet 0  . sacubitril-valsartan (ENTRESTO) 24-26 MG Take 1 tablet by mouth 2 (two) times daily. 60 tablet 2  . carvedilol (COREG) 3.125 MG tablet Take 1 tablet (3.125 mg total) by mouth 2 (two) times daily. 180 tablet 3   Current Facility-Administered Medications  Medication Dose Route Frequency Provider Last Rate Last Dose  . 0.9 %  sodium chloride infusion  500 mL Intravenous Continuous Iva Boop, MD       Allergies:  Review of patient's allergies indicates no known allergies.   Social History: The patient  reports that he has been smoking  Cigarettes.  He started smoking about 55 years ago. He has been smoking about 1.00 pack per day. He has never used smokeless tobacco. He reports that he does not drink alcohol or use drugs.   ROS:  Please see the history of present illness. Otherwise, complete review of systems is positive for fatigue, no palpitations or syncope.  All other systems are reviewed and negative.   Physical Exam: VS:  BP (!) 82/74   Pulse 78   Ht 5\' 11"  (1.803 m)   Wt 240 lb (108.9 kg)   SpO2 97%   BMI 33.47 kg/m , BMI Body mass index is 33.47 kg/m.  Wt Readings from Last 3 Encounters:  05/18/16 240 lb (108.9 kg)  04/18/16 242 lb (109.8 kg)  04/04/16 244 lb (110.7 kg)    General: Obese male, appears comfortable at rest. HEENT: Conjunctiva and lids normal, oropharynx clear. Neck: Supple, no elevated JVP or carotid bruits, no thyromegaly. Lungs: Clear to auscultation, nonlabored breathing at rest. Cardiac: Regular rate and rhythm, no S3 or significant systolic murmur, no pericardial rub. Abdomen: Soft, nontender, no hepatomegaly, bowel sounds present, no guarding or rebound. Extremities: No pitting edema, distal pulses 2+. Skin: Warm and dry. Musculoskeletal: No kyphosis. Neuropsychiatric: Alert and oriented x3, affect grossly appropriate.  ECG: I personally reviewed the tracing from 03/13/2016 which showed sinus rhythm with left anterior fascicular block and anterolateral infarct pattern.  Recent Labwork: 03/12/2016: B Natriuretic Peptide 361.0 03/15/2016: TSH 0.695 04/18/2016: ALT 21; AST 21; BUN 9; Creatinine, Ser 0.79; Hemoglobin 15.9; Platelets 144; Potassium 3.9; Sodium 137   Other Studies Reviewed Today:  Echocardiogram 05/01/2016: Study Conclusions  - Left ventricle: The cavity size was normal. Wall thickness was at   the upper limits of normal. Systolic function was mildly to   moderately reduced. The estimated ejection fraction was in the   range of 40% to 45%. There is akinesis of the    mid-apicalanteroseptal myocardium. There is akinesis of the   apicalinferior myocardium. Doppler parameters are consistent with   abnormal left ventricular relaxation (grade 1 diastolic   dysfunction). - Aortic valve: Mildly calcified annulus. Trileaflet. - Right atrium: Central venous pressure (est): 3 mm Hg. - Pulmonary arteries: Systolic pressure could not be accurately   estimated. - Pericardium, extracardiac: There was no pericardial effusion.  Impressions:  - Upper normal LV wall thickness with LVEF 40-45%. There is mid to   apical anteroseptal and apical inferior akinesis. Grade 1   diastolic dysfunction. Mildly calcified aortic annulus. Compared   to the previous study in July there has been improvement in LVEF.  Assessment and Plan:  1. Ischemic cardiomyopathy with LVEF up to the range of 40-45% by recent follow-up  echocardiogram. He is no longer in range to consider ICD and his Lifevest has been discontinued already. Plan will be to continue medical therapy. We will try and keep him on Entresto at low dose, and reduce his Coreg to 3.125 mg twice daily.  2. CAD status post late presentation anterior infarct in July with occluded LAD and collaterals managed medically. He does not report any angina at this time. Continues on aspirin and Plavix.  3. Hyperlipidemia, currently on Lipitor. Will need to have follow-up lipids done eventually. He follows with Dr. Lysbeth Galas.  4. GERD, on Protonix. He is tolerating DAPT.  Current medicines were reviewed with the patient today.  Disposition: Follow-up with me in 3 months.  Signed, Jonelle Sidle, MD, [facility] 05/18/2016 2:13 PM    [facility] [address] [place], [place] [address] Phone: [phone]; Fax: [phone]

## 2016-08-22 ENCOUNTER — Encounter: Payer: Self-pay | Admitting: Cardiology

## 2016-08-22 NOTE — Progress Notes (Signed)
Cardiology Office Note  Date: 08/23/2016   ID: Bruce SabinJohn Barrera, DOB Aug 08, 1951, MRN [id number]  PCP: Josue HectorNYLAND,LEONARD ROBERT, MD  Primary Cardiologist: Nona DellSamuel Booker Bhatnagar, MD   Chief Complaint  Patient presents with  . Coronary Artery Disease    History of Present Illness: Bruce Barrera is a 65 y.o. male last seen in September. He presents for a routine follow-up visit. Does not report any significant angina or nitroglycerin use on current medical regimen. He continues to follow with Dr. Lysbeth GalasNyland for primary care and had lab work done in November reviewed. His most recent LDL was 66.  Current cardiac regimen includes aspirin, Plavix, Coreg, Lipitor, and Entresto. Most recent echocardiogram showed LVEF 40-45% range. He has not manifested significant volume overload.  Today we discussed plan to continue medical therapy including Plavix for now, likely stop it around the time of his next visit. He did not undergo PCI with his subacute anterior infarct as outlined below. Also discussed a walking regimen for exercise and weight loss.  Past Medical History:  Diagnosis Date  . Arthritis    Hips and knees  . Back pain   . Chronic systolic CHF (congestive heart failure) (HCC)   . Coronary artery disease    a. 03/2016 subacute anterior MI -> 100% prox LAD >72 hours out thus treated medically, LVEF 30-35% initially  . Essential hypertension   . GERD (gastroesophageal reflux disease)   . Hemorrhoids   . History of stroke    States "stroke" with left sided weakness 5/95 in [place] in [place]  . Hyperlipidemia   . Hypotension   . Hypothyroidism   . Ischemic cardiomyopathy   . MI (myocardial infarction)    States "heart attack" 2/95 in [place]  . Obesity   . Peptic ulcer    GIB 1994    Past Surgical History:  Procedure Laterality Date  . CARDIAC CATHETERIZATION N/A 03/15/2016   Procedure: Right/Left Heart Cath and Coronary Angiography;  Surgeon: Iran OuchMuhammad A Arida, MD;  Location: [facility];  Service: Cardiovascular;  Laterality: N/A;  . COLONOSCOPY  03/23/2011   hemorrhoids  . HIP ARTHROSCOPY    . KNEE ARTHROSCOPY     Right knee    Current Outpatient Prescriptions  Medication Sig Dispense Refill  . acetaminophen (TYLENOL) 650 MG CR tablet Take one by mouth twice daily     . aspirin 81 MG tablet Take 81 mg by mouth daily.      Marland Kitchen. atorvastatin (LIPITOR) 80 MG tablet Take 1 tablet (80 mg total) by mouth daily. 30 tablet 1  . clopidogrel (PLAVIX) 75 MG tablet Take 1 tablet (75 mg total) by mouth daily. 30 tablet 3  . Cyanocobalamin (VITAMIN B-12) 2500 MCG SUBL Take one by mouth twice daily      . levothyroxine (SYNTHROID) 100 MCG tablet Take 100 mcg by mouth daily before breakfast.     . Omega-3 Fatty Acids (FISH OIL) 1200 MG CAPS Take one by mouth twice daily      . pantoprazole (PROTONIX) 40 MG tablet Take 1 tablet (40 mg total) by mouth daily. 90 tablet 1  . predniSONE (DELTASONE) 50 MG tablet Take 1 tablet (50 mg total) by mouth daily with breakfast. 5 tablet 0  . sacubitril-valsartan (ENTRESTO) 24-26 MG Take 1 tablet by mouth 2 (two) times daily. 60 tablet 2  . carvedilol (COREG) 3.125 MG tablet Take 1 tablet (3.125 mg total) by mouth 2 (two) times daily. 180 tablet 3  Current Facility-Administered Medications  Medication Dose Route Frequency Provider Last Rate Last Dose  . 0.9 %  sodium chloride infusion  500 mL Intravenous Continuous Iva Boop, MD       Allergies:  Patient has no known allergies.   Social History: The patient  reports that he has been smoking Cigarettes.  He started smoking about 55 years ago. He has been smoking about 1.00 pack per day. He has never used smokeless tobacco. He reports that he does not drink alcohol or use drugs.   ROS:  Please see the history of present illness. Otherwise, complete review of systems is positive for recent cold symptoms.  All other systems are reviewed and negative.   Physical Exam: VS:  BP (!) 150/88    Pulse 81   Ht 5\' 11"  (1.803 m)   Wt 247 lb (112 kg)   SpO2 96%   BMI 34.45 kg/m , BMI Body mass index is 34.45 kg/m.  Wt Readings from Last 3 Encounters:  08/23/16 247 lb (112 kg)  05/18/16 240 lb (108.9 kg)  04/18/16 242 lb (109.8 kg)    General: Obese male, appears comfortable at rest. HEENT: Conjunctiva and lids normal, oropharynx clear. Neck: Supple, no elevated JVP or carotid bruits, no thyromegaly. Lungs: Clear to auscultation, nonlabored breathing at rest. Cardiac: Regular rate and rhythm, no S3 or significant systolic murmur, no pericardial rub. Abdomen: Soft, nontender, no hepatomegaly, bowel sounds present, no guarding or rebound. Extremities: No pitting edema, distal pulses 2+. Skin: Warm and dry. Musculoskeletal: No kyphosis. Neuropsychiatric: Alert and oriented x3, affect grossly appropriate.  ECG: I personally reviewed the tracing from 03/13/2016 which showed sinus rhythm with left anterior fascicular block and anterolateral infarct pattern.  Recent Labwork: 03/12/2016: B Natriuretic Peptide 361.0 03/15/2016: TSH 0.695 04/18/2016: ALT 21; AST 21; BUN 9; Creatinine, Ser 0.79; Hemoglobin 15.9; Platelets 144; Potassium 3.9; Sodium 137  November 2017: Hemoglobin 15.8, platelets 168, BUN 13, creatinine 0.8, potassium 3.8, AST 24, ALT 17, cholesterol 120, triglycerides 129, HDL 28, LDL 66  Other Studies Reviewed Today:  Echocardiogram 05/01/2016: Study Conclusions  - Left ventricle: The cavity size was normal. Wall thickness was at the upper limits of normal. Systolic function was mildly to moderately reduced. The estimated ejection fraction was in the range of 40% to 45%. There is akinesis of the mid-apicalanteroseptal myocardium. There is akinesis of the apicalinferior myocardium. Doppler parameters are consistent with abnormal left ventricular relaxation (grade 1 diastolic dysfunction). - Aortic valve: Mildly calcified annulus. Trileaflet. - Right  atrium: Central venous pressure (est): 3 mm Hg. - Pulmonary arteries: Systolic pressure could not be accurately estimated. - Pericardium, extracardiac: There was no pericardial effusion.  Impressions:  - Upper normal LV wall thickness with LVEF 40-45%. There is mid to apical anteroseptal and apical inferior akinesis. Grade 1 diastolic dysfunction. Mildly calcified aortic annulus. Compared to the previous study in July there has been improvement in LVEF.  Assessment and Plan:  1. CAD status post subacute anterior infarct in July with occluded LAD that was managed medically given late presentation. He is symptomatically stable on medical therapy and we will continue with observation for now.  2. Ischemic cardiomyopathy, LVEF improved to the range of 40-45% by follow-up echocardiogram.  3. Hyperlipidemia, continue his on Lipitor with LDL 66.  4. Essential hypertension, tolerating current regimen. Depending on blood pressure trend consider increasing Entresto to next dose.  Current medicines were reviewed with the patient today.  Disposition: Follow-up in 6 months.  Signed, Jonelle SidleSamuel G. Benicia Bergevin, MD, [facility] 08/23/2016 10:59 AM    [facility] [address], [place], [place] [address] Phone: [phone]; Fax: [phone]

## 2016-08-23 ENCOUNTER — Ambulatory Visit (INDEPENDENT_AMBULATORY_CARE_PROVIDER_SITE_OTHER): Payer: Medicaid Other | Admitting: Cardiology

## 2016-08-23 ENCOUNTER — Encounter: Payer: Self-pay | Admitting: Cardiology

## 2016-08-23 VITALS — BP 150/88 | HR 81 | Ht 71.0 in | Wt 247.0 lb

## 2016-08-23 DIAGNOSIS — I2510 Atherosclerotic heart disease of native coronary artery without angina pectoris: Secondary | ICD-10-CM | POA: Diagnosis not present

## 2016-08-23 DIAGNOSIS — I10 Essential (primary) hypertension: Secondary | ICD-10-CM

## 2016-08-23 DIAGNOSIS — I255 Ischemic cardiomyopathy: Secondary | ICD-10-CM | POA: Diagnosis not present

## 2016-08-23 DIAGNOSIS — E782 Mixed hyperlipidemia: Secondary | ICD-10-CM

## 2016-08-23 NOTE — Patient Instructions (Signed)

## 2017-02-09 NOTE — Progress Notes (Signed)
Cardiology Office Note  Date: 02/12/2017   ID: Bruce Barrera, DOB 1951/08/15, MRN [id number]  PCP: Bruce Catching, MD  Primary Cardiologist: Bruce Dell, MD   Chief Complaint  Patient presents with  . Coronary Artery Disease  . Cardiomyopathy    History of Present Illness: Bruce Barrera is a 66 y.o. male last seen in December 2017. He presents for a routine follow-up visit. Does not report any obvious angina symptoms. He does have intermittent leg edema although has had no change in weight. He is not on a standing diuretic at this time. I reviewed his medications which are outlined below. He states that he ran out of Coreg a few months ago. Does not recall having any intolerances. He is nearly a year out from his late presentation anterior infarct that was managed medically. We discussed stopping Plavix in July.  Follow-up lab work from March with Dr. Lysbeth Barrera is outlined below. He reports compliance with Lipitor. Blood pressure is well controlled today.  Last echocardiogram was in August 2017.  Past Medical History:  Diagnosis Date  . Arthritis    Hips and knees  . Back pain   . Chronic systolic CHF (congestive heart failure) (HCC)   . Coronary artery disease    a. 03/2016 subacute anterior MI -> 100% prox LAD >72 hours out thus treated medically, LVEF 30-35% initially  . Essential hypertension   . GERD (gastroesophageal reflux disease)   . Hemorrhoids   . History of stroke    States "stroke" with left sided weakness 5/95 in prison in [place]  . Hyperlipidemia   . Hypotension   . Hypothyroidism   . Ischemic cardiomyopathy   . MI (myocardial infarction) (HCC)    States "heart attack" 2/95 in prison in [place]  . Obesity   . Peptic ulcer    GIB 1994    Past Surgical History:  Procedure Laterality Date  . CARDIAC CATHETERIZATION N/A 03/15/2016   Procedure: Right/Left Heart Cath and Coronary Angiography;  Surgeon: Bruce Ouch, MD;  [facility]: [facility];  Service:  Cardiovascular;  Laterality: N/A;  . COLONOSCOPY  03/23/2011   hemorrhoids  . HIP ARTHROSCOPY    . KNEE ARTHROSCOPY     Right knee    Current Outpatient Prescriptions  Medication Sig Dispense Refill  . acetaminophen (TYLENOL) 650 MG CR tablet Take one by mouth twice daily     . aspirin 81 MG tablet Take 81 mg by mouth daily.      Marland Kitchen atorvastatin (LIPITOR) 80 MG tablet Take 1 tablet (80 mg total) by mouth daily. 30 tablet 1  . clopidogrel (PLAVIX) 75 MG tablet Take 1 tablet (75 mg total) by mouth daily. 30 tablet 3  . Cyanocobalamin (VITAMIN B-12) 2500 MCG SUBL Take one by mouth twice daily      . levothyroxine (SYNTHROID) 100 MCG tablet Take 100 mcg by mouth daily before breakfast.     . Omega-3 Fatty Acids (FISH OIL) 1200 MG CAPS Take one by mouth twice daily      . pantoprazole (PROTONIX) 40 MG tablet Take 1 tablet (40 mg total) by mouth daily. 90 tablet 1  . predniSONE (DELTASONE) 50 MG tablet Take 1 tablet (50 mg total) by mouth daily with breakfast. 5 tablet 0  . sacubitril-valsartan (ENTRESTO) 24-26 MG Take 1 tablet by mouth 2 (two) times daily. 60 tablet 2  . carvedilol (COREG) 3.125 MG tablet Take 1 tablet (3.125 mg total) by mouth 2 (two) times daily.  180 tablet 1   Current Facility-Administered Medications  Medication Dose Route Frequency Provider Last Rate Last Dose  . 0.9 %  sodium chloride infusion  500 mL Intravenous Continuous Bruce Boop, MD       Allergies:  Patient has no known allergies.   Social History: The patient  reports that he has been smoking Cigarettes.  He started smoking about 55 years ago. He has been smoking about 1.00 pack per day. He has never used smokeless tobacco. He reports that he does not drink alcohol or use drugs.   ROS:  Please see the history of present illness. Otherwise, complete review of systems is positive for chronic arthritic stiffness.  All other systems are reviewed and negative.   Physical Exam: VS:  BP 128/68   Pulse 82   Ht  5\' 11"  (1.803 m)   Wt 246 lb (111.6 kg)   SpO2 95%   BMI 34.31 kg/m , BMI Body mass index is 34.31 kg/m.  Wt Readings from Last 3 Encounters:  02/12/17 246 lb (111.6 kg)  08/23/16 247 lb (112 kg)  05/18/16 240 lb (108.9 kg)    General: Obese male,appears comfortable at rest. HEENT: Conjunctiva and lids normal, oropharynx clear. Neck: Supple, no elevated JVP or carotid bruits, no thyromegaly. Lungs: Clear to auscultation, nonlabored breathing at rest. Cardiac: Regular rate and rhythm, no S3 or significant systolic murmur, no pericardial rub. Abdomen: Soft, nontender, no hepatomegaly, bowel sounds present, no guarding or rebound. Extremities: No pitting edema, distal pulses 2+. Skin: Warm and dry. Musculoskeletal: No kyphosis. Neuropsychiatric: Alert and oriented x3, affect grossly appropriate.  ECG: I personally reviewed the tracing from 03/13/2016 which showed sinus rhythm with left anterior fascicular block and anterolateral infarct pattern.  Recent Labwork: 03/12/2016: B Natriuretic Peptide 361.0 03/15/2016: TSH 0.695 04/18/2016: ALT 21; AST 21; BUN 9; Creatinine, Ser 0.79; Hemoglobin 15.9; Platelets 144; Potassium 3.9; Sodium 137  March 2018: Hemoglobin 17.1, platelets 141, BUN 14, creatinine 0.89, potassium 3.9, AST 23, ALT 24, cholesterol 154, triglycerides 101, HDL 35, LDL 99  Other Studies Reviewed Today:  Echocardiogram 05/01/2016: Study Conclusions  - Left ventricle: The cavity size was normal. Wall thickness was at the upper limits of normal. Systolic function was mildly to moderately reduced. The estimated ejection fraction was in the range of 40% to 45%. There is akinesis of the mid-apicalanteroseptal myocardium. There is akinesis of the apicalinferior myocardium. Doppler parameters are consistent with abnormal left ventricular relaxation (grade 1 diastolic dysfunction). - Aortic valve: Mildly calcified annulus. Trileaflet. - Right atrium: Central  venous pressure (est): 3 mm Hg. - Pulmonary arteries: Systolic pressure could not be accurately estimated. - Pericardium, extracardiac: There was no pericardial effusion.  Impressions:  - Upper normal LV wall thickness with LVEF 40-45%. There is mid to apical anteroseptal and apical inferior akinesis. Grade 1 diastolic dysfunction. Mildly calcified aortic annulus. Compared to the previous study in July there has been improvement in LVEF.  Assessment and Plan:  1. CAD with known occlusion of the LAD associated with subacute anterior infarct last July. He is clinically stable without active angina symptoms. Recommend resuming Coreg at 3.125 mg twice daily, particularly with associated cardiomyopathy. He should be able to stop Plavix in July.  2. Ischemic cardiomyopathy, last LVEF 40-45% as of August 2017. We will repeat a follow-up study for his next visit.  3. Hyperlipidemia, on high-dose Lipitor with LDL 99 by last checked.  4. Essential hypertension, systolic blood pressure in the 120s today.  Current medicines were reviewed with the patient today.   Orders Placed This Encounter  Procedures  . ECHOCARDIOGRAM COMPLETE    Disposition: Follow-up in 4 months.  Signed, Bruce SidleSamuel G. Bocephus Cali, MD, [facility] 02/12/2017 1:31 PM    [facility] [address], [place], [place] [address] Phone: [phone]; Fax: [phone]

## 2017-02-12 ENCOUNTER — Other Ambulatory Visit: Payer: Self-pay | Admitting: Cardiology

## 2017-02-12 ENCOUNTER — Ambulatory Visit (INDEPENDENT_AMBULATORY_CARE_PROVIDER_SITE_OTHER): Payer: Medicare Other | Admitting: Cardiology

## 2017-02-12 ENCOUNTER — Encounter: Payer: Self-pay | Admitting: Cardiology

## 2017-02-12 ENCOUNTER — Other Ambulatory Visit: Payer: Self-pay | Admitting: Physician Assistant

## 2017-02-12 VITALS — BP 128/68 | HR 82 | Ht 71.0 in | Wt 246.0 lb

## 2017-02-12 DIAGNOSIS — I10 Essential (primary) hypertension: Secondary | ICD-10-CM | POA: Diagnosis not present

## 2017-02-12 DIAGNOSIS — E782 Mixed hyperlipidemia: Secondary | ICD-10-CM

## 2017-02-12 DIAGNOSIS — I255 Ischemic cardiomyopathy: Secondary | ICD-10-CM | POA: Diagnosis not present

## 2017-02-12 DIAGNOSIS — I2510 Atherosclerotic heart disease of native coronary artery without angina pectoris: Secondary | ICD-10-CM

## 2017-02-12 MED ORDER — CARVEDILOL 3.125 MG PO TABS: 3.1250 mg | ORAL_TABLET | Freq: Two times a day (BID) | ORAL | 1 refills | Status: DC

## 2017-02-12 NOTE — Patient Instructions (Signed)
Your physician recommends that you schedule a follow-up appointment in: 4 MONTHS WITH DR MCDOWELL  Your physician has recommended you make the following change in your medication:   STOP PLAVIX IN July 2018  WE HAVE REFILLED YOUR COREG   Your physician has requested that you have an echocardiogram IN 4 MONTHS JUST PRIOR TO YOUR NEXT OFFICE VISIT. Echocardiography is a painless test that uses sound waves to create images of your heart. It provides your doctor with information about the size and shape of your heart and how well your heart's chambers and valves are working. This procedure takes approximately one hour. There are no restrictions for this procedure.  Thank you for choosing [facility]!!

## 2017-04-16 IMAGING — US US EXTREM LOW VENOUS BILAT
1 series · 13 of 24 positions shown · non-contrast
Comparison: None.

CLINICAL DATA: 65-year-old male with a history of lower extremity
edema



[Series 1: us extrem low venous bilat · 0.09mm/px · 13 of 89 slices shown]
[im 1/89]
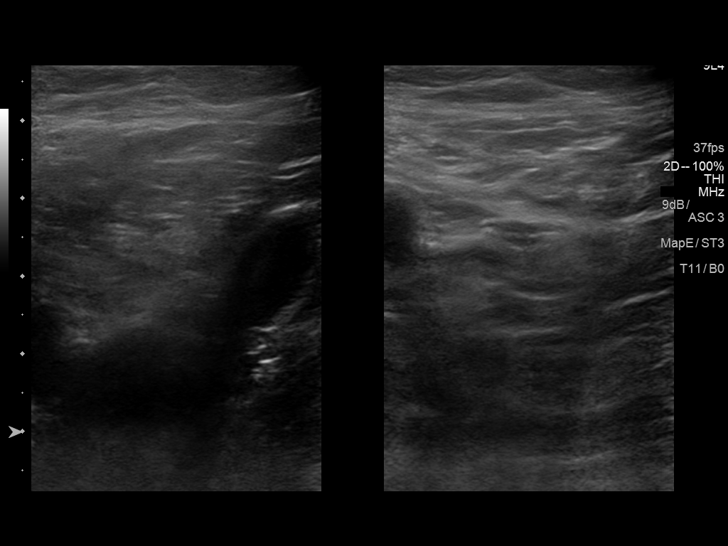
[im 8/89]
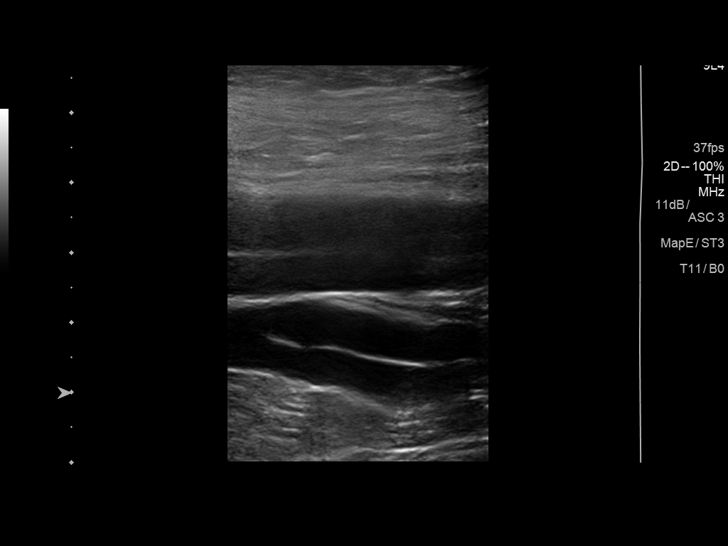
[im 16/89]
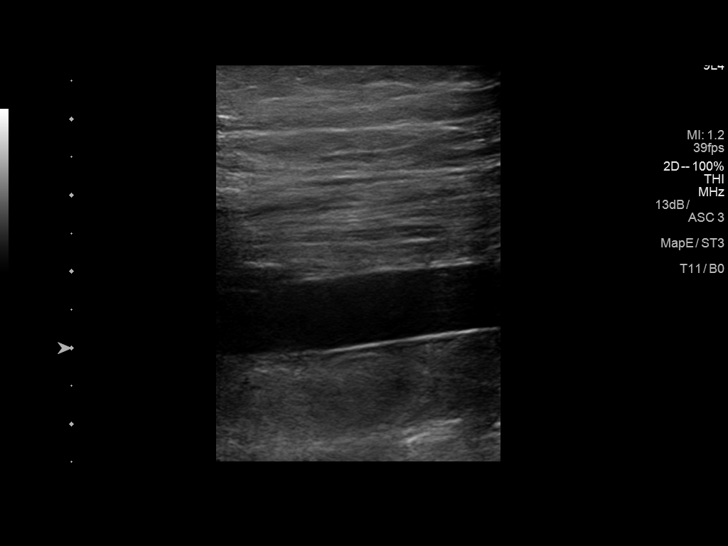
[im 23/89]
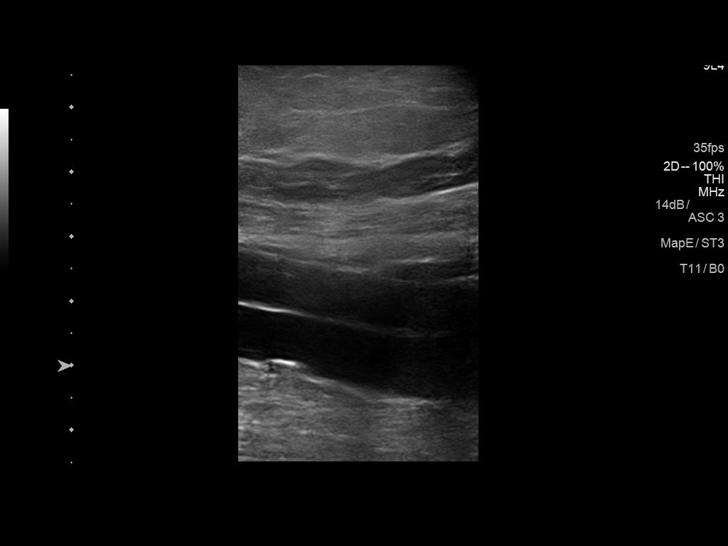
[im 31/89]
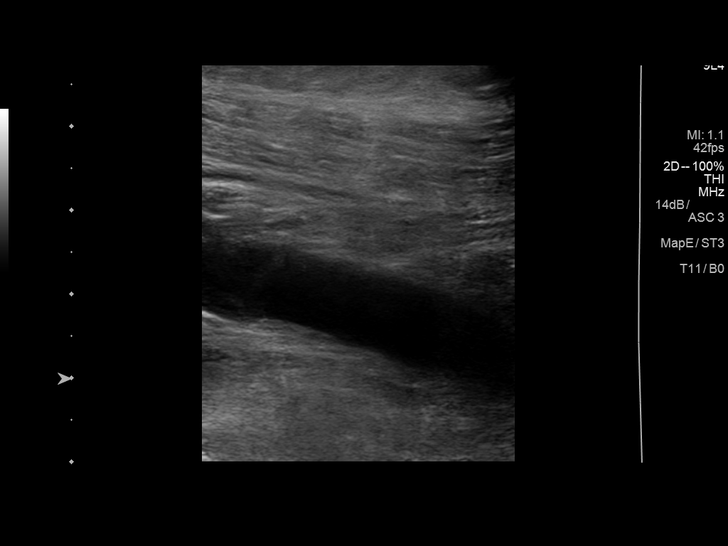
[im 39/89]
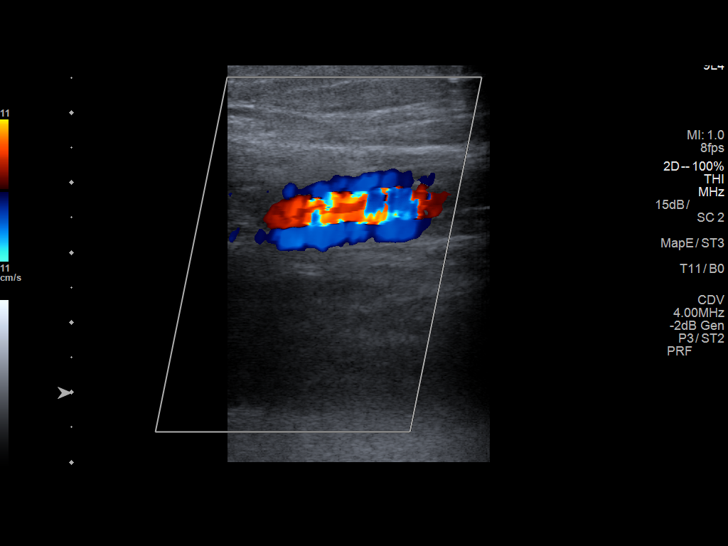
[im 46/89]
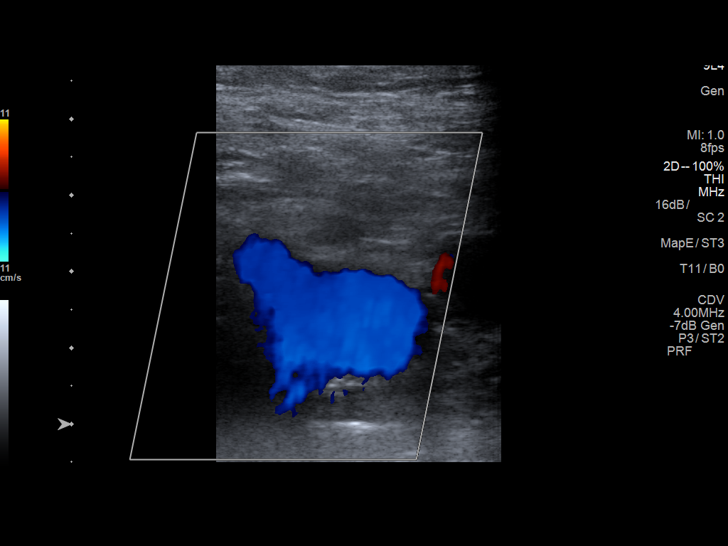
[im 50/89]
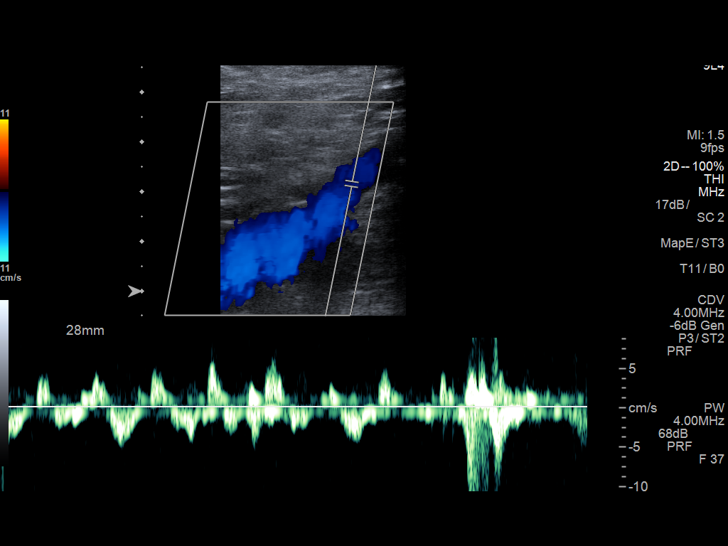
[im 58/89]
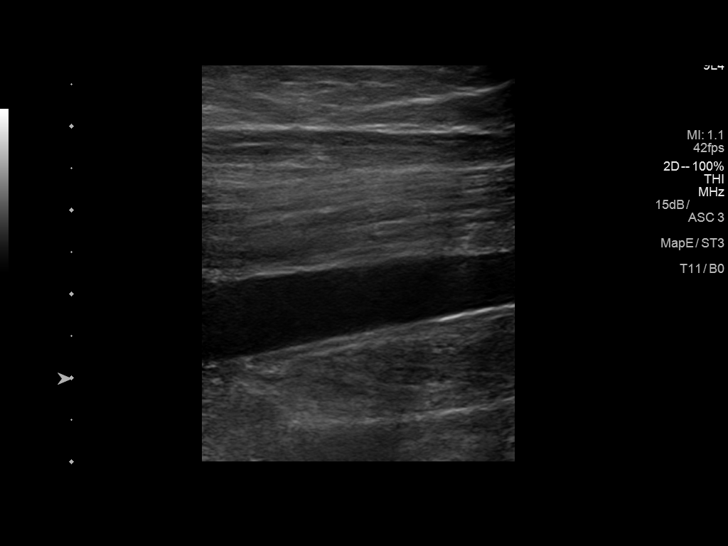
[im 66/89]
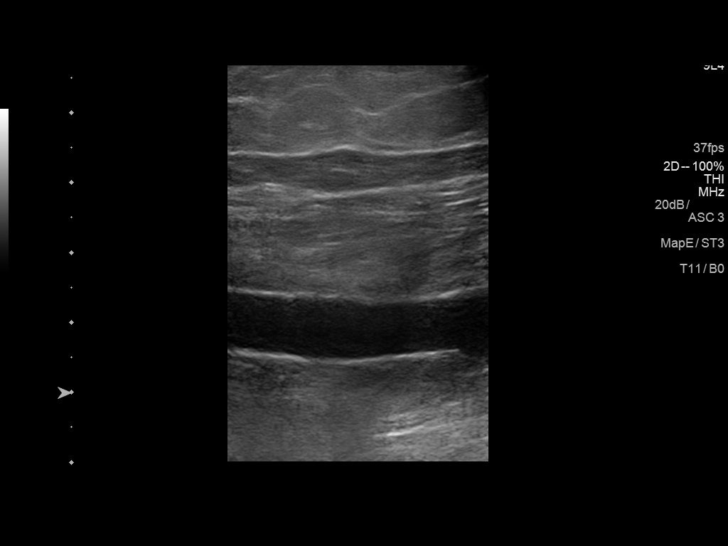
[im 73/89]
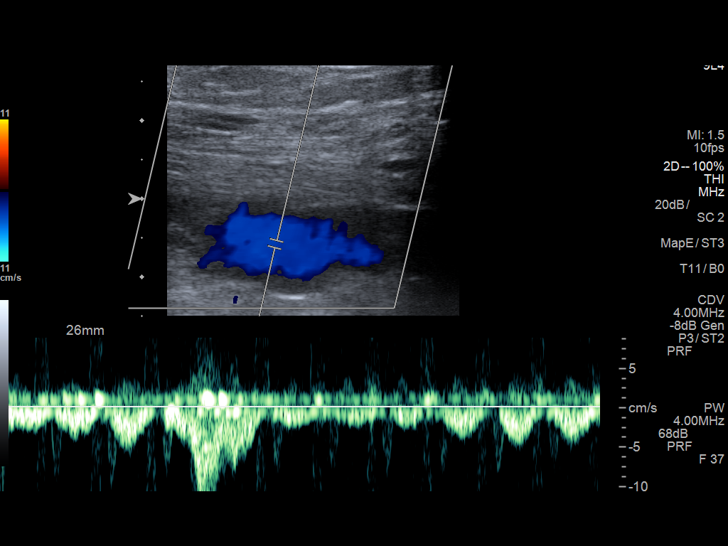
[im 81/89]
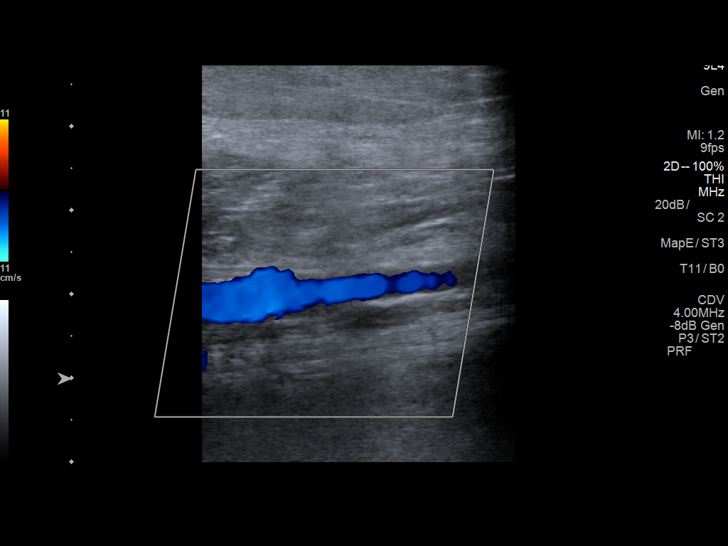
[im 89/89]
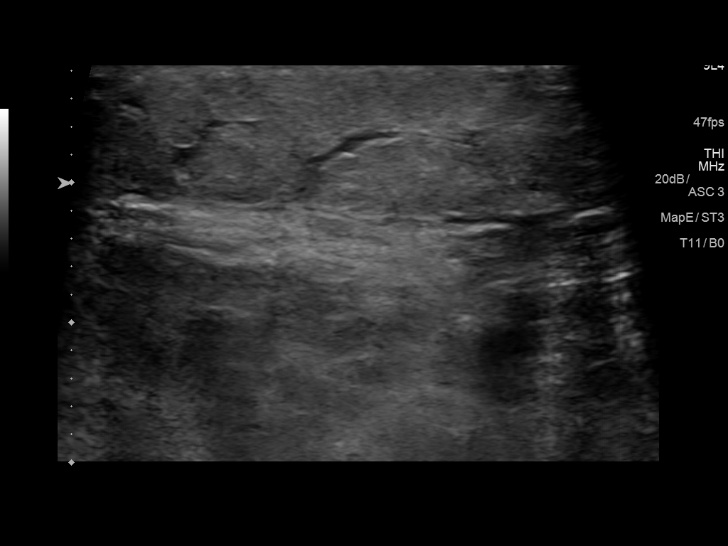

[13 of 24 positions shown; findings below may reference images not displayed]

FINDINGS: RIGHT LOWER EXTREMITY

Common Femoral Vein: No evidence of thrombus. Normal
compressibility, respiratory phasicity and response to augmentation.

Saphenofemoral Junction: No evidence of thrombus. Normal
compressibility and flow on color Doppler imaging.

Profunda Femoral Vein: No evidence of thrombus. Normal
compressibility and flow on color Doppler imaging.

Femoral Vein: No evidence of thrombus. Normal compressibility,
respiratory phasicity and response to augmentation.

Popliteal Vein: No evidence of thrombus. Normal compressibility,
respiratory phasicity and response to augmentation.

Calf Veins: No thrombus of the posterior tibial vein. Peroneal vein
not visualized completely.

Superficial Great Saphenous Vein: No evidence of thrombus. Normal
compressibility and flow on color Doppler imaging.

Other Findings:  Edema of the lower extremity.

LEFT LOWER EXTREMITY

Common Femoral Vein: No evidence of thrombus. Normal
compressibility, respiratory phasicity and response to augmentation.

Saphenofemoral Junction: No evidence of thrombus. Normal
compressibility and flow on color Doppler imaging.

Profunda Femoral Vein: No evidence of thrombus. Normal
compressibility and flow on color Doppler imaging.

Femoral Vein: No evidence of thrombus. Normal compressibility,
respiratory phasicity and response to augmentation.

Popliteal Vein: No evidence of thrombus. Normal compressibility,
respiratory phasicity and response to augmentation.

Calf Veins: No evidence of thrombus. Normal compressibility and flow
on color Doppler imaging.

Superficial Great Saphenous Vein: No evidence of thrombus. Normal
compressibility and flow on color Doppler imaging.

Other Findings:  Edema of lower extremity.
IMPRESSION: Sonographic survey bilateral lower extremities negative for DVT.

Bilateral lower extremity edema.

## 2017-06-18 ENCOUNTER — Ambulatory Visit (HOSPITAL_COMMUNITY)
Admission: RE | Admit: 2017-06-18 | Discharge: 2017-06-18 | Disposition: A | Discharge status: Home / Self Care | Payer: Medicare Other | Source: Ambulatory Visit | Attending: Cardiology | Admitting: Cardiology

## 2017-06-18 DIAGNOSIS — I252 Old myocardial infarction: Secondary | ICD-10-CM | POA: Diagnosis not present

## 2017-06-18 DIAGNOSIS — I255 Ischemic cardiomyopathy: Secondary | ICD-10-CM | POA: Diagnosis present

## 2017-06-18 DIAGNOSIS — J449 Chronic obstructive pulmonary disease, unspecified: Secondary | ICD-10-CM | POA: Insufficient documentation

## 2017-06-18 DIAGNOSIS — I209 Angina pectoris, unspecified: Secondary | ICD-10-CM | POA: Diagnosis not present

## 2017-06-18 DIAGNOSIS — E785 Hyperlipidemia, unspecified: Secondary | ICD-10-CM | POA: Diagnosis not present

## 2017-06-18 DIAGNOSIS — I071 Rheumatic tricuspid insufficiency: Secondary | ICD-10-CM | POA: Insufficient documentation

## 2017-06-18 DIAGNOSIS — Z720 Tobacco use: Secondary | ICD-10-CM | POA: Diagnosis not present

## 2017-06-18 DIAGNOSIS — I10 Essential (primary) hypertension: Secondary | ICD-10-CM | POA: Diagnosis not present

## 2017-06-18 DIAGNOSIS — I371 Nonrheumatic pulmonary valve insufficiency: Secondary | ICD-10-CM | POA: Insufficient documentation

## 2017-06-18 LAB — ECHOCARDIOGRAM COMPLETE
E decel time: 190 msec
E/e' ratio: 12.42
FS: 25 % — AB (ref 28–44)
IVS/LV PW RATIO, ED: 0.82
LA ID, A-P, ES: 37 mm
LA diam end sys: 37 mm
LA diam index: 1.61 cm/m2
LA vol A4C: 41.8 ml
LA vol index: 22.2 mL/m2
LA vol: 51 mL
LV E/e' medial: 12.42
LV E/e'average: 12.42
LV PW d: 10.5 mm — AB (ref 0.6–1.1)
LV e' LATERAL: 7.18 cm/s
LVOT area: 3.46 cm2
LVOT diameter: 21 mm
Lateral S' vel: 11 cm/s
MV Dec: 190
MV Peak grad: 3 mmHg
MV pk A vel: 98.5 m/s
MV pk E vel: 89.2 m/s
Reg peak vel: 213 cm/s
TAPSE: 25.3 mm
TDI e' lateral: 7.18
TDI e' medial: 7.18
TR max vel: 213 cm/s

## 2017-06-18 MED ORDER — PERFLUTREN LIPID MICROSPHERE
1.0000 mL | INTRAVENOUS | Status: AC | PRN
  Administered 2017-06-18: 2 mL via INTRAVENOUS
  Filled 2017-06-18: qty 10

## 2017-06-18 NOTE — Progress Notes (Signed)
*  PRELIMINARY RESULTS* Echocardiogram 2D Echocardiogram with definity has been performed.  Jeryl Columbia 06/18/2017, 1:11 PM

## 2017-06-25 ENCOUNTER — Ambulatory Visit (INDEPENDENT_AMBULATORY_CARE_PROVIDER_SITE_OTHER): Payer: Medicare Other | Admitting: Cardiology

## 2017-06-25 ENCOUNTER — Encounter: Payer: Self-pay | Admitting: Cardiology

## 2017-06-25 ENCOUNTER — Ambulatory Visit: Payer: Medicare Other | Admitting: Cardiology

## 2017-06-25 VITALS — BP 118/72 | HR 86 | Ht 71.0 in | Wt 237.0 lb

## 2017-06-25 DIAGNOSIS — Z720 Tobacco use: Secondary | ICD-10-CM

## 2017-06-25 DIAGNOSIS — I255 Ischemic cardiomyopathy: Secondary | ICD-10-CM | POA: Diagnosis not present

## 2017-06-25 DIAGNOSIS — E782 Mixed hyperlipidemia: Secondary | ICD-10-CM

## 2017-06-25 DIAGNOSIS — I2510 Atherosclerotic heart disease of native coronary artery without angina pectoris: Secondary | ICD-10-CM

## 2017-06-25 NOTE — Patient Instructions (Signed)

## 2017-06-25 NOTE — Progress Notes (Signed)
Cardiology Office Note  Date: 06/25/2017   ID: Bruce Barrera, DOB 1950-12-17, MRN [id number]  PCP: Joette Catching, MD  Primary Cardiologist: Nona Dell, MD   Chief Complaint  Patient presents with  . Coronary Artery Disease  . Cardiomyopathy    History of Present Illness: Bruce Barrera is a 66 y.o. male last seen in June. He presents for a routine follow-up visit. He does not report any chest pain or increasing shortness of breath, no palpitations or syncope. He tells me that he has not been taking his medications consistently, but has been saving up some money and also has better insurance coverage at this time.  Recent follow-up echocardiogram indicated reduction in LVEF to the range of 30-35% in comparison to previous assessment. We went over the results today.rather than changing medication doses, we discussed compliance with current therapy.  Resent medications are to include aspirin, Plavix, Coreg, Lipitor, and Entresto.  I reviewed lab work from July per Dr. Lysbeth Galas.  I personally reviewed his ECG today which shows sinus rhythm with old anterior infarct pattern and nonspic T-wave changes.  Past Medical History:  Diagnosis Date  . Arthritis    Hips and knees  . Back pain   . Chronic systolic CHF (congestive heart failure) (HCC)   . Coronary artery disease    a. 03/2016 subacute anterior MI -> 100% prox LAD >72 hours out thus treated medically, LVEF 30-35% initially  . Essential hypertension   . GERD (gastroesophageal reflux disease)   . Hemorrhoids   . History of stroke    States "stroke" with left sided weakness 5/95 in prison in [place]  . Hyperlipidemia   . Hypothyroidism   . Ischemic cardiomyopathy   . MI (myocardial infarction) (HCC)    States "heart attack" 2/95 in prison in [place]  . Obesity   . Peptic ulcer    GIB 1994    Past Surgical History:  Procedure Laterality Date  . CARDIAC CATHETERIZATION N/A 03/15/2016   Procedure: Right/Left Heart Cath and  Coronary Angiography;  Surgeon: Iran Ouch, MD;  [facility];  Service: Cardiovascular;  Laterality: N/A;  . COLONOSCOPY  03/23/2011   hemorrhoids  . HIP ARTHROSCOPY    . KNEE ARTHROSCOPY     Right knee    Current Outpatient Prescriptions  Medication Sig Dispense Refill  . albuterol (PROVENTIL HFA;VENTOLIN HFA) 108 (90 Base) MCG/ACT inhaler Inhale 2 puffs into the lungs 2 (two) times daily.     Marland Kitchen aspirin 81 MG tablet Take 81 mg by mouth daily.      Marland Kitchen atorvastatin (LIPITOR) 80 MG tablet Take 1 tablet (80 mg total) by mouth daily. 30 tablet 1  . budesonide-formoterol (SYMBICORT) 160-4.5 MCG/ACT inhaler Inhale 2 puffs into the lungs 2 (two) times daily.     . carvedilol (COREG) 3.125 MG tablet TAKE ONE TABLET BY MOUTH TWICE DAILY WITH A MEAL. 180 tablet 3  . clopidogrel (PLAVIX) 75 MG tablet Take 1 tablet (75 mg total) by mouth daily. 30 tablet 3  . levothyroxine (SYNTHROID) 100 MCG tablet Take 100 mcg by mouth daily before breakfast.     . predniSONE (DELTASONE) 50 MG tablet Take 1 tablet (50 mg total) by mouth daily with breakfast. 5 tablet 0  . sacubitril-valsartan (ENTRESTO) 24-26 MG Take 1 tablet by mouth 2 (two) times daily. 60 tablet 2   Current Facility-Administered Medications  Medication Dose Route Frequency Provider Last Rate Last Dose  . 0.9 %  sodium chloride  infusion  500 mL Intravenous Continuous Iva Boop, MD       Allergies:  Patient has no known allergies.   Social History: The patient  reports that he has been smoking Cigarettes.  He started smoking about 56 years ago. He has been smoking about 1.00 pack per day. He has never used smokeless tobacco. He reports that he does not drink alcohol or use drugs.   ROS:  Please see the history of present illness. Otherwise, complete review of systems is positive for none.  All other systems are reviewed and negative.   Physical Exam: VS:  BP 118/72   Pulse 86   Ht  (1.803 m)   Wt 237 lb  (107.5 kg)   SpO2 96%   BMI 33.05 kg/m , BMI Body mass index is 33.05 kg/m.  Wt Readings from Last 3 Encounters:  06/25/17 237 lb (107.5 kg)  02/12/17 246 lb (111.6 kg)  08/23/16 247 lb (112 kg)    General: Obese male, appears comfortable at rest. HEENT: Conjunctiva and lids normal, oropharynx clear. Neck: Supple, no elevated JVP or carotid bruits, no thyromegaly. Lungs: Clear to auscultation, nonlabored breathing at rest. Cardiac: Regular rate and rhythm, no S3 or significant systolic murmur, no pericardial rub. Abdomen: Soft, nontender, bowel sounds present, no guarding or rebound. Extremities: No pitting edema, distal pulses 2+. Skin: Warm and dry. Musculoskeletal: No kyphosis. Neuropsychiatric: Alert and oriented x3, affect grossly appropriate.  ECG: I personally reviewed the tracing from 03/13/2016 which showed sinus rhythm with left anterior fascicular block and old anterolateral infarct pattern.  Recent Labwork:  July 2018: Hemoglobin 16.8, platelets 129 BUN 14, creatinine 0.83, potassium 4.1, AST 20, ALT 20, cholesterol 101, triglycerides 118, HDL 31, LDL 46  Other Studies Reviewed Today:  Echocardiogram 06/18/2017: Study Conclusions  - Left ventricle: Septal and apical hypokinesis mid and basal   inferior and posterior lateral akinesis with area of calcified   myocardium in mid inferior wall   suggesting old infarct The cavity size was mildly dilated.   Systolic function was moderately to severely reduced. The   estimated ejection fraction was in the range of 30% to 35%. Wall   motion was normal; there were no regional wall motion   abnormalities. Left ventricular diastolic function parameters   were normal. - Atrial septum: No defect or patent foramen ovale was identified.  Cardiac catheterization 03/15/2016:  Prox LAD lesion, 100% stenosed.  Ost RCA lesion, 20% stenosed.  There is moderate to severe left ventricular systolic dysfunction.   1. Mildly elevated  filling pressures with mild pulmonary hypertension. Normal cardiac output. Mean pulmonary capillary wedge pressure was 18 mmHg. 2. Severe one-vessel coronary artery disease with an occluded proximal LAD after giving a large diagonal branch which supplies a large territory of the anterolateral wall. Otherwise no obstructive disease. Left dominant system. Ostial RCA spasm improved with nitroglycerin. Very faint right to left and left to left collaterals to the LAD. 3. Moderately reduced LV systolic function with an ejection fraction of 35%. Akinesis of mid to distal anterior and apical wall.  Assessment and Plan:  1. Ischemic cardiomyopathy, LVEF 30-35% based on recent echocardiogram. He admits that he has not been compliant with regular medical therapy and we discussed this today. At this point will not plan to increase medication doses. Would not pursue ICD as yet.  2. CAD with occluded LAD associated with subacute anterior infarct in July 2017. He reports no angina at this time.  3.  Hyperlipidemia, on Lipitor although not certain about compliance.recent LDL 46 down from 99.  4. Tobacco abuse. Smoking cessation discussed.  Current medicines were reviewed with the patient today.   Orders Placed This Encounter  Procedures  . EKG 12-Lead    Disposition: Follow-up in 6 months.  Signed, Jonelle Sidle, MD, [facility] 06/25/2017 2:20 PM    [facility] at [facility] [address] [place], [place] [address] Phone: [phone]; Fax: [phone]

## 2017-08-01 IMAGING — CT CT ANGIO CHEST
2 of 6 series · 18 of 46 positions shown · IV contrast (ISOVUE)
Comparison: Radiographs 2 hours prior. Radiograph 09/30/2013 also
reviewed.

CLINICAL DATA: Chest pain and shortness of breath since last night.
Elevated D-dimer.

EXAM:
CT ANGIOGRAPHY CHEST WITH CONTRAST
TECHNIQUE: Multidetector CT imaging of the chest was performed using the
standard protocol during bolus administration of intravenous
contrast. Multiplanar CT image reconstructions and MIPs were
obtained to evaluate the vascular anatomy.
CONTRAST:  100 cc Isovue 370 IV

[Series 5: pe thins 1.0 · axial · 0.74mm/px · z∈[-296,-32]mm · 15 of 294 slices shown]
[im 15/294  lung]
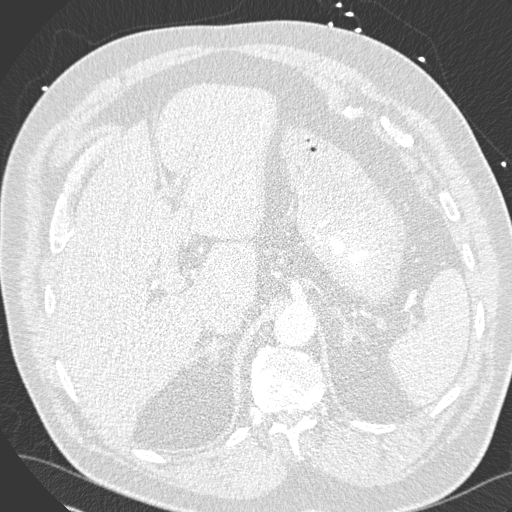
[im 30/294  soft-tissue]
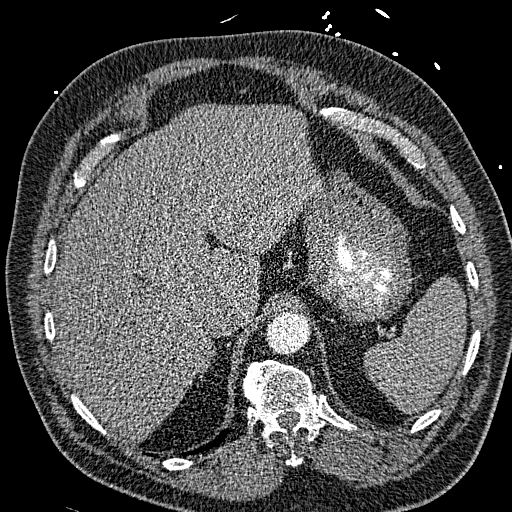
[im 59/294  lung]
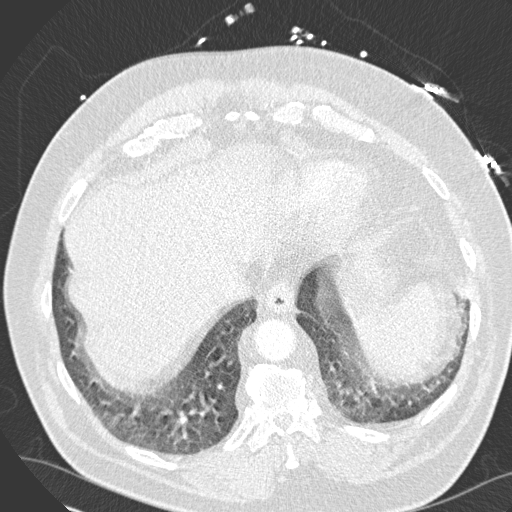
[im 74/294  soft-tissue]
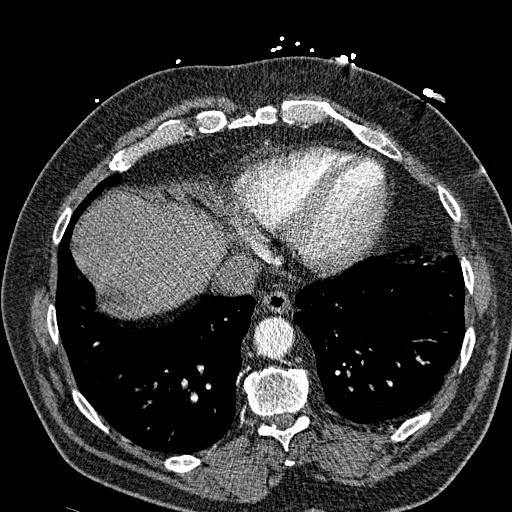
[im 88/294  lung]
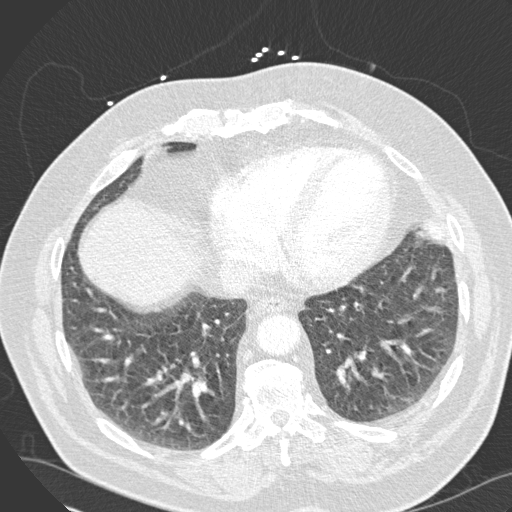
[im 103/294  soft-tissue]
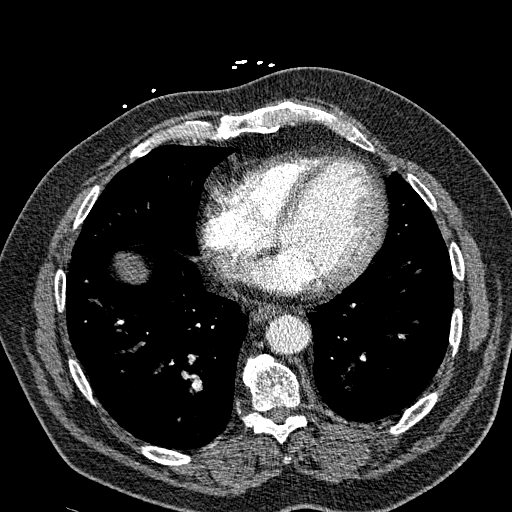
[im 132/294  lung]
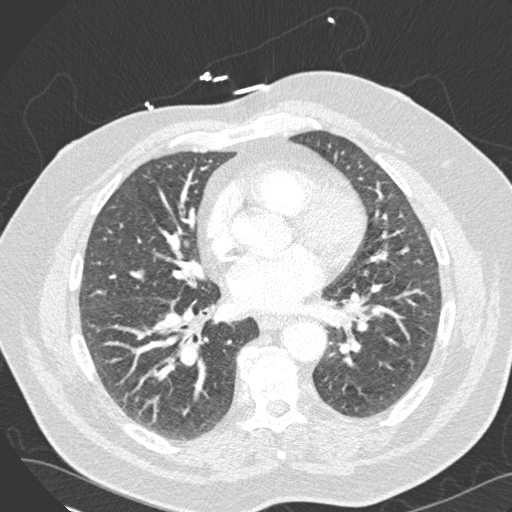
[im 147/294  soft-tissue]
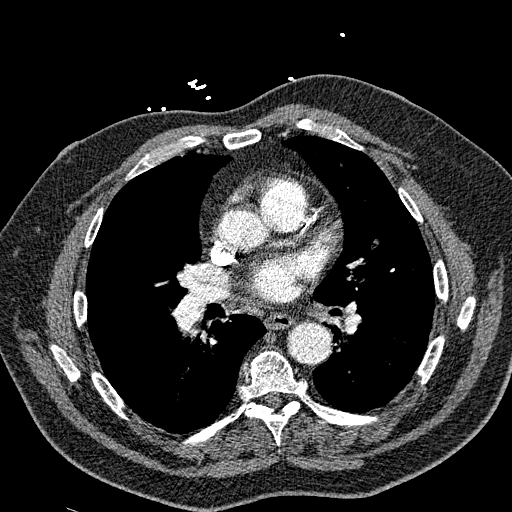
[im 162/294  lung]
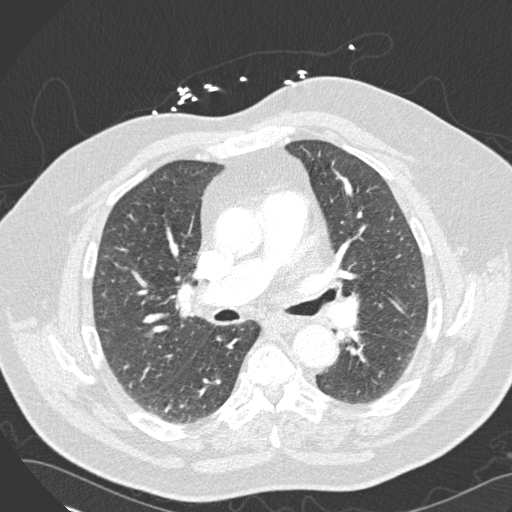
[im 191/294  soft-tissue]
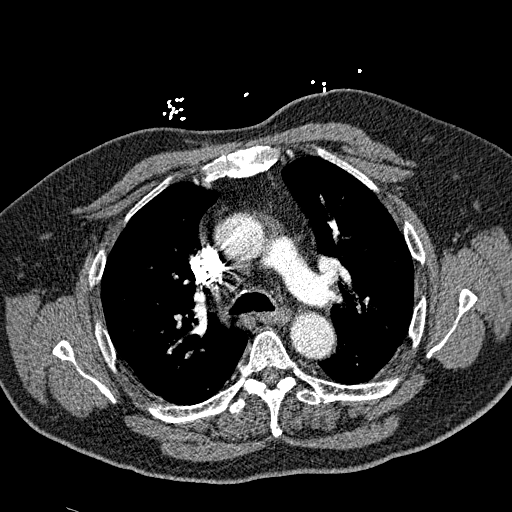
[im 206/294  lung]
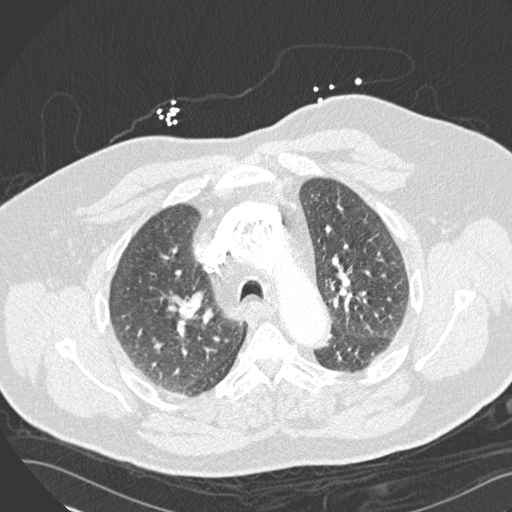
[im 220/294  soft-tissue]
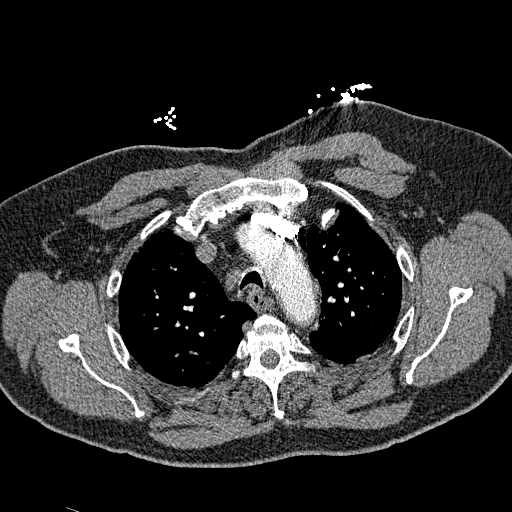
[im 235/294  lung]
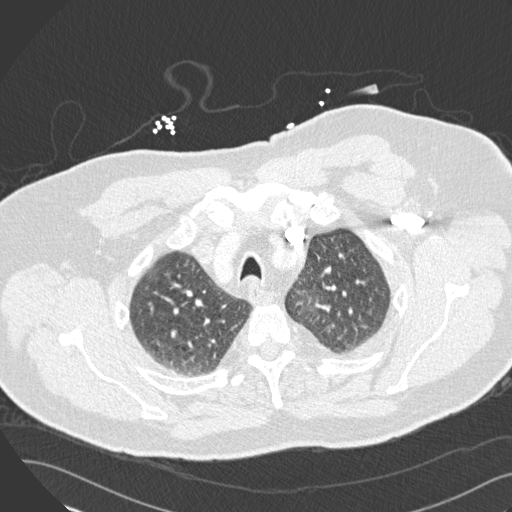
[im 264/294  soft-tissue]
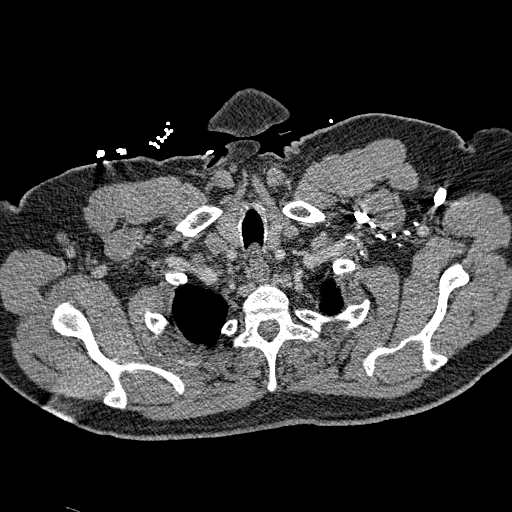
[im 279/294  lung]
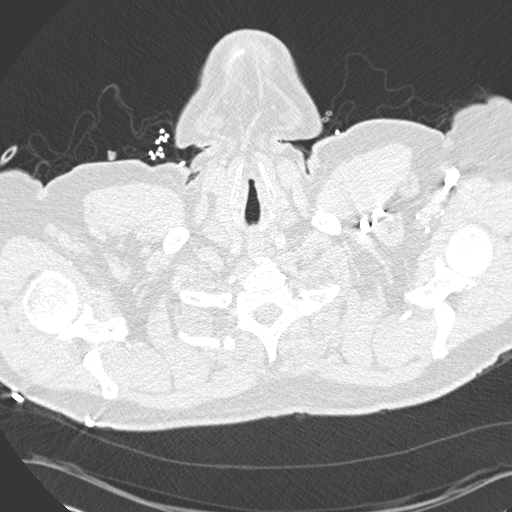

[Series 7: cor mpr 2.0 · coronal · 0.59mm/px · 3 of 134 slices shown]
[im 34/134  soft-tissue]
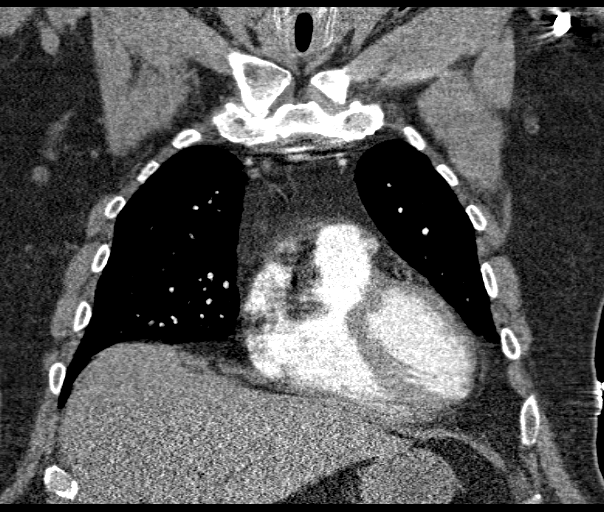
[im 67/134  soft-tissue]
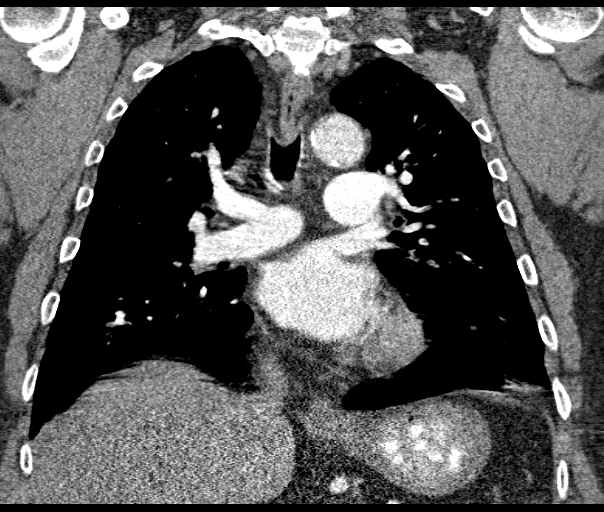
[im 100/134  soft-tissue]
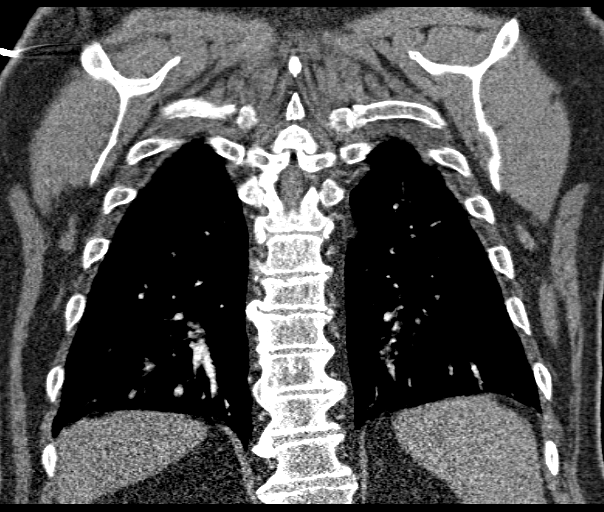

[18 of 46 positions shown; findings below may reference images not displayed]

FINDINGS: Cardiovascular: There are no filling defects within the pulmonary
arteries to suggest pulmonary embolus. Minimal atherosclerosis of
the aortic arch. No aortic dissection or aneurysm. Mild distal
aortic tortuosity. There are coronary artery calcifications.

Mediastinum/Nodes: Prominent right upper paratracheal node measures
9 mm short axis, borderline. No additional mediastinal or hilar
adenopathy. No pericardial fluid.

Lungs/Pleura: Central bronchial thickening most prominent in the
lower lobes. Minimal dependent atelectasis. Breathing motion
artifact obscures dependent left lower lobe evaluation. No
consolidation to suggest pneumonia. No pulmonary mass. No suspicious
nodule allowing for breathing motion.

Upper Abdomen: No acute abnormality.

Musculoskeletal: Multilevel degenerative change throughout the
spine. There are no acute or suspicious osseous abnormalities.

Review of the MIP images confirms the above findings.
IMPRESSION: 1. No pulmonary embolus.
2. Bronchial thickening, progressed from prior radiograph.
3. Mild aortic atherosclerosis.  Coronary artery calcifications.

## 2017-08-09 ENCOUNTER — Ambulatory Visit (HOSPITAL_COMMUNITY)
Admission: RE | Admit: 2017-08-09 | Discharge: 2017-08-09 | Disposition: A | Discharge status: Home / Self Care | Payer: Medicare Other | Source: Ambulatory Visit | Attending: Family Medicine | Admitting: Family Medicine

## 2017-08-09 ENCOUNTER — Other Ambulatory Visit (HOSPITAL_COMMUNITY): Payer: Self-pay | Admitting: Family Medicine

## 2017-08-09 DIAGNOSIS — R936 Abnormal findings on diagnostic imaging of limbs: Secondary | ICD-10-CM | POA: Diagnosis not present

## 2017-08-09 DIAGNOSIS — M7989 Other specified soft tissue disorders: Secondary | ICD-10-CM

## 2017-10-19 ENCOUNTER — Encounter: Payer: Self-pay | Admitting: Cardiology

## 2017-11-21 DIAGNOSIS — G4733 Obstructive sleep apnea (adult) (pediatric): Secondary | ICD-10-CM | POA: Insufficient documentation

## 2018-01-07 ENCOUNTER — Ambulatory Visit: Payer: Medicare Other | Admitting: Cardiology

## 2018-01-08 NOTE — Progress Notes (Signed)
Cardiology Office Note  Date: 01/10/2018   ID: Bruce Barrera, DOB 1951-01-28, MRN [id number]  PCP: Joette Catching, MD  Primary Cardiologist: Nona Dell, MD   Chief Complaint  Patient presents with  . Cardiomyopathy    History of Present Illness: Bruce Barrera is a 67 y.o. male last seen in October 2018.  He presents for a routine follow-up visit.  States that he has been taking his medications regularly since last encounter and continues to follow with Dr. Lysbeth Galas.  He does not report any angina symptoms and has stable NYHA class II dyspnea.  Denies palpitations and syncope.  I reviewed his cardiac medications which are outlined below.  His weight is up, he attributes this to diet and increased caloric intake since December.  He does have leg swelling but reports control with Maxide.  Echocardiogram from October 2018 revealed LVEF 30 to 35% with wall motion abnormalities consistent with ischemic cardiomyopathy.  We discussed obtaining a follow-up study since he reports compliance with medical therapy.  I reviewed his recent lab work from Dr. Lysbeth Galas.  He also states that he is preparing to have left eye surgery under sedation as an outpatient with Dr. Aura Camps.   Past Medical History:  Diagnosis Date  . Arthritis    Hips and knees  . Back pain   . Chronic systolic CHF (congestive heart failure) (HCC)   . Coronary artery disease    a. 03/2016 subacute anterior MI -> 100% prox LAD >72 hours out thus treated medically, LVEF 30-35% initially  . Essential hypertension   . GERD (gastroesophageal reflux disease)   . Hemorrhoids   . History of stroke    States "stroke" with left sided weakness 5/95 in prison in [place]  . Hyperlipidemia   . Hypothyroidism   . Ischemic cardiomyopathy   . MI (myocardial infarction) (HCC)    States "heart attack" 2/95 in prison in [place]  . Obesity   . Peptic ulcer    GIB 1994    Past Surgical History:  Procedure Laterality Date  . CARDIAC  CATHETERIZATION N/A 03/15/2016   Procedure: Right/Left Heart Cath and Coronary Angiography;  Surgeon: Iran Ouch, MD;  Location: [facility];  Service: Cardiovascular;  Laterality: N/A;  . COLONOSCOPY  03/23/2011   hemorrhoids  . HIP ARTHROSCOPY    . KNEE ARTHROSCOPY     Right knee    Current Outpatient Medications  Medication Sig Dispense Refill  . albuterol (PROVENTIL HFA;VENTOLIN HFA) 108 (90 Base) MCG/ACT inhaler Inhale 2 puffs into the lungs 2 (two) times daily.     Marland Kitchen aspirin 81 MG tablet Take 81 mg by mouth daily.      Marland Kitchen atorvastatin (LIPITOR) 80 MG tablet Take 1 tablet (80 mg total) by mouth daily. 30 tablet 1  . budesonide-formoterol (SYMBICORT) 160-4.5 MCG/ACT inhaler Inhale 2 puffs into the lungs 2 (two) times daily.     . carvedilol (COREG) 3.125 MG tablet TAKE ONE TABLET BY MOUTH TWICE DAILY WITH A MEAL. 180 tablet 3  . clopidogrel (PLAVIX) 75 MG tablet Take 1 tablet (75 mg total) by mouth daily. 30 tablet 3  . levothyroxine (SYNTHROID) 100 MCG tablet Take 100 mcg by mouth daily before breakfast.     . sacubitril-valsartan (ENTRESTO) 24-26 MG Take 1 tablet by mouth 2 (two) times daily. 60 tablet 2  . triamterene-hydrochlorothiazide (MAXZIDE-25) 37.5-25 MG tablet Take 1 tablet by mouth daily.      Current Facility-Administered Medications  Medication Dose  Route Frequency Provider Last Rate Last Dose  . 0.9 %  sodium chloride infusion  500 mL Intravenous Continuous Iva Boop, MD       Allergies:  Patient has no known allergies.   Social History: The patient  reports that he has been smoking cigarettes.  He started smoking about 56 years ago. He has been smoking about 1.00 pack per day. He has never used smokeless tobacco. He reports that he does not drink alcohol or use drugs.   ROS:  Please see the history of present illness. Otherwise, complete review of systems is positive for intermittent leg swelling.  All other systems are reviewed and negative.    Physical Exam: VS:  BP 110/72 (BP Location: Right Arm)   Pulse 92   Ht 5' 11.25" (1.81 m)   Wt 254 lb (115.2 kg)   SpO2 94%   BMI 35.18 kg/m , BMI Body mass index is 35.18 kg/m.  Wt Readings from Last 3 Encounters:  01/10/18 254 lb (115.2 kg)  06/25/17 237 lb (107.5 kg)  02/12/17 246 lb (111.6 kg)    General: Patient appears comfortable at rest. HEENT: Conjunctiva and lids normal, oropharynx clear with moist mucosa. Neck: Supple, no elevated JVP or carotid bruits, no thyromegaly. Lungs: Clear to auscultation, nonlabored breathing at rest. Cardiac: Regular rate and rhythm, no S3 or significant systolic murmur. Abdomen: Soft, nontender, bowel sounds present, no guarding or rebound. Extremities: No pitting edema, distal pulses 2+. Skin: Warm and dry. Musculoskeletal: No kyphosis. Neuropsychiatric: Alert and oriented x3, affect grossly appropriate.  ECG: I personally reviewed the tracing from 06/25/2017 which showed sinus rhythm with old anterior infarct pattern and nonspecific T wave changes.  Recent Labwork:  March 2019: Hemoglobin 17.6, platelets 154, BUN 16, creatinine 0.81, potassium 4.0, AST 21, ALT 19, cholesterol 123, triglycerides 86, HDL 34, LDL 72  Other Studies Reviewed Today:  Echocardiogram 06/18/2017: Study Conclusions  - Left ventricle: Septal and apical hypokinesis mid and basal   inferior and posterior lateral akinesis with area of calcified   myocardium in mid inferior wall   suggesting old infarct The cavity size was mildly dilated.   Systolic function was moderately to severely reduced. The   estimated ejection fraction was in the range of 30% to 35%. Wall   motion was normal; there were no regional wall motion   abnormalities. Left ventricular diastolic function parameters   were normal. - Atrial septum: No defect or patent foramen ovale was identified.  Assessment and Plan:  1.  Ischemic cardiomyopathy with LVEF 30 to 35%.  Patient reports  compliance with medications, list was updated.  We plan to obtain a follow-up echocardiogram to reassess LVEF.  Might be worth considering taking him off Maxide was switched to Aldactone the next step.  We have held off considering ICD so far.  2.  Preoperative evaluation prior to planned left eye surgery under sedation as an outpatient with Dr. Karleen Hampshire in [place].  He should be able to proceed at overall intermediate perioperative cardiac risk.  3.  Known CAD with occluded LAD associated with subacute anterior infarct in July 2017.  He does not report any active angina at this time.  4.  Ongoing tobacco abuse.  Smoking cessation has been discussed.   Current medicines were reviewed with the patient today.   Orders Placed This Encounter  Procedures  . ECHOCARDIOGRAM COMPLETE    Disposition: Follow-up in 6 months.  Signed, Jonelle Sidle, MD, [facility] 01/10/2018 1:18 PM  [facility] [address], [place], [place] [address] Phone: [phone]; Fax: [phone]

## 2018-01-10 ENCOUNTER — Ambulatory Visit (INDEPENDENT_AMBULATORY_CARE_PROVIDER_SITE_OTHER): Payer: Medicare Other | Admitting: Cardiology

## 2018-01-10 ENCOUNTER — Encounter: Payer: Self-pay | Admitting: Cardiology

## 2018-01-10 VITALS — BP 110/72 | HR 92 | Ht 71.25 in | Wt 254.0 lb

## 2018-01-10 DIAGNOSIS — I255 Ischemic cardiomyopathy: Secondary | ICD-10-CM

## 2018-01-10 DIAGNOSIS — Z01810 Encounter for preprocedural cardiovascular examination: Secondary | ICD-10-CM

## 2018-01-10 DIAGNOSIS — I2510 Atherosclerotic heart disease of native coronary artery without angina pectoris: Secondary | ICD-10-CM

## 2018-01-10 DIAGNOSIS — Z720 Tobacco use: Secondary | ICD-10-CM | POA: Diagnosis not present

## 2018-01-10 NOTE — Patient Instructions (Addendum)
Your physician wants you to follow-up in: 6 months with Dr.McDowell You will receive a reminder letter in the mail two months in advance. If you don't receive a letter, please call our office to schedule the follow-up appointment.    Your physician recommends that you continue on your current medications as directed. Please refer to the Current Medication list given to you today.    If you need a refill on your cardiac medications before your next appointment, please call your pharmacy.    Your physician has requested that you have an echocardiogram. Echocardiography is a painless test that uses sound waves to create images of your heart. It provides your doctor with information about the size and shape of your heart and how well your heart's chambers and valves are working. This procedure takes approximately one hour. There are no restrictions for this procedure.      No lab work ordered today      Thank you for choosing [facility] !

## 2018-01-17 ENCOUNTER — Ambulatory Visit (HOSPITAL_COMMUNITY)
Admission: RE | Admit: 2018-01-17 | Discharge: 2018-01-17 | Disposition: A | Discharge status: Home / Self Care | Payer: Medicare Other | Source: Ambulatory Visit | Attending: Cardiology | Admitting: Cardiology

## 2018-01-17 DIAGNOSIS — I119 Hypertensive heart disease without heart failure: Secondary | ICD-10-CM | POA: Insufficient documentation

## 2018-01-17 DIAGNOSIS — I080 Rheumatic disorders of both mitral and aortic valves: Secondary | ICD-10-CM | POA: Insufficient documentation

## 2018-01-17 DIAGNOSIS — Z6835 Body mass index (BMI) 35.0-35.9, adult: Secondary | ICD-10-CM | POA: Diagnosis not present

## 2018-01-17 DIAGNOSIS — E782 Mixed hyperlipidemia: Secondary | ICD-10-CM | POA: Insufficient documentation

## 2018-01-17 DIAGNOSIS — I252 Old myocardial infarction: Secondary | ICD-10-CM | POA: Diagnosis not present

## 2018-01-17 DIAGNOSIS — I255 Ischemic cardiomyopathy: Secondary | ICD-10-CM | POA: Insufficient documentation

## 2018-01-17 DIAGNOSIS — J449 Chronic obstructive pulmonary disease, unspecified: Secondary | ICD-10-CM | POA: Diagnosis not present

## 2018-01-17 DIAGNOSIS — Z720 Tobacco use: Secondary | ICD-10-CM | POA: Insufficient documentation

## 2018-01-17 NOTE — Progress Notes (Signed)
*  PRELIMINARY RESULTS* Echocardiogram 2D Echocardiogram has been performed.  Stacey Drain 01/17/2018, 3:03 PM

## 2018-03-06 ENCOUNTER — Ambulatory Visit (HOSPITAL_COMMUNITY): Admission: RE | Admit: 2018-03-06 | Payer: Medicare Other | Source: Ambulatory Visit | Admitting: Ophthalmology

## 2018-03-06 ENCOUNTER — Encounter (HOSPITAL_COMMUNITY): Admission: RE | Payer: Self-pay | Source: Ambulatory Visit

## 2018-03-06 SURGERY — MUSCLE RECESSION/RESECTION
Anesthesia: General | Laterality: Left

## 2018-03-09 ENCOUNTER — Other Ambulatory Visit: Payer: Self-pay | Admitting: Cardiology

## 2018-04-06 ENCOUNTER — Encounter (HOSPITAL_COMMUNITY): Payer: Self-pay | Admitting: *Deleted

## 2018-04-06 NOTE — Progress Notes (Signed)
Cardiologist Dr. Diona Barrera. States he can walk 30- 40 feet before getting shob which is his normal. Denies chest pain. I requested that he contact Dr. Karleen Barrera regarding Plavix and ASA. Requested that anesthesia review cardiac history.

## 2018-04-07 DIAGNOSIS — H532 Diplopia: Secondary | ICD-10-CM

## 2018-04-07 DIAGNOSIS — H509 Unspecified strabismus: Secondary | ICD-10-CM

## 2018-04-07 NOTE — H&P (Addendum)
Bruce Barrera is an 67 y.o. male.   Chief Complaint: Longstanding monocular exotropia of the left eye . HPI: 67 y/o WM c CAD and chronic monocular exotropia OS presents for elective repair of strabismus under general anesthesia.  Past Medical History:  Diagnosis Date  . Arthritis    Hips and knees  . Back pain   . Chronic systolic CHF (congestive heart failure) (HCC)   . Coronary artery disease    a. 03/2016 subacute anterior MI -> 100% prox LAD >72 hours out thus treated medically, LVEF 30-35% initially  . Essential hypertension   . GERD (gastroesophageal reflux disease)   . Hemorrhoids   . History of stroke    States "stroke" with left sided weakness 5/95 in prison in [place]  . Hyperlipidemia   . Hypothyroidism   . Ischemic cardiomyopathy   . MI (myocardial infarction) (HCC)    States "heart attack" 2/95 in prison in [place]  . Obesity   . Peptic ulcer    GIB 1994  . Sleep apnea     Past Surgical History:  Procedure Laterality Date  . CARDIAC CATHETERIZATION N/A 03/15/2016   Procedure: Right/Left Heart Cath and Coronary Angiography;  Surgeon: Iran OuchMuhammad A Arida, MD;  [facility]: [facility];  Service: Cardiovascular;  Laterality: N/A;  . COLONOSCOPY  03/23/2011   hemorrhoids  . KNEE ARTHROSCOPY     Right knee x 2  . TOTAL HIP ARTHROPLASTY Right 2014    Family History  Problem Relation Age of Onset  . Hypertension Mother   . Diabetes Mother   . Heart disease Mother   . Stroke Mother   . Stroke Father   . Heart attack Brother   . Aneurysm Brother   . Cirrhosis Brother    Social History:  reports that he has been smoking cigarettes.  He started smoking about 56 years ago. He has been smoking about 2.00 packs per day. He has never used smokeless tobacco. He reports that he does not drink alcohol or use drugs.  Allergies: No Known Allergies  No medications prior to admission.    No results found for this or any previous visit (from the past 48 hour(s)). No results  found.  Review of Systems  Constitutional: Negative.   HENT: Negative.   Eyes: Positive for blurred vision and double vision.       Exotropia  Respiratory: Negative.   Cardiovascular:       Hx of CAD   Gastrointestinal: Negative.   Genitourinary: Negative.   Skin: Negative.   Neurological: Negative.   Psychiatric/Behavioral: Negative.     There were no vitals taken for this visit. Physical Exam  Constitutional: He appears well-developed and well-nourished.  HENT:  Head: Normocephalic and atraumatic.  Eyes: Pupils are equal, round, and reactive to light.    Neck: Normal range of motion.  Neurological: He is alert.     Assessment/Plan Schedule for EOMS : LMR resection:  LLR recession AS placement @ LMR.  Aura CampsMichael Franny Selvage, MD 04/07/2018, 9:06 PM

## 2018-04-08 NOTE — Progress Notes (Signed)
Anesthesia Chart Review: Same Day Workup   Case:  [id number] Date/Time:  04/10/18 0900   Procedures:      LEFT MEDIAN RECTUS REPAIR (Left )     LEFT MUSCLE RECESSION (Left )   Anesthesia type:  General   Pre-op diagnosis:  EXOTROPIA LEFT EYE   Location:  [facility] ROOM 08 / [facility]   Surgeon:  Aura CampsSpencer, Michael, MD      DISCUSSION: 67 yo male smoker for above procedure. Pertinent Hx includes GERD, Arthritis, PUD, Ischemic cardiomyopathy, Hypothyroid, Stoke w/ residual L hemiparesis, Obesity, HTN, HLD, CAD (03/2016 subacute anterior MI -> 100% prox LAD >72 hours out thus treated medically, LVEF 30-35% initially).  Pt was seen preoperatively by cardiology Dr. [name] 01/10/2018. Per his note from that visit "Known CAD with occluded LAD associated with subacute anterior infarct in July 2017.  He does not report any active angina at this time. Preoperative evaluation prior to planned left eye surgery under sedation as an outpatient with [place].  He should be able to proceed at overall intermediate perioperative cardiac risk."  At most recent cardiology visit a new Echo was ordered, performed on 01/17/2018. Results show EF of 55-60% with no wall motion abnormalities. There is grade 1 diastolic dysfunction. This is an improvement since his previous Echo from 2018 which showed EF 30-35%.  Per PAT nurse notes the pt was to discuss Plavix and ASA with Dr. Karleen HampshireSpencer.  Anticipate he can proceed with surgery as planned barring acute status change.  VS: There were no vitals taken for this visit.  PROVIDERS: Joette CatchingNyland, Leonard, MD  Nona DellMcDowell, Samuel, MD is Cardiologist las seen 01/10/2018  LABS: Will need labs DOS  Labs Reviewed - No data to display   IMAGES: CT Angio Chest PE 03/13/2016 IMPRESSION: 1. No pulmonary embolus. 2. Bronchial thickening, progressed from prior radiograph. 3. Mild aortic atherosclerosis.  Coronary artery calcifications.   EKG: 06/25/2017: Sinus  Rhythm. Old anterior  infarct. Nonspecific T-abnormality.   CV: Echo 01/17/2018: Study Conclusions  - Left ventricle: The cavity size was normal. Wall thickness was   increased in a pattern of mild LVH. Systolic function was normal.   The estimated ejection fraction was in the range of 55% to 60%.   Wall motion was normal; there were no regional wall motion   abnormalities. Doppler parameters are consistent with abnormal   left ventricular relaxation (grade 1 diastolic dysfunction). - Aortic valve: Mildly calcified annulus. Trileaflet; normal   thickness leaflets. Valve area (VTI): 2.7 cm^2. Valve area   (Vmax): 2.81 cm^2. Valve area (Vmean): 2.45 cm^2. - Mitral valve: Mildly calcified annulus. Normal thickness leaflets   . - Atrial septum: No defect or patent foramen ovale was identified.  Echocardiogram 06/18/2017: Study Conclusions  - Left ventricle: Septal and apical hypokinesis mid and basal inferior and posterior lateral akinesis with area of calcified myocardium in mid inferior wall suggesting old infarct The cavity size was mildly dilated. Systolic function was moderately to severely reduced. The estimated ejection fraction was in the range of 30% to 35%. Wall motion was normal; there were no regional wall motion abnormalities. Left ventricular diastolic function parameters were normal. - Atrial septum: No defect or patent foramen ovale was identified.  R and L Cath 03/15/2016  Prox LAD lesion, 100% stenosed.  Ost RCA lesion, 20% stenosed.  There is moderate to severe left ventricular systolic dysfunction.   1. Mildly elevated filling pressures with mild pulmonary hypertension. Normal cardiac output. Mean pulmonary  capillary wedge pressure was 18 mmHg. 2. Severe one-vessel coronary artery disease with an occluded proximal LAD after giving a large diagonal branch which supplies a large territory of the anterolateral wall. Otherwise no obstructive disease. Left dominant  system. Ostial RCA spasm improved with nitroglycerin. Very faint right to left and left to left collaterals to the LAD. 3. Moderately reduced LV systolic function with an ejection fraction of 35%. Akinesis of mid to distal anterior and apical wall.  Recommendations: Occluded proximal LAD of more than 72 hours of duration. With the lack of current anginal symptoms, there is no indication for revascularization. I recommend continuing medical therapy for coronary artery disease and ischemic cardiomyopathy. LAD PCI can be considered in the future for refractory anginal symptoms if there is anterior viability.   Past Medical History:  Diagnosis Date  . Arthritis    Hips and knees  . Back pain   . Chronic systolic CHF (congestive heart failure) (HCC)   . Coronary artery disease    a. 03/2016 subacute anterior MI -> 100% prox LAD >72 hours out thus treated medically, LVEF 30-35% initially  . Essential hypertension   . GERD (gastroesophageal reflux disease)   . Hemorrhoids   . History of stroke    States "stroke" with left sided weakness 5/95 in prison in [place]  . Hyperlipidemia   . Hypothyroidism   . Ischemic cardiomyopathy   . MI (myocardial infarction) (HCC)    States "heart attack" 2/95 in prison in [place]  . Obesity   . Peptic ulcer    GIB 1994  . Sleep apnea     Past Surgical History:  Procedure Laterality Date  . CARDIAC CATHETERIZATION N/A 03/15/2016   Procedure: Right/Left Heart Cath and Coronary Angiography;  Surgeon: Iran Ouch, MD;  [facility];  Service: Cardiovascular;  Laterality: N/A;  . COLONOSCOPY  03/23/2011   hemorrhoids  . KNEE ARTHROSCOPY     Right knee x 2  . TOTAL HIP ARTHROPLASTY Right 2014    MEDICATIONS: . 0.9 %  sodium chloride infusion   . aspirin 81 MG tablet  . atorvastatin (LIPITOR) 80 MG tablet  . carvedilol (COREG) 3.125 MG tablet  . clopidogrel (PLAVIX) 75 MG tablet  . levothyroxine (SYNTHROID) 100 MCG tablet  . pantoprazole  (PROTONIX) 40 MG tablet  . sacubitril-valsartan (ENTRESTO) 24-26 MG  . triamterene-hydrochlorothiazide (MAXZIDE-25) 37.5-25 MG tablet      [facility] Short Stay Center/Anesthesiology Phone [phone] 04/08/2018 9:22 AM

## 2018-04-10 ENCOUNTER — Encounter (HOSPITAL_COMMUNITY)
Admission: RE | Disposition: A | Discharge status: Home / Self Care | Payer: Self-pay | Source: Ambulatory Visit | Attending: Ophthalmology

## 2018-04-10 ENCOUNTER — Encounter (HOSPITAL_COMMUNITY): Payer: Self-pay | Admitting: *Deleted

## 2018-04-10 ENCOUNTER — Ambulatory Visit (HOSPITAL_COMMUNITY): Payer: Medicare Other | Admitting: Physician Assistant

## 2018-04-10 ENCOUNTER — Ambulatory Visit (HOSPITAL_COMMUNITY)
Admission: RE | Admit: 2018-04-10 | Discharge: 2018-04-10 | Disposition: A | Discharge status: Home / Self Care | Payer: Medicare Other | Source: Ambulatory Visit | Attending: Ophthalmology | Admitting: Ophthalmology

## 2018-04-10 ENCOUNTER — Other Ambulatory Visit: Payer: Self-pay

## 2018-04-10 DIAGNOSIS — Z79899 Other long term (current) drug therapy: Secondary | ICD-10-CM | POA: Diagnosis not present

## 2018-04-10 DIAGNOSIS — K219 Gastro-esophageal reflux disease without esophagitis: Secondary | ICD-10-CM | POA: Diagnosis not present

## 2018-04-10 DIAGNOSIS — I5022 Chronic systolic (congestive) heart failure: Secondary | ICD-10-CM | POA: Diagnosis not present

## 2018-04-10 DIAGNOSIS — H50112 Monocular exotropia, left eye: Secondary | ICD-10-CM | POA: Diagnosis not present

## 2018-04-10 DIAGNOSIS — H5010 Unspecified exotropia: Secondary | ICD-10-CM | POA: Diagnosis present

## 2018-04-10 DIAGNOSIS — Z8711 Personal history of peptic ulcer disease: Secondary | ICD-10-CM | POA: Insufficient documentation

## 2018-04-10 DIAGNOSIS — H532 Diplopia: Secondary | ICD-10-CM

## 2018-04-10 DIAGNOSIS — G4730 Sleep apnea, unspecified: Secondary | ICD-10-CM | POA: Diagnosis not present

## 2018-04-10 DIAGNOSIS — F17210 Nicotine dependence, cigarettes, uncomplicated: Secondary | ICD-10-CM | POA: Insufficient documentation

## 2018-04-10 DIAGNOSIS — I110 Hypertensive heart disease with heart failure: Secondary | ICD-10-CM | POA: Diagnosis not present

## 2018-04-10 DIAGNOSIS — I2510 Atherosclerotic heart disease of native coronary artery without angina pectoris: Secondary | ICD-10-CM | POA: Diagnosis not present

## 2018-04-10 DIAGNOSIS — I252 Old myocardial infarction: Secondary | ICD-10-CM | POA: Diagnosis not present

## 2018-04-10 DIAGNOSIS — H509 Unspecified strabismus: Secondary | ICD-10-CM

## 2018-04-10 HISTORY — PX: MEDIAN RECTUS REPAIR: SHX5301

## 2018-04-10 HISTORY — PX: MUSCLE RECESSION AND RESECTION: SHX5209

## 2018-04-10 HISTORY — DX: Sleep apnea, unspecified: G47.30

## 2018-04-10 HISTORY — PX: ADJUSTABLE SUTURE MANIPULATION: SHX5290

## 2018-04-10 LAB — CBC
HCT: 55.4 % — ABNORMAL HIGH (ref 39.0–52.0)
Hemoglobin: 18.3 g/dL — ABNORMAL HIGH (ref 13.0–17.0)
MCH: 32.5 pg (ref 26.0–34.0)
MCHC: 33 g/dL (ref 30.0–36.0)
MCV: 98.4 fL (ref 78.0–100.0)
Platelets: 159 10*3/uL (ref 150–400)
RBC: 5.63 MIL/uL (ref 4.22–5.81)
RDW: 13.5 % (ref 11.5–15.5)
WBC: 9.4 10*3/uL (ref 4.0–10.5)

## 2018-04-10 LAB — BASIC METABOLIC PANEL
Anion gap: 8 (ref 5–15)
BUN: 12 mg/dL (ref 8–23)
CO2: 25 mmol/L (ref 22–32)
Calcium: 9.2 mg/dL (ref 8.9–10.3)
Chloride: 107 mmol/L (ref 98–111)
Creatinine, Ser: 0.91 mg/dL (ref 0.61–1.24)
GFR calc Af Amer: >60 mL/min (ref >60)
GFR calc non Af Amer: >60 mL/min (ref >60)
Glucose, Bld: 121 mg/dL — ABNORMAL HIGH (ref 70–99)
Potassium: 3.7 mmol/L (ref 3.5–5.1)
Sodium: 140 mmol/L (ref 135–145)

## 2018-04-10 LAB — PROTIME-INR
INR: 1
Prothrombin Time: 13.1 seconds (ref 11.4–15.2)

## 2018-04-10 SURGERY — ADJUSTABLE SUTURE MANIPULATION
Anesthesia: General | Laterality: Left

## 2018-04-10 SURGERY — REPAIR, MUSCLE, MEDIAL RECTUS
Anesthesia: General | Site: Eye | Laterality: Left

## 2018-04-10 MED ORDER — FENTANYL CITRATE (PF) 100 MCG/2ML IJ SOLN
INTRAMUSCULAR | Status: DC | PRN
  Administered 2018-04-10: 25 ug via INTRAVENOUS
  Administered 2018-04-10 (×3): 50 ug via INTRAVENOUS
  Administered 2018-04-10: 25 ug via INTRAVENOUS

## 2018-04-10 MED ORDER — PROPOFOL 10 MG/ML IV BOLUS
INTRAVENOUS | Status: DC | PRN
  Administered 2018-04-10: 140 mg via INTRAVENOUS

## 2018-04-10 MED ORDER — ONDANSETRON HCL 4 MG/2ML IJ SOLN
INTRAMUSCULAR | Status: DC | PRN
  Administered 2018-04-10: 4 mg via INTRAVENOUS

## 2018-04-10 MED ORDER — GLYCOPYRROLATE 0.2 MG/ML IJ SOLN
INTRAMUSCULAR | Status: DC | PRN
  Administered 2018-04-10: 0.2 mg via INTRAVENOUS

## 2018-04-10 MED ORDER — EPHEDRINE SULFATE 50 MG/ML IJ SOLN
INTRAMUSCULAR | Status: DC | PRN
  Administered 2018-04-10: 15 mg via INTRAVENOUS
  Administered 2018-04-10: 5 mg via INTRAVENOUS
  Administered 2018-04-10: 15 mg via INTRAVENOUS

## 2018-04-10 MED ORDER — ONDANSETRON HCL 4 MG/2ML IJ SOLN
INTRAMUSCULAR | Status: AC
  Filled 2018-04-10: qty 2

## 2018-04-10 MED ORDER — ALBUTEROL SULFATE HFA 108 (90 BASE) MCG/ACT IN AERS
INHALATION_SPRAY | RESPIRATORY_TRACT | Status: AC
  Filled 2018-04-10: qty 6.7

## 2018-04-10 MED ORDER — 0.9 % SODIUM CHLORIDE (POUR BTL) OPTIME
TOPICAL | Status: DC | PRN
  Administered 2018-04-10: 1000 mL

## 2018-04-10 MED ORDER — HYDROMORPHONE HCL 1 MG/ML IJ SOLN: 0.2500 mg | INTRAMUSCULAR | Status: DC | PRN

## 2018-04-10 MED ORDER — ROCURONIUM BROMIDE 100 MG/10ML IV SOLN
INTRAVENOUS | Status: DC | PRN
  Administered 2018-04-10: 50 mg via INTRAVENOUS

## 2018-04-10 MED ORDER — SODIUM CHLORIDE 0.9 % IJ SOLN
INTRAMUSCULAR | Status: AC
  Filled 2018-04-10: qty 10

## 2018-04-10 MED ORDER — LIDOCAINE 2% (20 MG/ML) 5 ML SYRINGE
INTRAMUSCULAR | Status: AC
  Filled 2018-04-10: qty 10

## 2018-04-10 MED ORDER — PHENYLEPHRINE HCL 2.5 % OP SOLN
OPHTHALMIC | Status: DC | PRN
  Administered 2018-04-10: 3 [drp] via OPHTHALMIC

## 2018-04-10 MED ORDER — EPHEDRINE SULFATE 50 MG/ML IJ SOLN
INTRAMUSCULAR | Status: AC
Start: 2018-04-10 — End: ?
  Filled 2018-04-10: qty 1

## 2018-04-10 MED ORDER — BSS IO SOLN
INTRAOCULAR | Status: DC | PRN
  Administered 2018-04-10: 30 mL via INTRAOCULAR

## 2018-04-10 MED ORDER — PHENYLEPHRINE 40 MCG/ML (10ML) SYRINGE FOR IV PUSH (FOR BLOOD PRESSURE SUPPORT)
PREFILLED_SYRINGE | INTRAVENOUS | Status: AC
  Filled 2018-04-10: qty 10

## 2018-04-10 MED ORDER — TOBRAMYCIN 0.3 % OP OINT
TOPICAL_OINTMENT | OPHTHALMIC | Status: DC | PRN
  Administered 2018-04-10: 1 via OPHTHALMIC

## 2018-04-10 MED ORDER — ALBUTEROL SULFATE HFA 108 (90 BASE) MCG/ACT IN AERS
INHALATION_SPRAY | RESPIRATORY_TRACT | Status: DC | PRN
  Administered 2018-04-10: 4 via RESPIRATORY_TRACT

## 2018-04-10 MED ORDER — PROPOFOL 10 MG/ML IV BOLUS
INTRAVENOUS | Status: AC
  Filled 2018-04-10: qty 20

## 2018-04-10 MED ORDER — ROCURONIUM BROMIDE 10 MG/ML (PF) SYRINGE
PREFILLED_SYRINGE | INTRAVENOUS | Status: AC
  Filled 2018-04-10: qty 10

## 2018-04-10 MED ORDER — TOBRAMYCIN-DEXAMETHASONE 0.3-0.1 % OP OINT: 1.0000 "application " | TOPICAL_OINTMENT | Freq: Two times a day (BID) | OPHTHALMIC | 0 refills | Status: DC

## 2018-04-10 MED ORDER — LIDOCAINE HCL (CARDIAC) PF 100 MG/5ML IV SOSY
PREFILLED_SYRINGE | INTRAVENOUS | Status: DC | PRN
  Administered 2018-04-10: 60 mg via INTRAVENOUS
  Administered 2018-04-10: 40 mg via INTRATRACHEAL

## 2018-04-10 MED ORDER — PHENYLEPHRINE HCL 10 MG/ML IJ SOLN
INTRAMUSCULAR | Status: DC | PRN
  Administered 2018-04-10: 80 ug via INTRAVENOUS
  Administered 2018-04-10 (×2): 120 ug via INTRAVENOUS
  Administered 2018-04-10 (×2): 80 ug via INTRAVENOUS

## 2018-04-10 MED ORDER — STERILE WATER FOR IRRIGATION IR SOLN
Status: DC | PRN
  Administered 2018-04-10: 1000 mL

## 2018-04-10 MED ORDER — ESMOLOL HCL 100 MG/10ML IV SOLN
INTRAVENOUS | Status: DC | PRN
  Administered 2018-04-10: 10 mg via INTRAVENOUS

## 2018-04-10 MED ORDER — TETRACAINE HCL 0.5 % OP SOLN
OPHTHALMIC | Status: DC | PRN
  Administered 2018-04-10: 1 [drp] via OPHTHALMIC

## 2018-04-10 MED ORDER — DEXAMETHASONE SODIUM PHOSPHATE 10 MG/ML IJ SOLN
INTRAMUSCULAR | Status: DC | PRN
  Administered 2018-04-10: 10 mg via INTRAVENOUS

## 2018-04-10 MED ORDER — SUCCINYLCHOLINE CHLORIDE 200 MG/10ML IV SOSY
PREFILLED_SYRINGE | INTRAVENOUS | Status: AC
  Filled 2018-04-10: qty 10

## 2018-04-10 MED ORDER — TOBRAMYCIN-DEXAMETHASONE 0.3-0.1 % OP OINT
TOPICAL_OINTMENT | OPHTHALMIC | Status: AC
  Filled 2018-04-10: qty 3.5

## 2018-04-10 MED ORDER — LACTATED RINGERS IV SOLN
INTRAVENOUS | Status: DC
  Administered 2018-04-10 (×2): via INTRAVENOUS

## 2018-04-10 MED ORDER — HYPROMELLOSE (GONIOSCOPIC) 2.5 % OP SOLN
OPHTHALMIC | Status: AC
  Filled 2018-04-10: qty 15

## 2018-04-10 MED ORDER — DEXAMETHASONE SODIUM PHOSPHATE 10 MG/ML IJ SOLN
INTRAMUSCULAR | Status: AC
  Filled 2018-04-10: qty 1

## 2018-04-10 MED ORDER — FENTANYL CITRATE (PF) 250 MCG/5ML IJ SOLN
INTRAMUSCULAR | Status: AC
  Filled 2018-04-10: qty 5

## 2018-04-10 MED ORDER — METOPROLOL TARTRATE 5 MG/5ML IV SOLN
INTRAVENOUS | Status: AC
  Filled 2018-04-10: qty 5

## 2018-04-10 MED ORDER — MIDAZOLAM HCL 2 MG/2ML IJ SOLN
INTRAMUSCULAR | Status: AC
  Filled 2018-04-10: qty 2

## 2018-04-10 MED ORDER — METOPROLOL TARTRATE 5 MG/5ML IV SOLN
INTRAVENOUS | Status: DC | PRN
  Administered 2018-04-10: 2 mg via INTRAVENOUS
  Administered 2018-04-10: 3 mg via INTRAVENOUS

## 2018-04-10 MED ORDER — SUGAMMADEX SODIUM 200 MG/2ML IV SOLN
INTRAVENOUS | Status: DC | PRN
  Administered 2018-04-10: 200 mg via INTRAVENOUS

## 2018-04-10 MED ORDER — PHENYLEPHRINE HCL 2.5 % OP SOLN
OPHTHALMIC | Status: AC
  Filled 2018-04-10: qty 2

## 2018-04-10 SURGICAL SUPPLY — 33 items
APPLICATOR DR MATTHEWS STRL (MISCELLANEOUS) ×2 IMPLANT
BLADE 10 SAFETY STRL DISP (BLADE) ×2 IMPLANT
BLADE SURG 15 STRL LF DISP TIS (BLADE) IMPLANT
BLADE SURG 15 STRL SS (BLADE)
BNDG CONFORM 3 STRL LF (GAUZE/BANDAGES/DRESSINGS) IMPLANT
CANNULA ANT/CHMB 27GA (MISCELLANEOUS) ×2 IMPLANT
CAUTERY EYE LOW TEMP 1300F FIN (OPHTHALMIC RELATED) ×4 IMPLANT
CORDS BIPOLAR (ELECTRODE) IMPLANT
COVER MAYO STAND STRL (DRAPES) ×2 IMPLANT
COVER SURGICAL LIGHT HANDLE (MISCELLANEOUS) ×2 IMPLANT
CRADLE DONUT ADULT HEAD (MISCELLANEOUS) ×2 IMPLANT
DRAPE SURG 17X23 STRL (DRAPES) ×6 IMPLANT
DRSG TEGADERM 2-3/8X2-3/4 SM (GAUZE/BANDAGES/DRESSINGS) ×2 IMPLANT
GAUZE SPONGE 4X4 12PLY STRL (GAUZE/BANDAGES/DRESSINGS) ×2 IMPLANT
GLOVE BIO SURGEON STRL SZ 6.5 (GLOVE) ×2 IMPLANT
GLOVE ECLIPSE 6.5 STRL STRAW (GLOVE) ×2 IMPLANT
GLOVE SURG SIGNA 7.5 PF LTX (GLOVE) ×2 IMPLANT
GOWN STRL REUS W/ TWL LRG LVL3 (GOWN DISPOSABLE) ×2 IMPLANT
GOWN STRL REUS W/TWL LRG LVL3 (GOWN DISPOSABLE) ×2
KIT TURNOVER KIT B (KITS) ×2 IMPLANT
MARKER SKIN DUAL TIP RULER LAB (MISCELLANEOUS) ×2 IMPLANT
NEEDLE PRECISIONGLIDE 27X1.5 (NEEDLE) ×2 IMPLANT
NS IRRIG 1000ML POUR BTL (IV SOLUTION) ×2 IMPLANT
PACK CATARACT CUSTOM (CUSTOM PROCEDURE TRAY) ×2 IMPLANT
PAD ARMBOARD 7.5X6 YLW CONV (MISCELLANEOUS) ×4 IMPLANT
STRIP CLOSURE SKIN 1/2X4 (GAUZE/BANDAGES/DRESSINGS) ×2 IMPLANT
SUT SILK 4 0 RB 1 (SUTURE) ×2 IMPLANT
SUT VICRYL 6 0 S 29 12 (SUTURE) ×2 IMPLANT
SUT VICRYL 7 0 TG140 8 (SUTURE) IMPLANT
SUT VICRYL 8 0 TG140 8 (SUTURE) ×2 IMPLANT
SUT VICRYL 9-0 (SUTURE) ×2 IMPLANT
TOWEL GREEN STERILE FF (TOWEL DISPOSABLE) ×2 IMPLANT
WIPE INSTRUMENT VISIWIPE 73X73 (MISCELLANEOUS) ×2 IMPLANT

## 2018-04-10 NOTE — Interval H&P Note (Signed)
History and Physical Interval Note:  04/10/2018 10:13 AM  Bruce Barrera  has presented today for surgery, with the diagnosis of EXOTROPIA LEFT EYE  The various methods of treatment have been discussed with the patient and family. After consideration of risks, benefits and other options for treatment, the patient has consented to  Procedure(s): LEFT MEDIAN RECTUS REPAIR (Left) LEFT MUSCLE RECESSION (Left) ADJUSTABLE SUTURE MANIPULATION (Left) as a surgical intervention .  The patient's history has been reviewed, patient examined, no change in status, stable for surgery.  I have reviewed the patient's chart and labs.  Questions were answered to the patient's satisfaction.     Aura CampsMichael Linn Goetze

## 2018-04-10 NOTE — Transfer of Care (Signed)
Immediate Anesthesia Transfer of Care Note  Patient: Bruce Barrera  Procedure(s) Performed: LEFT MEDIAN RECTUS REPAIR (Left Eye) LEFT MUSCLE RECESSION (Left Eye) ADJUSTABLE SUTURE MANIPULATION (Left Eye)  Patient Location: PACU  Anesthesia Type:General  Level of Consciousness: awake, alert , oriented and patient cooperative  Airway & Oxygen Therapy: Patient Spontanous Breathing and Patient connected to nasal cannula oxygen  Post-op Assessment: Report given to RN and Post -op Vital signs reviewed and stable  Post vital signs: Reviewed and stable  Last Vitals:  Vitals Value Taken Time  BP 93/70 04/10/2018 12:46 PM  Temp    Pulse 93 04/10/2018 12:49 PM  Resp 19 04/10/2018 12:49 PM  SpO2 89 % 04/10/2018 12:49 PM  Vitals shown include unvalidated device data.  Last Pain:  Vitals:   04/10/18 0727  TempSrc:   PainSc: 0-No pain         Complications: No apparent anesthesia complications

## 2018-04-10 NOTE — Anesthesia Procedure Notes (Signed)
Procedure Name: Intubation Date/Time: 04/10/2018 10:28 AM Performed by: Shirlyn Goltz, CRNA Pre-anesthesia Checklist: Patient identified, Emergency Drugs available, Suction available and Patient being monitored Patient Re-evaluated:Patient Re-evaluated prior to induction Oxygen Delivery Method: Circle system utilized Preoxygenation: Pre-oxygenation with 100% oxygen Induction Type: IV induction Ventilation: Mask ventilation without difficulty and Oral airway inserted - appropriate to patient size Laryngoscope Size: Mac and 4 Grade View: Grade I Tube type: Oral Number of attempts: 1 Airway Equipment and Method: Stylet Placement Confirmation: ETT inserted through vocal cords under direct vision,  positive ETCO2 and breath sounds checked- equal and bilateral Secured at: 22 cm Tube secured with: Tape Dental Injury: Teeth and Oropharynx as per pre-operative assessment

## 2018-04-10 NOTE — Anesthesia Preprocedure Evaluation (Addendum)
Anesthesia Evaluation  Patient identified by MRN, date of birth, ID band Patient awake    Reviewed: Allergy & Precautions, NPO status , Patient's Chart, lab work & pertinent test results  Airway Mallampati: II  TM Distance: >3 FB     Dental   Pulmonary sleep apnea , COPD, Current Smoker,    breath sounds clear to auscultation       Cardiovascular hypertension, + angina + CAD, + Past MI and +CHF   Rhythm:Regular Rate:Normal     Neuro/Psych    GI/Hepatic Neg liver ROS, PUD, GERD  ,  Endo/Other  Hypothyroidism   Renal/GU negative Renal ROS     Musculoskeletal  (+) Arthritis ,   Abdominal   Peds  Hematology   Anesthesia Other Findings   Reproductive/Obstetrics                           Anesthesia Physical Anesthesia Plan  ASA: III  Anesthesia Plan: General   Post-op Pain Management:    Induction: Intravenous  PONV Risk Score and Plan: Ondansetron, Dexamethasone and Midazolam  Airway Management Planned: Oral ETT  Additional Equipment:   Intra-op Plan:   Post-operative Plan: Extubation in OR  Informed Consent: I have reviewed the patients History and Physical, chart, labs and discussed the procedure including the risks, benefits and alternatives for the proposed anesthesia with the patient or authorized representative who has indicated his/her understanding and acceptance.   Dental advisory given  Plan Discussed with: CRNA and Anesthesiologist  Anesthesia Plan Comments:         Anesthesia Quick Evaluation

## 2018-04-10 NOTE — Op Note (Signed)
NAME: CLARNCE, HOMAN MEDICAL RECORD NW:[id number] ACCOUNT [id number] DATE OF BIRTH:Oct 08, 1950 FACILITY: [facility] LOCATION: [facility] PHYSICIAN:Zaryia Markel Scotty Court, MD  OPERATIVE REPORT  DATE OF PROCEDURE:  04/10/2018  PREOPERATIVE DIAGNOSIS:  Chronic exotropia of the left eye.    PROCEDURE PERFORMED:  Left medial rectus resection on adjustable sutures, left lateral rectus recession 10 mm, left medial rectus resection on adjustable sutures of 6 mm.    POSTOPERATIVE DIAGNOSIS:  Status post left medial rectus resection 6 mm, left lateral rectus recession 10 mm.  ANESTHESIA:  General with laryngeal mask airway.  INDICATIONS FOR PROCEDURE:  The patient is a 67 year old male with chronic exotropia of the left eye.  This procedure is indicated to restore single binocular vision and restore alignment of the visual axis.  The risks and benefits of the procedure  explained to the patient prior to procedure, informed consent was obtained.  DESCRIPTION OF TECHNIQUE:  The patient was taken to the operating room and placed in the supine position.  The entire face was prepped and draped in the usual sterile fashion after induction of general anesthesia and establishment of laryngeal mask  airway.  My attention was first directed to the left eye.  A lid speculum was placed.  Forced duction tests were performed and found to be negative.  The globe was then held in the inferior nasal quadrant.  The eye was elevated and abducted.  An incision  was made through the inferior nasal fornix taken down to the posterior sub-Tenons space and the left medial rectus tendon was then isolated on a Stevens hook and subsequently, a Green hook was used to carefully dissect it free from its overlying muscle  fascia and intermuscular septum was transected.  A second Green hook was then passed beneath the tendon.  This was used to hold the globe in an elevated and abducted position.  Next, a mark was then placed on the tendon at 5  mm from its native insertion.   The tendon was then imbricated with 6-0 Vicryl suture, taking 2 locking bites at the medial and temporal apices at the preplaced mark.  The tendon was advanced to a point 1 mm anterior to its insertion on the preplaced sutures and reattached to the globe  in an adjustable suture fashion, thereby completing a 6 mm resection via plication.  The sutures were tied in an adjustable suture fashion and reposited underneath the conjunctiva.  Next, my attention was directed to the left lateral rectus tendon.  The  globe was held in the inferior temporal quadrant.  The eye was elevated and abducted.  An incision was made through the inferior temporal fornix taken down to the posterior subtenons space.  The left lateral rectus tendon was then isolated on a  Stevens hook and subsequently on a green hook.  A second Green hook was then passed beneath the tendon and this was used to hold the globe in an elevated and adducted position.  Next, the tendon was then imbricated with 6-0 Vicryl sutures taken 2 locking  bites at the medial and temporal apices. The tendon was then dissected free from the globe and recessed exactly 10 mm from its native insertion and reattached to the globe using the preplaced sutures.  The suture was tied securely.  The conjunctiva was repositioned.   The patient was then allowed to awaken from the anesthesia for approximately 30 minutes and then once awake, the patient was given a target to look at and the sutures of the medial  rectus tendon was adjusted.  The patient was adjusted to orthophoria.  The  suture was tied permanently and then TobraDex ointment was instilled in inferior fornices of the left eye.  At the conclusion of the procedure. There were no apparent complications.  TN/NUANCE  D:04/10/2018 T:04/10/2018 JOB:[id number]

## 2018-04-10 NOTE — Brief Op Note (Signed)
04/10/2018  11:53 AM  PATIENT:  Bruce SabinJohn Harshfield  67 y.o. male  PRE-OPERATIVE DIAGNOSIS:  EXOTROPIA LEFT EYE  POST-OPERATIVE DIAGNOSIS:  EXOTROPIA LEFT EYE  PROCEDURE:  Procedure(s): LEFT MEDIAN RECTUS REPAIR (Left) LEFT MUSCLE RECESSION (Left) ADJUSTABLE SUTURE MANIPULATION (Left)  SURGEON:  Surgeon(s) and Role:    Aura Camps* Coleson Kant, MD - Primary  PHYSICIAN ASSISTANT:   ASSISTANTS: none   ANESTHESIA:   general  EBL:     None  BLOOD ADMINISTERED:none  DRAINS: none   LOCAL MEDICATIONS USED:  NONE  SPECIMEN:  No Specimen  DISPOSITION OF SPECIMEN:  N/A  COUNTS:  YES  TOURNIQUET:  * No tourniquets in log *  DICTATION: .Other Dictation: Dictation Number [id number]  PLAN OF CARE: Discharge to home after PACU  PATIENT DISPOSITION:  PACU - hemodynamically stable.   Delay start of Pharmacological VTE agent (>24hrs) due to surgical blood loss or risk of bleeding: yes

## 2018-04-11 ENCOUNTER — Encounter (HOSPITAL_COMMUNITY): Payer: Self-pay | Admitting: Ophthalmology

## 2018-04-11 NOTE — Anesthesia Postprocedure Evaluation (Signed)
Anesthesia Post Note  Patient: Bruce SabinJohn Barrera  Procedure(s) Performed: LEFT MEDIAN RECTUS REPAIR (Left Eye) LEFT MUSCLE RECESSION (Left Eye) ADJUSTABLE SUTURE MANIPULATION (Left Eye)     Patient location during evaluation: PACU Anesthesia Type: General Level of consciousness: awake and alert Pain management: pain level controlled Vital Signs Assessment: post-procedure vital signs reviewed and stable Respiratory status: spontaneous breathing, nonlabored ventilation and respiratory function stable Cardiovascular status: blood pressure returned to baseline and stable Postop Assessment: no apparent nausea or vomiting Anesthetic complications: no    Last Vitals:  Vitals:   04/10/18 1315 04/10/18 1330  BP: 103/66 99/72  Pulse: 95 92  Resp: 19 18  Temp: 36.6 C (!) 36.4 C  SpO2: 94% 95%    Last Pain:  Vitals:   04/10/18 1330  TempSrc:   PainSc: 0-No pain                 Lowella CurbWarren Ray Carrolyn Hilmes

## 2018-05-08 ENCOUNTER — Other Ambulatory Visit: Payer: Self-pay | Admitting: Cardiology

## 2018-07-09 ENCOUNTER — Encounter: Payer: Self-pay | Admitting: Cardiology

## 2018-07-09 ENCOUNTER — Ambulatory Visit (INDEPENDENT_AMBULATORY_CARE_PROVIDER_SITE_OTHER): Payer: Medicare Other | Admitting: Cardiology

## 2018-07-09 VITALS — BP 134/78 | HR 88 | Ht 71.0 in | Wt 261.0 lb

## 2018-07-09 DIAGNOSIS — Z720 Tobacco use: Secondary | ICD-10-CM

## 2018-07-09 DIAGNOSIS — E782 Mixed hyperlipidemia: Secondary | ICD-10-CM

## 2018-07-09 DIAGNOSIS — I25119 Atherosclerotic heart disease of native coronary artery with unspecified angina pectoris: Secondary | ICD-10-CM

## 2018-07-09 DIAGNOSIS — I255 Ischemic cardiomyopathy: Secondary | ICD-10-CM

## 2018-07-09 NOTE — Patient Instructions (Signed)
Medication Instructions:  Your physician recommends that you continue on your current medications as directed. Please refer to the Current Medication list given to you today.  If you need a refill on your cardiac medications before your next appointment, please call your pharmacy.   Lab work: NONE If you have labs (blood work) drawn today and your tests are completely normal, you will receive your results only by: . MyChart Message (if you have MyChart) OR . A paper copy in the mail If you have any lab test that is abnormal or we need to change your treatment, we will call you to review the results.  Testing/Procedures: NONE  Follow-Up: At [facility], you and your health needs are our priority.  As part of our continuing mission to provide you with exceptional heart care, we have created designated Provider Care Teams.  These Care Teams include your primary Cardiologist (physician) and Advanced Practice Providers (APPs -  Physician Assistants and Nurse Practitioners) who all work together to provide you with the care you need, when you need it. You will need a follow up appointment in 6 months.  Please call our office 2 months in advance to schedule this appointment.  You may see Samuel McDowell, MD or one of the following Advanced Practice Providers on your designated Care Team:   Brittany Strader, PA-C ([place] Office) . Michele Lenze, PA-C ([place] Office)  Any Other Special Instructions Will Be Listed Below (If Applicable). NONE   

## 2018-07-09 NOTE — Progress Notes (Signed)
Cardiology Office Note  Date: 07/09/2018   ID: Bruce Barrera, DOB Aug 05, 1951, MRN [id number]  PCP: Bruce Catching, MD  Primary Cardiologist: Bruce Dell, MD   Chief Complaint  Patient presents with  . Cardiomyopathy    History of Present Illness: Bruce Barrera is a 67 y.o. male last seen in May.  He is here for a routine follow-up visit.  He does not report any active angina symptoms, no palpitations.  He is mainly limited by chronic back pain and progressive bilateral knee pain.  He tells me that he may be having knee replacement with Dr. Case sometime next year.  Follow-up echocardiogram in May revealed normalization of LVEF in the range of 55 to 60%, no regional wall motion abnormalities, mild diastolic dysfunction.  Discussed these results again today.  Current cardiac regimen includes aspirin, Coreg, Lipitor, Plavix, Maxide, and Entresto.  I personally reviewed his ECG today which shows sinus rhythm with low voltage, leftward axis, possible old anterior infarct pattern, nonspecific T wave changes.  Reviewed his interval lab work from Bruce Barrera.  Past Medical History:  Diagnosis Date  . Arthritis    Hips and knees  . Back pain   . Chronic systolic CHF (congestive heart failure) (HCC)   . Coronary artery disease    a. 03/2016 subacute anterior MI -> 100% prox LAD >72 hours out thus treated medically, LVEF 30-35% initially  . Essential hypertension   . GERD (gastroesophageal reflux disease)   . Hemorrhoids   . History of stroke    States "stroke" with left sided weakness 5/95 in prison in [place]  . Hyperlipidemia   . Hypothyroidism   . Ischemic cardiomyopathy   . MI (myocardial infarction) (HCC)    States "heart attack" 2/95 in prison in [place]  . Obesity   . Peptic ulcer    GIB 1994  . Sleep apnea     Past Surgical History:  Procedure Laterality Date  . ADJUSTABLE SUTURE MANIPULATION Left 04/10/2018   Procedure: ADJUSTABLE SUTURE MANIPULATION;  Surgeon: Bruce Camps, MD;  Location: [facility] OR;  Service: Ophthalmology;  Laterality: Left;  . CARDIAC CATHETERIZATION N/A 03/15/2016   Procedure: Right/Left Heart Cath and Coronary Angiography;  Surgeon: Bruce Ouch, MD;  [facility];  Service: Cardiovascular;  Laterality: N/A;  . COLONOSCOPY  03/23/2011   hemorrhoids  . KNEE ARTHROSCOPY     Right knee x 2  . MEDIAN RECTUS REPAIR Left 04/10/2018   Procedure: LEFT MEDIAN RECTUS REPAIR;  Surgeon: Bruce Camps, MD;  Location: [facility] OR;  Service: Ophthalmology;  Laterality: Left;  Marland Kitchen MUSCLE RECESSION AND RESECTION Left 04/10/2018   Procedure: LEFT MUSCLE RECESSION;  Surgeon: Bruce Camps, MD;  Location: [facility] OR;  Service: Ophthalmology;  Laterality: Left;  . TOTAL HIP ARTHROPLASTY Right 2014    Current Outpatient Medications  Medication Sig Dispense Refill  . aspirin 81 MG tablet Take 81 mg by mouth at bedtime.     Marland Kitchen atorvastatin (LIPITOR) 80 MG tablet Take 1 tablet (80 mg total) by mouth daily. 30 tablet 1  . carvedilol (COREG) 3.125 MG tablet TAKE ONE TABLET BY MOUTH TWICE DAILY WITH A MEAL. 180 tablet 0  . clopidogrel (PLAVIX) 75 MG tablet Take 1 tablet (75 mg total) by mouth daily. 30 tablet 3  . levothyroxine (SYNTHROID) 100 MCG tablet Take 100 mcg by mouth daily before breakfast.     . pantoprazole (PROTONIX) 40 MG tablet Take 40 mg by mouth at bedtime.    Marland Kitchen  sacubitril-valsartan (ENTRESTO) 24-26 MG Take 1 tablet by mouth 2 (two) times daily. 60 tablet 2  . tobramycin-dexamethasone (TOBRADEX) ophthalmic ointment Place 1 application into the left eye 2 (two) times daily at 10 am and 4 pm. 3.5 g 0  . triamterene-hydrochlorothiazide (MAXZIDE-25) 37.5-25 MG tablet Take 1 tablet by mouth daily at 12 noon.      Current Facility-Administered Medications  Medication Dose Route Frequency Provider Last Rate Last Dose  . 0.9 %  sodium chloride infusion  500 mL Intravenous Continuous Bruce Boop, MD       Allergies:  Patient has no known  allergies.   Social History: The patient  reports that he has been smoking cigarettes. He started smoking about 57 years ago. He has been smoking about 2.00 packs per day. He has never used smokeless tobacco. He reports that he does not drink alcohol or use drugs.   ROS:  Please see the history of present illness. Otherwise, complete review of systems is positive for chronic knee and back pain.  All other systems are reviewed and negative.   Physical Exam: VS:  BP 134/78 (BP Location: Right Arm)   Pulse 88   Ht 5\' 11"  (1.803 m)   Wt 261 lb (118.4 kg)   SpO2 97%   BMI 36.40 kg/m , BMI Body mass index is 36.4 kg/m.  Wt Readings from Last 3 Encounters:  07/09/18 261 lb (118.4 kg)  04/10/18 250 lb (113.4 kg)  01/10/18 254 lb (115.2 kg)    General: Obese male, appears comfortable at rest. HEENT: Conjunctiva and lids normal, oropharynx clear. Neck: Supple, no elevated JVP or carotid bruits, no thyromegaly. Lungs: Decreased breath sounds without wheezing, nonlabored breathing at rest. Cardiac: Regular rate and rhythm, no S3 or significant systolic murmur. Abdomen: Soft, nontender, bowel sounds present. Extremities: No pitting edema, distal pulses 2+. Skin: Warm and dry. Musculoskeletal: No kyphosis. Neuropsychiatric: Alert and oriented x3, affect grossly appropriate.  ECG: I personally reviewed the tracing from 06/25/2017 which showed sinus rhythm with poor R wave progression rule out old anterior infarct pattern and nonspecific T wave changes.  Recent Labwork: 04/10/2018: BUN 12; Creatinine, Ser 0.91; Hemoglobin 18.3; Platelets 159; Potassium 3.7; Sodium 140  July 2019: Hemoglobin 16.5, platelets 150, BUN 16, creatinine 1.06, potassium 4.5, AST 20, ALT 20, cholesterol 120, triglycerides 96, HDL 35, LDL 66  Other Studies Reviewed Today:  Echocardiogram 01/17/2018: Study Conclusions  - Left ventricle: The cavity size was normal. Wall thickness was   increased in a pattern of mild  LVH. Systolic function was normal.   The estimated ejection fraction was in the range of 55% to 60%.   Wall motion was normal; there were no regional wall motion   abnormalities. Doppler parameters are consistent with abnormal   left ventricular relaxation (grade 1 diastolic dysfunction). - Aortic valve: Mildly calcified annulus. Trileaflet; normal   thickness leaflets. Valve area (VTI): 2.7 cm^2. Valve area   (Vmax): 2.81 cm^2. Valve area (Vmean): 2.45 cm^2. - Mitral valve: Mildly calcified annulus. Normal thickness leaflets. - Atrial septum: No defect or patent foramen ovale was identified.  Assessment and Plan:  1.  Ischemic cardiomyopathy with normalization of LVEF by follow-up echocardiogram in May of this year.  He is tolerating medical therapy, no changes are made today.  2.  CAD with occluded LAD associated with subacute anterior infarct in July 2017.  He reports no active angina.  Continue antiplatelet regimen and statin.  3.  Mixed hyperlipidemia, continues on  Lipitor with most recent LDL 66.  4.  Tobacco abuse, smoking cessation discussed.  Current medicines were reviewed with the patient today.   Orders Placed This Encounter  Procedures  . EKG 12-Lead    Disposition: Follow-up in 6 months.  Signed, Jonelle Sidle, MD, [facility] 07/09/2018 10:16 AM    [facility] at [facility] [address] [place], [place] [address] Phone: [phone]; Fax: [phone]

## 2018-08-15 ENCOUNTER — Other Ambulatory Visit: Payer: Self-pay | Admitting: *Deleted

## 2018-08-15 MED ORDER — CARVEDILOL 3.125 MG PO TABS: 3.1250 mg | ORAL_TABLET | Freq: Two times a day (BID) | ORAL | 2 refills | Status: DC

## 2019-06-19 ENCOUNTER — Ambulatory Visit (INDEPENDENT_AMBULATORY_CARE_PROVIDER_SITE_OTHER): Payer: Medicare Other | Admitting: Internal Medicine

## 2019-06-19 ENCOUNTER — Encounter (INDEPENDENT_AMBULATORY_CARE_PROVIDER_SITE_OTHER): Payer: Self-pay | Admitting: Internal Medicine

## 2019-06-19 ENCOUNTER — Other Ambulatory Visit: Payer: Self-pay

## 2019-06-19 VITALS — BP 122/80 | HR 91 | Temp 98.0°F | Ht 71.0 in | Wt 262.0 lb

## 2019-06-19 DIAGNOSIS — I10 Essential (primary) hypertension: Secondary | ICD-10-CM

## 2019-06-19 DIAGNOSIS — E559 Vitamin D deficiency, unspecified: Secondary | ICD-10-CM

## 2019-06-19 DIAGNOSIS — E039 Hypothyroidism, unspecified: Secondary | ICD-10-CM

## 2019-06-19 DIAGNOSIS — B070 Plantar wart: Secondary | ICD-10-CM

## 2019-06-19 DIAGNOSIS — E782 Mixed hyperlipidemia: Secondary | ICD-10-CM

## 2019-06-19 DIAGNOSIS — F17200 Nicotine dependence, unspecified, uncomplicated: Secondary | ICD-10-CM

## 2019-06-19 DIAGNOSIS — Z1159 Encounter for screening for other viral diseases: Secondary | ICD-10-CM | POA: Diagnosis not present

## 2019-06-19 HISTORY — DX: Vitamin D deficiency, unspecified: E55.9

## 2019-06-19 NOTE — Progress Notes (Signed)
Wellness Office Visit  Subjective:  Patient ID: Bruce Barrera, male    DOB: [birth year]  Age: 68 y.o. MRN: [id number]  CC: This man comes in for follow-up of his multiple medical problems including hypothyroidism, vitamin D deficiency, obesity, hyperlipidemia. HPI  The last time I saw him, which was the first time I saw him, I discussed tobacco cessation and he has managed to cut down from 3 packs of cigarettes a day to almost 2 packs of cigarettes a day now.  His plan is to quit by the first half of next year.  I think this at least is a reasonable and possibly achievable goal. Also when I saw him the last time, he had been out of all his medications for several months and he restarted taking his medications.  I see that his thyroid was suboptimally controlled. He is also complaining today of what looks like a wart on the right plantar aspect.  He has had this for more than a year but seems to be causing him discomfort now. Past Medical History:  Diagnosis Date  . Arthritis    Hips and knees  . Back pain   . Chronic systolic CHF (congestive heart failure) (HCC)   . Coronary artery disease    a. 03/2016 subacute anterior MI -> 100% prox LAD >72 hours out thus treated medically, LVEF 30-35% initially  . Essential hypertension   . GERD (gastroesophageal reflux disease)   . Hemorrhoids   . History of stroke    States "stroke" with left sided weakness 5/95 in prison in [place]  . Hyperlipidemia   . Hypothyroidism   . Hypothyroidism, adult 06/19/2019  . Ischemic cardiomyopathy   . MI (myocardial infarction) (HCC)    States "heart attack" 2/95 in prison in [place]  . Obesity   . Peptic ulcer    GIB 1994  . Sleep apnea   . Vitamin D deficiency disease 06/19/2019      Family History  Problem Relation Age of Onset  . Hypertension Mother   . Diabetes Mother   . Heart disease Mother   . Stroke Mother   . Stroke Father   . Heart attack Brother   . Aneurysm Brother   . Cirrhosis Brother     Social History   Social History Narrative   Lives with girlfriend for last 8 years.On disability .     Current Meds  Medication Sig  . aspirin 81 MG tablet Take 81 mg by mouth at bedtime.   Marland Kitchen atorvastatin (LIPITOR) 80 MG tablet Take 1 tablet (80 mg total) by mouth daily.  . carvedilol (COREG) 3.125 MG tablet Take 1 tablet (3.125 mg total) by mouth 2 (two) times daily with a meal.  . clopidogrel (PLAVIX) 75 MG tablet Take 1 tablet (75 mg total) by mouth daily.  Marland Kitchen levothyroxine (SYNTHROID) 100 MCG tablet Take 100 mcg by mouth daily before breakfast.   . pantoprazole (PROTONIX) 40 MG tablet Take 40 mg by mouth at bedtime.  . sacubitril-valsartan (ENTRESTO) 24-26 MG Take 1 tablet by mouth 2 (two) times daily.  Marland Kitchen triamterene-hydrochlorothiazide (MAXZIDE-25) 37.5-25 MG tablet Take 1 tablet by mouth daily at 12 noon.   . [DISCONTINUED] tobramycin-dexamethasone (TOBRADEX) ophthalmic ointment Place 1 application into the left eye 2 (two) times daily at 10 am and 4 pm.     Objective:   Today's Vitals: BP 122/80 (BP Location: Right Arm, Patient Position: Sitting, Cuff Size: Normal)   Pulse 91   Temp 98 F (  36.7 C) (Temporal)   Ht 5\' 11"  (1.803 m)   Wt 262 lb (118.8 kg)   SpO2 92% Comment: room air;w/ mask  BMI 36.54 kg/m  Vitals with BMI 06/19/2019 07/09/2018 04/10/2018  Height 5\' 11"  5\' 11"  -  Weight 262 lbs 261 lbs -  BMI 23.55 73.22 -  Systolic 025 427 99  Diastolic 80 78 72  Pulse 91 88 92     Physical Exam    He looks systemically well.  He remains obese.  Blood pressure is well controlled.  Examination of his right foot she seems to show a right plantar wart on the lateral aspect towards the right little toe.   Assessment   1. Essential hypertension, benign   2. Morbid obesity ([name])   3. Hypothyroidism, adult   4. Vitamin D deficiency disease   5. Tobacco use disorder   6. Mixed hyperlipidemia   7. Encounter for hepatitis C screening test for low risk patient   8.  Plantar wart of right foot       Tests ordered Orders Placed This Encounter  Procedures  . COMPLETE METABOLIC PANEL WITH GFR  . Lipid panel  . T3, free  . TSH  . Hepatitis C antibody  . Ambulatory referral to Dermatology     Plan: 1. He will continue with his antihypertensive medications as his blood pressure seems to be well controlled. 2. We will get him to come back to discuss with Bruce Barrera nutrition in more detail to help him with his obesity and I have given him a handout today that he can start reading in preparation for this. 3. He will continue on Synthroid for his hypothyroidism and we will see what we need to adjust.  I would prefer for him to be on desiccated thyroid so that he can receive T3 as well as T4. 4. His vitamin D was low and I will address this once I have all the results from his thyroid. 5. I am glad he is reducing his tobacco intake and hopefully he can quit. 6. For his plantar wart, I will refer him to dermatology in [place]. 7. Further recommendations will depend on blood results and I will have him follow-up with Bruce Barrera in the next few weeks.     Doree Albee, MD

## 2019-06-19 NOTE — Patient Instructions (Signed)
[facility] Dietary Recommendations for Weight Loss What to Avoid . Avoid added sugars o Often added sugar can be found in processed foods such as many condiments, dry cereals, cakes, cookies, chips, crisps, crackers, candies, sweetened drinks, etc.  o Read labels and AVOID/DECREASE use of foods with the following in their ingredient list: Sugar, fructose, high fructose corn syrup, sucrose, glucose, maltose, dextrose, molasses, cane sugar, brown sugar, any type of syrup, agave nectar, etc.   . Avoid snacking in between meals . Avoid foods made with flour o If you are going to eat food made with flour, choose those made with whole-grains; and, minimize your consumption as much as is tolerable . Avoid processed foods o These foods are generally stocked in the middle of the grocery store. Focus on shopping on the perimeter of the grocery.  What to Include . Vegetables o GREEN LEAFY VEGETABLES: Kale, spinach, mustard greens, collard greens, cabbage, broccoli, etc. o OTHER: Asparagus, cauliflower, eggplant, carrots, peas, Brussel sprouts, tomatoes, bell peppers, zucchini, beets, cucumbers, etc. . Grains, seeds, and legumes o Beans: kidney beans, black eyed peas, garbanzo beans, black beans, pinto beans, etc. o Whole, unrefined grains: brown rice, barley, bulgur, oatmeal, etc. . Healthy fats  o Avoid highly processed fats such as vegetable oil o Examples of healthy fats: avocado, olives, virgin olive oil, dark chocolate (?72% Cocoa), nuts (peanuts, almonds, walnuts, cashews, pecans, etc.) . Low - Moderate Intake of Animal Sources of Protein o Meat sources: chicken, turkey, salmon, tuna. Limit to 4 ounces of meat at one time. o Consider limiting dairy sources, but when choosing dairy focus on: PLAIN Greek yogurt, cottage cheese, high-protein milk . Fruit o Choose berries  When to Eat . Intermittent Fasting: o Choosing not to eat for a specific time period, but DO FOCUS ON HYDRATION  when fasting o Multiple Techniques: - Time Restricted Eating: eat 3 meals in a day, each meal lasting no more than 60 minutes, no snacks between meals - 16-18 hour fast: fast for 16 to 18 hours up to 7 days a week. Often suggested to start with 2-3 nonconsecutive days per week.  . Remember the time you sleep is counted as fasting.  . Examples of eating schedule: Fast from 7:00pm-11:00am. Eat between 11:00am-7:00pm.  - 24-hour fast: fast for 24 hours up to every other day. Often suggested to start with 1 day per week . Remember the time you sleep is counted as fasting . Examples of eating schedule:  o Eating day: eat 2-3 meals on your eating day. If doing 2 meals, each meal should last no more than 90 minutes. If doing 3 meals, each meal should last no more than 60 minutes. Finish last meal by 7:00pm. o Fasting day: Fast until 7:00pm.  o IF YOU FEEL UNWELL FOR ANY REASON/IN ANY WAY WHEN FASTING, STOP FASTING BY EATING A NUTRITIOUS SNACK OR LIGHT MEAL o ALWAYS FOCUS ON HYDRATION DURING FASTS - Acceptable Hydration sources: water, broths, tea/coffee (black tea/coffee is best but using a small amount of whole-fat dairy products in coffee/tea is acceptable).  - Poor Hydration Sources: anything with sugar or artificial sweeteners added to it  These recommendations have been developed for patients that are actively receiving medical care from either Dr. Yousif Edelson or Sarah Gray, DNP, NP-C at [facility]. These recommendations are developed for patients with specific medical conditions and are not meant to be distributed or used by others that are not actively receiving care from either provider listed   above at [facility]. It is not appropriate to participate in the above eating plans without proper medical supervision.   Reference: Fung, J. The obesity code. [place]: [place]; 2016.   

## 2019-06-20 LAB — COMPLETE METABOLIC PANEL WITH GFR
AG Ratio: 1.7 (calc) (ref 1.0–2.5)
ALT: 13 U/L (ref 9–46)
AST: 16 U/L (ref 10–35)
Albumin: 4.3 g/dL (ref 3.6–5.1)
Alkaline phosphatase (APISO): 102 U/L (ref 35–144)
BUN: 22 mg/dL (ref 7–25)
CO2: 31 mmol/L (ref 20–32)
Calcium: 9.5 mg/dL (ref 8.6–10.3)
Chloride: 104 mmol/L (ref 98–110)
Creat: 0.91 mg/dL (ref 0.70–1.25)
GFR, Est African American: 101 mL/min/{1.73_m2} (ref >=60)
GFR, Est Non African American: 87 mL/min/{1.73_m2} (ref >=60)
Globulin: 2.5 g/dL (calc) (ref 1.9–3.7)
Glucose, Bld: 138 mg/dL — ABNORMAL HIGH (ref 65–99)
Potassium: 4.4 mmol/L (ref 3.5–5.3)
Sodium: 142 mmol/L (ref 135–146)
Total Bilirubin: 0.4 mg/dL (ref 0.2–1.2)
Total Protein: 6.8 g/dL (ref 6.1–8.1)

## 2019-06-20 LAB — LIPID PANEL
Cholesterol: 136 mg/dL (ref <200)
HDL: 27 mg/dL — ABNORMAL LOW (ref >=40)
LDL Cholesterol (Calc): 75 mg/dL (calc)
Non-HDL Cholesterol (Calc): 109 mg/dL (calc) (ref <130)
Total CHOL/HDL Ratio: 5 (calc) — ABNORMAL HIGH (ref <5.0)
Triglycerides: 258 mg/dL — ABNORMAL HIGH (ref <150)

## 2019-06-20 LAB — TSH: TSH: 2.7 mIU/L (ref 0.40–4.50)

## 2019-06-20 LAB — T3, FREE: T3, Free: 3.1 pg/mL (ref 2.3–4.2)

## 2019-06-20 LAB — HEPATITIS C ANTIBODY
Hepatitis C Ab: NONREACTIVE
SIGNAL TO CUT-OFF: 0.07 (ref <1.00)

## 2019-06-25 ENCOUNTER — Other Ambulatory Visit (INDEPENDENT_AMBULATORY_CARE_PROVIDER_SITE_OTHER): Payer: Self-pay | Admitting: Internal Medicine

## 2019-06-25 ENCOUNTER — Telehealth (INDEPENDENT_AMBULATORY_CARE_PROVIDER_SITE_OTHER): Payer: Self-pay | Admitting: Internal Medicine

## 2019-06-25 MED ORDER — PANTOPRAZOLE SODIUM 40 MG PO TBEC: 40.0000 mg | DELAYED_RELEASE_TABLET | Freq: Every day | ORAL | 3 refills | Status: DC

## 2019-06-25 MED ORDER — LEVOTHYROXINE SODIUM 100 MCG PO TABS: 100.0000 ug | ORAL_TABLET | Freq: Every day | ORAL | 3 refills | Status: DC

## 2019-06-25 MED ORDER — CLOPIDOGREL BISULFATE 75 MG PO TABS: 75.0000 mg | ORAL_TABLET | Freq: Every day | ORAL | 3 refills | Status: DC

## 2019-06-25 MED ORDER — CARVEDILOL 3.125 MG PO TABS: 3.1250 mg | ORAL_TABLET | Freq: Two times a day (BID) | ORAL | 0 refills | Status: DC

## 2019-06-25 MED ORDER — ATORVASTATIN CALCIUM 80 MG PO TABS: 80.0000 mg | ORAL_TABLET | Freq: Every day | ORAL | 0 refills | Status: DC

## 2019-06-25 MED ORDER — SACUBITRIL-VALSARTAN 24-26 MG PO TABS: 1.0000 | ORAL_TABLET | Freq: Two times a day (BID) | ORAL | 2 refills | Status: DC

## 2019-06-25 NOTE — Telephone Encounter (Signed)
Done

## 2019-07-01 DIAGNOSIS — B070 Plantar wart: Secondary | ICD-10-CM | POA: Diagnosis not present

## 2019-08-19 ENCOUNTER — Ambulatory Visit (INDEPENDENT_AMBULATORY_CARE_PROVIDER_SITE_OTHER): Payer: Medicare Other | Admitting: Nurse Practitioner

## 2019-08-25 ENCOUNTER — Encounter: Payer: Self-pay | Admitting: Cardiology

## 2019-08-25 ENCOUNTER — Ambulatory Visit (INDEPENDENT_AMBULATORY_CARE_PROVIDER_SITE_OTHER): Payer: Medicare Other | Admitting: Cardiology

## 2019-08-25 ENCOUNTER — Telehealth: Payer: Self-pay | Admitting: Cardiology

## 2019-08-25 VITALS — BP 141/88 | HR 82 | Temp 97.5°F | Ht 71.0 in | Wt 265.0 lb

## 2019-08-25 DIAGNOSIS — I25119 Atherosclerotic heart disease of native coronary artery with unspecified angina pectoris: Secondary | ICD-10-CM

## 2019-08-25 DIAGNOSIS — Z136 Encounter for screening for cardiovascular disorders: Secondary | ICD-10-CM

## 2019-08-25 DIAGNOSIS — I255 Ischemic cardiomyopathy: Secondary | ICD-10-CM | POA: Diagnosis not present

## 2019-08-25 DIAGNOSIS — E782 Mixed hyperlipidemia: Secondary | ICD-10-CM

## 2019-08-25 NOTE — Telephone Encounter (Signed)
  Precert needed for: abd [place] for AAA screening   Location:   [facility]  Date: Aug 29, 2019

## 2019-08-25 NOTE — Progress Notes (Signed)
Cardiology Office Note  Date: 08/25/2019   ID: Bruce Barrera, DOB 1951/01/25, MRN [id number]  PCP:  Wilson Singer, MD  Cardiologist:  Nona Dell, MD Electrophysiologist:  None   Chief Complaint  Patient presents with  . Cardiac follow-up    History of Present Illness: Bruce Barrera is a 68 y.o. male last seen in October 2019.  He presents overdue for follow-up.  He reports chronic dyspnea on exertion and is also limited by back and bilateral knee pain, uses a cane when he walks.  No definite angina however.  Echocardiogram from May of last year revealed LVEF 55 to 60% range.  He has continued on medical therapy as outlined below.  Current cardiac regimen includes aspirin, Lipitor, Coreg, Plavix, Maxide, and Entresto.  I reviewed his lab work from October, LDL was 75 at that time, LFTs normal.  He tells me that he has a brother that died with an abdominal aortic aneurysm.  He has not undergone any screening.  I personally reviewed his ECG today which shows sinus rhythm with nonspecific T wave changes and old anterior infarct pattern.  Past Medical History:  Diagnosis Date  . Arthritis    Hips and knees  . Back pain   . Chronic systolic CHF (congestive heart failure) (HCC)   . Coronary artery disease    a. 03/2016 subacute anterior MI -> 100% prox LAD >72 hours out thus treated medically, LVEF 30-35% initially  . Essential hypertension   . GERD (gastroesophageal reflux disease)   . Hemorrhoids   . History of stroke    States "stroke" with left sided weakness 5/95 in prison in [place]  . Hyperlipidemia   . Hypothyroidism   . Ischemic cardiomyopathy   . MI (myocardial infarction) (HCC)    States "heart attack" 2/95 in prison in [place]  . Obesity   . Peptic ulcer    GIB 1994  . Sleep apnea   . Vitamin D deficiency disease 06/19/2019    Past Surgical History:  Procedure Laterality Date  . ADJUSTABLE SUTURE MANIPULATION Left 04/10/2018   Procedure: ADJUSTABLE SUTURE  MANIPULATION;  Surgeon: Aura Camps, MD;  Location[facility] OR;  Service: Ophthalmology;  Laterality: Left;  . CARDIAC CATHETERIZATION N/A 03/15/2016   Procedure: Right/Left Heart Cath and Coronary Angiography;  Surgeon: Iran Ouch, MD;  [facility];  Service: Cardiovascular;  Laterality: N/A;  . COLONOSCOPY  03/23/2011   hemorrhoids  . KNEE ARTHROSCOPY     Right knee x 2  . MEDIAN RECTUS REPAIR Left 04/10/2018   Procedure: LEFT MEDIAN RECTUS REPAIR;  Surgeon: Aura Camps, MD;  Location: [facility] OR;  Service: Ophthalmology;  Laterality: Left;  Marland Kitchen MUSCLE RECESSION AND RESECTION Left 04/10/2018   Procedure: LEFT MUSCLE RECESSION;  Surgeon: Aura Camps, MD;  Location: [facility] OR;  Service: Ophthalmology;  Laterality: Left;  . TOTAL HIP ARTHROPLASTY Right 2014    Current Outpatient Medications  Medication Sig Dispense Refill  . aspirin 81 MG tablet Take 81 mg by mouth at bedtime.     Marland Kitchen atorvastatin (LIPITOR) 80 MG tablet Take 1 tablet (80 mg total) by mouth daily. 90 tablet 0  . carvedilol (COREG) 3.125 MG tablet Take 1 tablet (3.125 mg total) by mouth 2 (two) times daily with a meal. 180 tablet 0  . clopidogrel (PLAVIX) 75 MG tablet Take 1 tablet (75 mg total) by mouth daily. 30 tablet 3  . levothyroxine (SYNTHROID) 100 MCG tablet Take 1 tablet (100 mcg total)  by mouth daily before breakfast. 30 tablet 3  . pantoprazole (PROTONIX) 40 MG tablet Take 1 tablet (40 mg total) by mouth at bedtime. 30 tablet 3  . sacubitril-valsartan (ENTRESTO) 24-26 MG Take 1 tablet by mouth 2 (two) times daily. 60 tablet 2  . triamterene-hydrochlorothiazide (MAXZIDE-25) 37.5-25 MG tablet Take 1 tablet by mouth daily at 12 noon.      No current facility-administered medications for this visit.   Allergies:  Patient has no known allergies.   Social History: The patient  reports that he has been smoking cigarettes. He started smoking about 58 years ago. He has been smoking about 2.00 packs per day.  He has never used smokeless tobacco. He reports that he does not drink alcohol or use drugs.   ROS:  Please see the history of present illness. Otherwise, complete review of systems is positive for none.  All other systems are reviewed and negative.   Physical Exam: VS:  BP (!) 141/88   Pulse 82   Temp (!) 97.5 F (36.4 C)   Ht 5\' 11"  (1.803 m)   Wt 265 lb (120.2 kg)   SpO2 95%   BMI 36.96 kg/m , BMI Body mass index is 36.96 kg/m.  Wt Readings from Last 3 Encounters:  08/25/19 265 lb (120.2 kg)  06/19/19 262 lb (118.8 kg)  07/09/18 261 lb (118.4 kg)    General: Patient appears comfortable at rest.  Using a cane. HEENT: Conjunctiva and lids normal, wearing a mask. Neck: Supple, no elevated JVP or carotid bruits, no thyromegaly. Lungs: Clear to auscultation, nonlabored breathing at rest. Cardiac: Regular rate and rhythm, no S3 or significant systolic murmur. Abdomen: Protuberant, nontender, bowel sounds present, no bruit. Extremities: No pitting edema, distal pulses 2+. Skin: Warm and dry. Musculoskeletal: No kyphosis. Neuropsychiatric: Alert and oriented x3, affect grossly appropriate.  ECG:  An ECG dated 07/09/2018 was personally reviewed today and demonstrated:  Sinus rhythm with leftward axis, old anterior infarct pattern.  Recent Labwork: 06/19/2019: ALT 13; AST 16; BUN 22; Creat 0.91; Potassium 4.4; Sodium 142; TSH 2.70     Component Value Date/Time   CHOL 136 06/19/2019 1522   TRIG 258 (H) 06/19/2019 1522   HDL 27 (L) 06/19/2019 1522   CHOLHDL 5.0 (H) 06/19/2019 1522   [place] 75 06/19/2019 1522    Other Studies Reviewed Today:  Echocardiogram 01/17/2018: Study Conclusions  - Left ventricle: The cavity size was normal. Wall thickness was   increased in a pattern of mild LVH. Systolic function was normal.   The estimated ejection fraction was in the range of 55% to 60%.   Wall motion was normal; there were no regional wall motion   abnormalities. Doppler  parameters are consistent with abnormal   left ventricular relaxation (grade 1 diastolic dysfunction). - Aortic valve: Mildly calcified annulus. Trileaflet; normal   thickness leaflets. Valve area (VTI): 2.7 cm^2. Valve area   (Vmax): 2.81 cm^2. Valve area (Vmean): 2.45 cm^2. - Mitral valve: Mildly calcified annulus. Normal thickness leaflets. - Atrial septum: No defect or patent foramen ovale was identified.  Assessment and Plan:  1.  History of ischemic cardiomyopathy with normal LVEF at 55 to 60% by follow-up echocardiogram last year.  He continues on [place].  2.  CAD with occluded LAD associated with subacute anterior infarct in 2017. Continue antiplatelet regimen along with beta-blocker and statin.  3.  Mixed hyperlipidemia, LDL 75 on Lipitor.  4.  Family history of abdominal aortic aneurysm in a  brother.  We will obtain a screening abdominal ultrasound.  Medication Adjustments/Labs and Tests Ordered: Current medicines are reviewed at length with the patient today.  Concerns regarding medicines are outlined above.   Tests Ordered: Orders Placed This Encounter  Procedures  . US AORTA MEDICARE SCREENING  . EKG 12-Lead    Medication Changes: No orders of the defined types were placed in this encounter.   Disposition:  Follow up 6 months in the [place] office.  Signed, Jonelle SidleSamuel G. Velna Hedgecock, MD, [facility] 08/25/2019 3:10 PM    [facility] [address], [place], [place] [address] Phone: [phone]; Fax: [phone]

## 2019-08-25 NOTE — Patient Instructions (Signed)
Medication Instructions:  Your physician recommends that you continue on your current medications as directed. Please refer to the Current Medication list given to you today.  *If you need a refill on your cardiac medications before your next appointment, please call your pharmacy*  Lab Work: None today If you have labs (blood work) drawn today and your tests are completely normal, you will receive your results only by: Marland Kitchen MyChart Message (if you have MyChart) OR . A paper copy in the mail If you have any lab test that is abnormal or we need to change your treatment, we will call you to review the results.  Testing/Procedures: Your physician has requested that you have an abdominal aorta duplex. During this test, an ultrasound is used to evaluate the aorta. Allow 30 minutes for this exam. Do not eat after midnight the day before and avoid carbonated beverages   Follow-Up: At [facility], you and your health needs are our priority.  As part of our continuing mission to provide you with exceptional heart care, we have created designated Provider Care Teams.  These Care Teams include your primary Cardiologist (physician) and Advanced Practice Providers (APPs -  Physician Assistants and Nurse Practitioners) who all work together to provide you with the care you need, when you need it.  Your next appointment:   6 month(s)  The format for your next appointment:   In Person  Provider:   Rozann Lesches, MD  Other Instructions None     Thank you for choosing [facility] !

## 2019-08-29 ENCOUNTER — Ambulatory Visit (HOSPITAL_COMMUNITY)
Admission: RE | Admit: 2019-08-29 | Discharge: 2019-08-29 | Disposition: A | Discharge status: Home / Self Care | Payer: Medicare Other | Source: Ambulatory Visit | Attending: Cardiology | Admitting: Cardiology

## 2019-08-29 ENCOUNTER — Telehealth: Payer: Self-pay

## 2019-08-29 ENCOUNTER — Other Ambulatory Visit: Payer: Self-pay

## 2019-08-29 DIAGNOSIS — I714 Abdominal aortic aneurysm, without rupture, unspecified: Secondary | ICD-10-CM

## 2019-08-29 DIAGNOSIS — I255 Ischemic cardiomyopathy: Secondary | ICD-10-CM

## 2019-08-29 DIAGNOSIS — I25119 Atherosclerotic heart disease of native coronary artery with unspecified angina pectoris: Secondary | ICD-10-CM | POA: Diagnosis not present

## 2019-08-29 DIAGNOSIS — F17210 Nicotine dependence, cigarettes, uncomplicated: Secondary | ICD-10-CM | POA: Insufficient documentation

## 2019-08-29 DIAGNOSIS — Z136 Encounter for screening for cardiovascular disorders: Secondary | ICD-10-CM | POA: Diagnosis not present

## 2019-08-29 NOTE — Telephone Encounter (Signed)
-----   Message from Satira Sark, MD sent at 08/29/2019  9:36 AM EST ----- Results reviewed.  Please let patient know that screening abdominal ultrasound did demonstrate an abdominal aortic aneurysm measuring 5.3 cm.  I would recommend consultation with vascular surgery regarding further imaging and management.

## 2019-08-29 NOTE — Telephone Encounter (Signed)
Patient aware of results and that referral was made to [facility]

## 2019-09-09 ENCOUNTER — Encounter (HOSPITAL_COMMUNITY): Payer: Self-pay | Admitting: *Deleted

## 2019-09-09 ENCOUNTER — Encounter (HOSPITAL_COMMUNITY)
Admission: EM | Disposition: A | Discharge status: Home / Self Care | Payer: Self-pay | Source: Home / Self Care | Attending: Vascular Surgery

## 2019-09-09 ENCOUNTER — Emergency Department (HOSPITAL_COMMUNITY): Payer: Medicare Other

## 2019-09-09 ENCOUNTER — Emergency Department (HOSPITAL_COMMUNITY): Payer: Medicare Other | Admitting: Certified Registered Nurse Anesthetist

## 2019-09-09 ENCOUNTER — Inpatient Hospital Stay (HOSPITAL_COMMUNITY): Payer: Medicare Other

## 2019-09-09 ENCOUNTER — Inpatient Hospital Stay (HOSPITAL_COMMUNITY)
Admission: EM | Admit: 2019-09-09 | Discharge: 2019-09-11 | DRG: 269 | Disposition: A | Discharge status: Home / Self Care | Payer: Medicare Other | Attending: Vascular Surgery | Admitting: Vascular Surgery

## 2019-09-09 ENCOUNTER — Other Ambulatory Visit: Payer: Self-pay

## 2019-09-09 DIAGNOSIS — I69354 Hemiplegia and hemiparesis following cerebral infarction affecting left non-dominant side: Secondary | ICD-10-CM | POA: Diagnosis not present

## 2019-09-09 DIAGNOSIS — K219 Gastro-esophageal reflux disease without esophagitis: Secondary | ICD-10-CM | POA: Diagnosis not present

## 2019-09-09 DIAGNOSIS — Z7982 Long term (current) use of aspirin: Secondary | ICD-10-CM

## 2019-09-09 DIAGNOSIS — Z79890 Hormone replacement therapy (postmenopausal): Secondary | ICD-10-CM | POA: Diagnosis not present

## 2019-09-09 DIAGNOSIS — E669 Obesity, unspecified: Secondary | ICD-10-CM | POA: Diagnosis present

## 2019-09-09 DIAGNOSIS — I252 Old myocardial infarction: Secondary | ICD-10-CM

## 2019-09-09 DIAGNOSIS — M170 Bilateral primary osteoarthritis of knee: Secondary | ICD-10-CM | POA: Diagnosis not present

## 2019-09-09 DIAGNOSIS — I713 Abdominal aortic aneurysm, ruptured, unspecified: Secondary | ICD-10-CM

## 2019-09-09 DIAGNOSIS — J449 Chronic obstructive pulmonary disease, unspecified: Secondary | ICD-10-CM | POA: Diagnosis not present

## 2019-09-09 DIAGNOSIS — E782 Mixed hyperlipidemia: Secondary | ICD-10-CM | POA: Diagnosis not present

## 2019-09-09 DIAGNOSIS — G4730 Sleep apnea, unspecified: Secondary | ICD-10-CM | POA: Diagnosis not present

## 2019-09-09 DIAGNOSIS — Z03818 Encounter for observation for suspected exposure to other biological agents ruled out: Secondary | ICD-10-CM | POA: Diagnosis not present

## 2019-09-09 DIAGNOSIS — I110 Hypertensive heart disease with heart failure: Secondary | ICD-10-CM | POA: Diagnosis present

## 2019-09-09 DIAGNOSIS — Z8249 Family history of ischemic heart disease and other diseases of the circulatory system: Secondary | ICD-10-CM

## 2019-09-09 DIAGNOSIS — R1030 Lower abdominal pain, unspecified: Secondary | ICD-10-CM | POA: Diagnosis not present

## 2019-09-09 DIAGNOSIS — Z8711 Personal history of peptic ulcer disease: Secondary | ICD-10-CM | POA: Diagnosis not present

## 2019-09-09 DIAGNOSIS — I255 Ischemic cardiomyopathy: Secondary | ICD-10-CM | POA: Diagnosis present

## 2019-09-09 DIAGNOSIS — D696 Thrombocytopenia, unspecified: Secondary | ICD-10-CM | POA: Diagnosis not present

## 2019-09-09 DIAGNOSIS — Z20828 Contact with and (suspected) exposure to other viral communicable diseases: Secondary | ICD-10-CM | POA: Diagnosis present

## 2019-09-09 DIAGNOSIS — T82330A Leakage of aortic (bifurcation) graft (replacement), initial encounter: Secondary | ICD-10-CM | POA: Diagnosis not present

## 2019-09-09 DIAGNOSIS — J441 Chronic obstructive pulmonary disease with (acute) exacerbation: Secondary | ICD-10-CM | POA: Diagnosis not present

## 2019-09-09 DIAGNOSIS — I714 Abdominal aortic aneurysm, without rupture: Secondary | ICD-10-CM | POA: Diagnosis not present

## 2019-09-09 DIAGNOSIS — I2510 Atherosclerotic heart disease of native coronary artery without angina pectoris: Secondary | ICD-10-CM | POA: Diagnosis not present

## 2019-09-09 DIAGNOSIS — Z9889 Other specified postprocedural states: Secondary | ICD-10-CM

## 2019-09-09 DIAGNOSIS — Z79899 Other long term (current) drug therapy: Secondary | ICD-10-CM

## 2019-09-09 DIAGNOSIS — I5022 Chronic systolic (congestive) heart failure: Secondary | ICD-10-CM | POA: Diagnosis not present

## 2019-09-09 DIAGNOSIS — R109 Unspecified abdominal pain: Secondary | ICD-10-CM

## 2019-09-09 DIAGNOSIS — F17210 Nicotine dependence, cigarettes, uncomplicated: Secondary | ICD-10-CM | POA: Diagnosis present

## 2019-09-09 DIAGNOSIS — Z6836 Body mass index (BMI) 36.0-36.9, adult: Secondary | ICD-10-CM

## 2019-09-09 DIAGNOSIS — Z7902 Long term (current) use of antithrombotics/antiplatelets: Secondary | ICD-10-CM

## 2019-09-09 DIAGNOSIS — E039 Hypothyroidism, unspecified: Secondary | ICD-10-CM | POA: Diagnosis present

## 2019-09-09 DIAGNOSIS — Z8679 Personal history of other diseases of the circulatory system: Secondary | ICD-10-CM

## 2019-09-09 HISTORY — PX: ABDOMINAL AORTIC ENDOVASCULAR STENT GRAFT: SHX5707

## 2019-09-09 LAB — CBC WITH DIFFERENTIAL/PLATELET
Abs Immature Granulocytes: 0.11 10*3/uL — ABNORMAL HIGH (ref 0.00–0.07)
Basophils Absolute: 0 10*3/uL (ref 0.0–0.1)
Basophils Relative: 0 %
Eosinophils Absolute: 0 10*3/uL (ref 0.0–0.5)
Eosinophils Relative: 0 %
HCT: 44 % (ref 39.0–52.0)
Hemoglobin: 14.6 g/dL (ref 13.0–17.0)
Immature Granulocytes: 1 %
Lymphocytes Relative: 3 %
Lymphs Abs: 0.4 10*3/uL — ABNORMAL LOW (ref 0.7–4.0)
MCH: 33.1 pg (ref 26.0–34.0)
MCHC: 33.2 g/dL (ref 30.0–36.0)
MCV: 99.8 fL (ref 80.0–100.0)
Monocytes Absolute: 0.5 10*3/uL (ref 0.1–1.0)
Monocytes Relative: 3 %
Neutro Abs: 14.7 10*3/uL — ABNORMAL HIGH (ref 1.7–7.7)
Neutrophils Relative %: 93 %
Platelets: 133 10*3/uL — ABNORMAL LOW (ref 150–400)
RBC: 4.41 MIL/uL (ref 4.22–5.81)
RDW: 14 % (ref 11.5–15.5)
WBC: 15.7 10*3/uL — ABNORMAL HIGH (ref 4.0–10.5)
nRBC: 0 % (ref 0.0–0.2)

## 2019-09-09 LAB — COMPREHENSIVE METABOLIC PANEL
ALT: 16 U/L (ref 0–44)
AST: 18 U/L (ref 15–41)
Albumin: 3.8 g/dL (ref 3.5–5.0)
Alkaline Phosphatase: 91 U/L (ref 38–126)
Anion gap: 11 (ref 5–15)
BUN: 19 mg/dL (ref 8–23)
CO2: 28 mmol/L (ref 22–32)
Calcium: 8.7 mg/dL — ABNORMAL LOW (ref 8.9–10.3)
Chloride: 100 mmol/L (ref 98–111)
Creatinine, Ser: 0.97 mg/dL (ref 0.61–1.24)
GFR calc Af Amer: >60 mL/min (ref >60)
GFR calc non Af Amer: >60 mL/min (ref >60)
Glucose, Bld: 237 mg/dL — ABNORMAL HIGH (ref 70–99)
Potassium: 4.1 mmol/L (ref 3.5–5.1)
Sodium: 139 mmol/L (ref 135–145)
Total Bilirubin: 1 mg/dL (ref 0.3–1.2)
Total Protein: 6.6 g/dL (ref 6.5–8.1)

## 2019-09-09 LAB — URINALYSIS, ROUTINE W REFLEX MICROSCOPIC
Bacteria, UA: NONE SEEN
Bilirubin Urine: NEGATIVE
Glucose, UA: 50 mg/dL — AB
Hgb urine dipstick: NEGATIVE
Ketones, ur: 5 mg/dL — AB
Leukocytes,Ua: NEGATIVE
Nitrite: NEGATIVE
Protein, ur: 30 mg/dL — AB
Specific Gravity, Urine: 1.025 (ref 1.005–1.030)
pH: 5 (ref 5.0–8.0)

## 2019-09-09 LAB — TYPE AND SCREEN
ABO/RH(D): O POS
Antibody Screen: NEGATIVE
Unit division: 0

## 2019-09-09 LAB — RESPIRATORY PANEL BY RT PCR (FLU A&B, COVID)
Influenza A by PCR: NEGATIVE
Influenza B by PCR: NEGATIVE
SARS Coronavirus 2 by RT PCR: NEGATIVE

## 2019-09-09 LAB — PREPARE RBC (CROSSMATCH)

## 2019-09-09 LAB — BPAM RBC
Blood Product Expiration Date: 202101172359
ISSUE DATE / TIME: 202012290814
Unit Type and Rh: 9500

## 2019-09-09 LAB — ABO/RH
ABO/RH(D): O POS
ABO/RH(D): O POS

## 2019-09-09 LAB — PROTIME-INR
INR: 1 (ref 0.8–1.2)
Prothrombin Time: 12.8 seconds (ref 11.4–15.2)

## 2019-09-09 LAB — LIPASE, BLOOD: Lipase: 20 U/L (ref 11–51)

## 2019-09-09 LAB — MRSA PCR SCREENING: MRSA by PCR: NEGATIVE

## 2019-09-09 SURGERY — INSERTION, ENDOVASCULAR STENT GRAFT, AORTA, ABDOMINAL
Anesthesia: General | Site: Groin

## 2019-09-09 MED ORDER — SODIUM CHLORIDE 0.9% IV SOLUTION: Freq: Once | INTRAVENOUS | Status: DC

## 2019-09-09 MED ORDER — KETOROLAC TROMETHAMINE 30 MG/ML IJ SOLN: 15.0000 mg | Freq: Once | INTRAMUSCULAR | Status: DC

## 2019-09-09 MED ORDER — SODIUM CHLORIDE 0.9 % IV SOLN: INTRAVENOUS | Status: DC

## 2019-09-09 MED ORDER — LIDOCAINE HCL (PF) 1 % IJ SOLN
INTRAMUSCULAR | Status: AC
  Filled 2019-09-09: qty 30

## 2019-09-09 MED ORDER — HEPARIN SODIUM (PORCINE) 5000 UNIT/ML IJ SOLN
5000.0000 [IU] | Freq: Three times a day (TID) | INTRAMUSCULAR | Status: DC
  Administered 2019-09-10 – 2019-09-11 (×4): 5000 [IU] via SUBCUTANEOUS
  Filled 2019-09-09 (×4): qty 1

## 2019-09-09 MED ORDER — LIDOCAINE HCL 1 % IJ SOLN
INTRAMUSCULAR | Status: DC | PRN
  Administered 2019-09-09: 40 mL

## 2019-09-09 MED ORDER — ONDANSETRON HCL 4 MG/2ML IJ SOLN: 4.0000 mg | Freq: Four times a day (QID) | INTRAMUSCULAR | Status: DC | PRN

## 2019-09-09 MED ORDER — SODIUM CHLORIDE 0.9 % IV BOLUS
500.0000 mL | Freq: Once | INTRAVENOUS | Status: AC
  Administered 2019-09-09: 500 mL via INTRAVENOUS

## 2019-09-09 MED ORDER — ACETAMINOPHEN 325 MG PO TABS: 325.0000 mg | ORAL_TABLET | ORAL | Status: DC | PRN

## 2019-09-09 MED ORDER — TRIAMTERENE-HCTZ 37.5-25 MG PO TABS
1.0000 | ORAL_TABLET | Freq: Every day | ORAL | Status: DC
  Administered 2019-09-10 – 2019-09-11 (×2): 1 via ORAL
  Filled 2019-09-09 (×2): qty 1

## 2019-09-09 MED ORDER — SODIUM CHLORIDE 0.9 % IV SOLN: 10.0000 mL/h | Freq: Once | INTRAVENOUS | Status: DC

## 2019-09-09 MED ORDER — SENNOSIDES-DOCUSATE SODIUM 8.6-50 MG PO TABS: 1.0000 | ORAL_TABLET | Freq: Every evening | ORAL | Status: DC | PRN

## 2019-09-09 MED ORDER — CEFAZOLIN SODIUM-DEXTROSE 2-3 GM-%(50ML) IV SOLR
INTRAVENOUS | Status: DC | PRN
  Administered 2019-09-09: 2 g via INTRAVENOUS

## 2019-09-09 MED ORDER — LACTATED RINGERS IV SOLN: INTRAVENOUS | Status: DC | PRN

## 2019-09-09 MED ORDER — CHLORHEXIDINE GLUCONATE CLOTH 2 % EX PADS: 6.0000 | MEDICATED_PAD | Freq: Every day | CUTANEOUS | Status: DC

## 2019-09-09 MED ORDER — PANTOPRAZOLE SODIUM 40 MG PO TBEC: 40.0000 mg | DELAYED_RELEASE_TABLET | Freq: Every day | ORAL | Status: DC

## 2019-09-09 MED ORDER — HYDRALAZINE HCL 20 MG/ML IJ SOLN: 5.0000 mg | INTRAMUSCULAR | Status: DC | PRN

## 2019-09-09 MED ORDER — HEPARIN SODIUM (PORCINE) 1000 UNIT/ML IJ SOLN
INTRAMUSCULAR | Status: DC | PRN
  Administered 2019-09-09: 5000 [IU] via INTRAVENOUS

## 2019-09-09 MED ORDER — IODIXANOL 320 MG/ML IV SOLN
INTRAVENOUS | Status: DC | PRN
  Administered 2019-09-09: 120 mL

## 2019-09-09 MED ORDER — IOHEXOL 350 MG/ML SOLN
100.0000 mL | Freq: Once | INTRAVENOUS | Status: AC | PRN
  Administered 2019-09-09: 100 mL via INTRAVENOUS

## 2019-09-09 MED ORDER — MAGNESIUM SULFATE 2 GM/50ML IV SOLN: 2.0000 g | Freq: Every day | INTRAVENOUS | Status: DC | PRN

## 2019-09-09 MED ORDER — GUAIFENESIN-DM 100-10 MG/5ML PO SYRP
15.0000 mL | ORAL_SOLUTION | ORAL | Status: DC | PRN
  Administered 2019-09-09: 15 mL via ORAL
  Filled 2019-09-09: qty 15

## 2019-09-09 MED ORDER — PANTOPRAZOLE SODIUM 40 MG PO TBEC
40.0000 mg | DELAYED_RELEASE_TABLET | Freq: Every day | ORAL | Status: DC
  Administered 2019-09-09 – 2019-09-10 (×2): 40 mg via ORAL
  Filled 2019-09-09 (×2): qty 1

## 2019-09-09 MED ORDER — SODIUM CHLORIDE 0.9 % IV SOLN
INTRAVENOUS | Status: DC | PRN
  Administered 2019-09-09: 500 mL

## 2019-09-09 MED ORDER — FENTANYL CITRATE (PF) 250 MCG/5ML IJ SOLN
INTRAMUSCULAR | Status: DC | PRN
  Administered 2019-09-09: 25 ug via INTRAVENOUS

## 2019-09-09 MED ORDER — PHENYLEPHRINE HCL-NACL 10-0.9 MG/250ML-% IV SOLN
INTRAVENOUS | Status: DC | PRN
  Administered 2019-09-09: 25 ug/min via INTRAVENOUS

## 2019-09-09 MED ORDER — DOCUSATE SODIUM 100 MG PO CAPS
100.0000 mg | ORAL_CAPSULE | Freq: Every day | ORAL | Status: DC
  Administered 2019-09-10 – 2019-09-11 (×2): 100 mg via ORAL
  Filled 2019-09-09 (×2): qty 1

## 2019-09-09 MED ORDER — ATORVASTATIN CALCIUM 80 MG PO TABS
80.0000 mg | ORAL_TABLET | Freq: Every day | ORAL | Status: DC
  Administered 2019-09-09 – 2019-09-11 (×3): 80 mg via ORAL
  Filled 2019-09-09 (×3): qty 1

## 2019-09-09 MED ORDER — BISACODYL 5 MG PO TBEC: 5.0000 mg | DELAYED_RELEASE_TABLET | Freq: Every day | ORAL | Status: DC | PRN

## 2019-09-09 MED ORDER — SODIUM CHLORIDE 0.9 % IV SOLN: 500.0000 mL | Freq: Once | INTRAVENOUS | Status: DC | PRN

## 2019-09-09 MED ORDER — PROTAMINE SULFATE 10 MG/ML IV SOLN
INTRAVENOUS | Status: DC | PRN
  Administered 2019-09-09: 10 mg via INTRAVENOUS
  Administered 2019-09-09 (×2): 20 mg via INTRAVENOUS

## 2019-09-09 MED ORDER — LABETALOL HCL 5 MG/ML IV SOLN: 10.0000 mg | INTRAVENOUS | Status: DC | PRN

## 2019-09-09 MED ORDER — MORPHINE SULFATE (PF) 4 MG/ML IV SOLN
4.0000 mg | Freq: Once | INTRAVENOUS | Status: AC
  Administered 2019-09-09: 4 mg via INTRAVENOUS
  Filled 2019-09-09: qty 1

## 2019-09-09 MED ORDER — FENTANYL CITRATE (PF) 100 MCG/2ML IJ SOLN
50.0000 ug | Freq: Once | INTRAMUSCULAR | Status: AC
  Administered 2019-09-09: 50 ug via INTRAVENOUS
  Filled 2019-09-09: qty 2

## 2019-09-09 MED ORDER — ALUM & MAG HYDROXIDE-SIMETH 200-200-20 MG/5ML PO SUSP: 15.0000 mL | ORAL | Status: DC | PRN

## 2019-09-09 MED ORDER — IBUPROFEN 200 MG PO TABS: 800.0000 mg | ORAL_TABLET | Freq: Three times a day (TID) | ORAL | Status: DC | PRN

## 2019-09-09 MED ORDER — ONDANSETRON HCL 4 MG/2ML IJ SOLN
4.0000 mg | Freq: Once | INTRAMUSCULAR | Status: AC
  Administered 2019-09-09: 4 mg via INTRAVENOUS
  Filled 2019-09-09: qty 2

## 2019-09-09 MED ORDER — PHENOL 1.4 % MT LIQD: 1.0000 | OROMUCOSAL | Status: DC | PRN

## 2019-09-09 MED ORDER — METOPROLOL TARTRATE 5 MG/5ML IV SOLN: 2.0000 mg | INTRAVENOUS | Status: DC | PRN

## 2019-09-09 MED ORDER — CEFAZOLIN SODIUM-DEXTROSE 2-4 GM/100ML-% IV SOLN
2.0000 g | Freq: Three times a day (TID) | INTRAVENOUS | Status: AC
  Administered 2019-09-09 – 2019-09-10 (×2): 2 g via INTRAVENOUS
  Filled 2019-09-09 (×2): qty 100

## 2019-09-09 MED ORDER — POTASSIUM CHLORIDE CRYS ER 20 MEQ PO TBCR: 20.0000 meq | EXTENDED_RELEASE_TABLET | Freq: Every day | ORAL | Status: DC | PRN

## 2019-09-09 MED ORDER — ACETAMINOPHEN 325 MG RE SUPP: 325.0000 mg | RECTAL | Status: DC | PRN

## 2019-09-09 MED ORDER — CLOPIDOGREL BISULFATE 75 MG PO TABS
75.0000 mg | ORAL_TABLET | Freq: Every day | ORAL | Status: DC
  Administered 2019-09-09 – 2019-09-11 (×3): 75 mg via ORAL
  Filled 2019-09-09 (×3): qty 1

## 2019-09-09 MED ORDER — OXYCODONE HCL 5 MG PO TABS
5.0000 mg | ORAL_TABLET | ORAL | Status: DC | PRN
  Administered 2019-09-10: 10 mg via ORAL
  Filled 2019-09-09: qty 2

## 2019-09-09 MED ORDER — 0.9 % SODIUM CHLORIDE (POUR BTL) OPTIME
TOPICAL | Status: DC | PRN
  Administered 2019-09-09: 2000 mL

## 2019-09-09 MED ORDER — IODIXANOL 320 MG/ML IV SOLN
INTRAVENOUS | Status: DC | PRN
  Administered 2019-09-09: 50 mL

## 2019-09-09 MED ORDER — CARVEDILOL 3.125 MG PO TABS
3.1250 mg | ORAL_TABLET | Freq: Two times a day (BID) | ORAL | Status: DC
  Administered 2019-09-09 – 2019-09-11 (×4): 3.125 mg via ORAL
  Filled 2019-09-09 (×4): qty 1

## 2019-09-09 MED ORDER — ASPIRIN EC 81 MG PO TBEC
81.0000 mg | DELAYED_RELEASE_TABLET | Freq: Every day | ORAL | Status: DC
  Administered 2019-09-09 – 2019-09-10 (×2): 81 mg via ORAL
  Filled 2019-09-09 (×2): qty 1

## 2019-09-09 MED ORDER — HYDROMORPHONE HCL 1 MG/ML IJ SOLN: 0.5000 mg | INTRAMUSCULAR | Status: DC | PRN

## 2019-09-09 MED ORDER — LEVOTHYROXINE SODIUM 100 MCG PO TABS
100.0000 ug | ORAL_TABLET | Freq: Every day | ORAL | Status: DC
  Administered 2019-09-10 – 2019-09-11 (×2): 100 ug via ORAL
  Filled 2019-09-09 (×2): qty 1

## 2019-09-09 SURGICAL SUPPLY — 87 items
BAG BANDED W/RUBBER/TAPE 36X54 (MISCELLANEOUS) ×2 IMPLANT
BAG SNAP BAND KOVER 36X36 (MISCELLANEOUS) ×2 IMPLANT
BLADE CLIPPER SURG (BLADE) ×2 IMPLANT
CANISTER SUCT 3000ML PPV (MISCELLANEOUS) ×2 IMPLANT
CATH BEACON 5 .035 65 KMP TIP (CATHETERS) ×2 IMPLANT
CATH BEACON 5 .035 65 VANSC4 (CATHETERS) ×2 IMPLANT
CATH BEACON 5.038 65CM KMP-01 (CATHETERS) ×2 IMPLANT
CATH OMNI FLUSH .035X70CM (CATHETERS) ×6 IMPLANT
CATH VANSCH 5FR 6CM (CATHETERS) ×2 IMPLANT
CLIP VESOCCLUDE MED 24/CT (CLIP) ×2 IMPLANT
CLIP VESOCCLUDE SM WIDE 24/CT (CLIP) ×2 IMPLANT
COIL EMBL 14X103.2 LOOP (Embolic) ×2 IMPLANT
COIL NESTER 14X10 (Embolic) ×2 IMPLANT
COVER BACK TABLE 60X90IN (DRAPES) ×2 IMPLANT
COVER BACK TABLE 80X110 HD (DRAPES) ×2 IMPLANT
COVER DOME SNAP 22 D (MISCELLANEOUS) ×2 IMPLANT
COVER MAYO STAND STRL (DRAPES) ×2 IMPLANT
COVER PROBE W GEL 5X96 (DRAPES) ×2 IMPLANT
COVER WAND RF STERILE (DRAPES) ×2 IMPLANT
DERMABOND ADVANCED (GAUZE/BANDAGES/DRESSINGS) ×2
DERMABOND ADVANCED .7 DNX12 (GAUZE/BANDAGES/DRESSINGS) ×2 IMPLANT
DEVICE CLOSURE PERCLS PRGLD 6F (VASCULAR PRODUCTS) ×3 IMPLANT
DEVICE TORQUE KENDALL .025-038 (MISCELLANEOUS) ×2 IMPLANT
DRSG TEGADERM 2-3/8X2-3/4 SM (GAUZE/BANDAGES/DRESSINGS) ×4 IMPLANT
DRYSEAL FLEXSHEATH 12FR 33CM (SHEATH) ×2
DRYSEAL FLEXSHEATH 12FR 45CM (SHEATH) ×1
DRYSEAL FLEXSHEATH 18FR 33CM (SHEATH) ×1
ELECT BLADE 6.5 EXT (BLADE) ×2 IMPLANT
ELECT REM PT RETURN 9FT ADLT (ELECTROSURGICAL) ×4
ELECTRODE REM PT RTRN 9FT ADLT (ELECTROSURGICAL) ×2 IMPLANT
EXCLUDER AORTIC EXTEND 32X4.5 (Vascular Products) ×2 IMPLANT
EXCLUDER TNK LEG 31MX14X13 (Endovascular Graft) ×1 IMPLANT
EXCLUDER TRUNK LEG 31MX14X13 (Endovascular Graft) ×2 IMPLANT
GAUZE SPONGE 2X2 8PLY STRL LF (GAUZE/BANDAGES/DRESSINGS) ×2 IMPLANT
GLIDEWIRE ADV .035X180CM (WIRE) ×2 IMPLANT
GLOVE BIO SURGEON STRL SZ7.5 (GLOVE) ×2 IMPLANT
GLOVE BIOGEL PI IND STRL 6.5 (GLOVE) ×1 IMPLANT
GLOVE BIOGEL PI INDICATOR 6.5 (GLOVE) ×1
GOWN STRL REUS W/ TWL LRG LVL3 (GOWN DISPOSABLE) ×2 IMPLANT
GOWN STRL REUS W/ TWL XL LVL3 (GOWN DISPOSABLE) ×2 IMPLANT
GOWN STRL REUS W/TWL LRG LVL3 (GOWN DISPOSABLE) ×2
GOWN STRL REUS W/TWL XL LVL3 (GOWN DISPOSABLE) ×2
GRAFT BALLN CATH 65CM (STENTS) ×1 IMPLANT
GUIDEWIRE ANGLED .035X150CM (WIRE) ×2 IMPLANT
KIT BASIN OR (CUSTOM PROCEDURE TRAY) ×2 IMPLANT
KIT TURNOVER KIT B (KITS) ×2 IMPLANT
LEG CONTRALATERAL 16X12X14 (Vascular Products) ×1 IMPLANT
LEG CONTRALATERAL 16X20X13.5 (Vascular Products) ×1 IMPLANT
LEG CONTRALATERAL 16X20X9.5 (Endovascular Graft) ×1 IMPLANT
NEEDLE HYPO 25GX1X1/2 BEV (NEEDLE) ×2 IMPLANT
NEEDLE PERC 18GX7CM (NEEDLE) ×2 IMPLANT
NS IRRIG 1000ML POUR BTL (IV SOLUTION) ×2 IMPLANT
PACK ENDOVASCULAR (PACKS) ×2 IMPLANT
PAD ARMBOARD 7.5X6 YLW CONV (MISCELLANEOUS) ×4 IMPLANT
PERCLOSE PROGLIDE 6F (VASCULAR PRODUCTS) ×6
PROTECTION STATION PRESSURIZED (MISCELLANEOUS) ×2
SET MICROPUNCTURE 5F STIFF (MISCELLANEOUS) ×4 IMPLANT
SHEATH BRITE TIP 8FR 23CM (SHEATH) ×4 IMPLANT
SHEATH DRYSEAL FLEX 12FR 33CM (SHEATH) ×2 IMPLANT
SHEATH DRYSEAL FLEX 12FR 45CM (SHEATH) ×1 IMPLANT
SHEATH DRYSEAL FLEX 18FR 33CM (SHEATH) ×1 IMPLANT
SHEATH PINNACLE 8F 10CM (SHEATH) ×4 IMPLANT
SPONGE GAUZE 2X2 STER 10/PKG (GAUZE/BANDAGES/DRESSINGS) ×2
STATION PROTECTION PRESSURIZED (MISCELLANEOUS) ×1 IMPLANT
STENT GRAFT BALLN CATH 65CM (STENTS) ×1
STENT GRAFT CONTRALAT 16X12X14 (Vascular Products) ×1 IMPLANT
STENT GRAFT CONTRALAT 16X20X9. (Endovascular Graft) ×1 IMPLANT
STENT GRAFT CONTRALAT 20X13.5 (Vascular Products) ×1 IMPLANT
STOPCOCK MORSE 400PSI 3WAY (MISCELLANEOUS) ×4 IMPLANT
SUT MNCRL AB 4-0 PS2 18 (SUTURE) ×4 IMPLANT
SUT PROLENE 3 0 SH DA (SUTURE) ×4 IMPLANT
SUT PROLENE 5 0 C 1 24 (SUTURE) ×2 IMPLANT
SUT PROLENE 6 0 BV (SUTURE) ×4 IMPLANT
SUT PROLENE 6 0 CC (SUTURE) ×2 IMPLANT
SUT VIC AB 2-0 CT1 27 (SUTURE)
SUT VIC AB 2-0 CT1 TAPERPNT 27 (SUTURE) IMPLANT
SUT VIC AB 3-0 SH 27 (SUTURE)
SUT VIC AB 3-0 SH 27X BRD (SUTURE) IMPLANT
SYR 20ML LL LF (SYRINGE) ×2 IMPLANT
SYR CONTROL 10ML LL (SYRINGE) ×2 IMPLANT
SYR MEDRAD MARK V 150ML (SYRINGE) ×2 IMPLANT
TOWEL GREEN STERILE (TOWEL DISPOSABLE) ×2 IMPLANT
TRAY FOLEY MTR SLVR 16FR STAT (SET/KITS/TRAYS/PACK) ×2 IMPLANT
TUBING INJECTOR 48 (MISCELLANEOUS) ×4 IMPLANT
WIRE AMPLATZ SS-J .035X180CM (WIRE) ×4 IMPLANT
WIRE BENTSON .035X145CM (WIRE) ×8 IMPLANT
WIRE TORQFLEX AUST .018X40CM (WIRE) ×2 IMPLANT

## 2019-09-09 NOTE — ED Triage Notes (Signed)
Pt states he feels like he is dehydrated; pt c/o bilateral lower back pain and lower abdominal pain; pt also states his mouth and throat are very dry; pt still able to urinate but states not as much as he usually does

## 2019-09-09 NOTE — Anesthesia Postprocedure Evaluation (Signed)
Anesthesia Post Note  Patient: Giulian Goldring  Procedure(s) Performed: ABDOMINAL AORTIC ENDOVASCULAR STENT GRAFT (N/A Groin)     Patient location during evaluation: PACU Anesthesia Type: General Level of consciousness: awake and alert Pain management: pain level controlled Vital Signs Assessment: post-procedure vital signs reviewed and stable Respiratory status: spontaneous breathing and respiratory function stable Cardiovascular status: stable Postop Assessment: no apparent nausea or vomiting Anesthetic complications: no    Last Vitals:  Vitals:   09/09/19 1235 09/09/19 1240  BP: 115/75   Pulse: 98 97  Resp: 15 14  Temp:  36.5 C  SpO2: 95% 93%    Last Pain:  Vitals:   09/09/19 1240  TempSrc:   PainSc: 0-No pain                 Ena Demary DANIEL

## 2019-09-09 NOTE — Anesthesia Procedure Notes (Signed)
Central Venous Catheter Insertion Performed by: anesthesiologist Start/End12/29/2020 9:24 AM, 09/09/2019 9:34 AM Patient location: Pre-op. Preanesthetic checklist: patient identified, IV checked, site marked, risks and benefits discussed, surgical consent, monitors and equipment checked, pre-op evaluation, timeout performed and anesthesia consent Lidocaine 1% used for infiltration and patient sedated Hand hygiene performed  and maximum sterile barriers used  Catheter size: 8 Fr Total catheter length 16. Central line was placed.Double lumen Procedure performed using ultrasound guided technique. Ultrasound Notes:image(s) printed for medical record Attempts: 1 Following insertion, dressing applied and line sutured. Post procedure assessment: blood return through all ports  Patient tolerated the procedure well with no immediate complications.

## 2019-09-09 NOTE — Op Note (Signed)
Patient name: Bruce Barrera MRN: [id number] DOB: 11-10-1950 Sex: male  09/09/2019 Pre-operative Diagnosis: ruptured AAA Post-operative diagnosis:  Same Surgeon:  Erlene Quan C. Donzetta Matters, MD Assistant: Laurence Slate, PA Procedure Performed: 1.  Bilateral percutaneous common femoral access and closure 2.  Abdominal aortic endograft doing of ruptured aneurysm with 31 x 14 x 13 cm main body left extended with 20 x 9.5 cm device contralateral limb right 20 x 13.5 cm and cuff of aortic neck 32 x 4.5cm 3.  Left hypogastric embolization with 10 mm Nestor coils x2 4.  Selective catheterization left hypogastric artery  Indications: 68 year old male with known 5.3 cm aneurysm.  He presented to the emergency department today with ruptured aneurysm.  He is now indicated for endovascular repair.  Findings: There was a large aneurysm with evident rupture.  The neck was approximately 1 cm long we initially did not have seal until he extended with a cuff.  Distally we initially had seal on the right side did not obtain seal on the left had a type Ib endoleak.  We then coiled the left hypogastric artery and extended into the external iliac artery and we did have seal.  At completion there were maybe 2 late type II from lumbar arteries but no type Ia or B endoleak's.  Patient remained stable throughout the operation.   Procedure:  The patient was identified in the holding area and taken to the operating room where he was placed supine on the operating table.  Patient was awake timeout was called antibiotics were administered.  Ultrasound was used to identify the right common femoral artery this was cannulated micropuncture needle followed by wire sheath.  There is anesthetized 1% lidocaine.  We dilated with 8 French sheath deployed to proglide devices and placed an 8 [place] sheath.  We then used a Kumpe catheter and Glidewire advantage place wire into the descending thoracic aorta.  A balloon was placed to the level of  T11.  At this time we elected to proceed with the patient awake.  Ultrasound was used to identify the left common femoral artery.  The area was anesthetized 1% lidocaine cannulated micropuncture needle followed the wire and sheath.  The Bentson wire was placed the wire tract was dilated 8 French sheath and 2 ProGlide devices placed and an 8 French sheath was placed in the left common femoral artery.  We used a Kumpe catheter Bentson wire placed into Amplatz wire into the descending thoracic aorta and placed an 18 French sheath.  The main body was brought to the mid body of L1.  We then exchanged our balloon as patient was hemodynamically stable for an Omni catheter.  Aortogram was performed with angled views.  We then deployed our main body at the level of the lowest renal which was the left.  At this time we then ballooned the neck to get the graft to stay in place.  We then used multiple catheters ultimately eventually for catheter to cannulate the gate in multiple angles with Bentson wire.  We then placed an Omni catheter were able to spend this.  An Amplatz wire was placed in the contralateral limb 12 x 14 cm was brought and angled view of the right hypogastric was performed in the contralateral limb was deployed.  We then fully deployed our main body.  Contralateral view was taken and then we brought a contralateral limb 20 x 9.5 cm as was deployed.  The entire graft was ironed out.  Completion angiography demonstrated  a type Ia leak as well as a type Ib leak at the left common iliac seal.  We first addressed the type Ia.  We again took angled views of the neck and magnified view.  We then brought a cuff which was 32 x 4.5 cm's and deployed this.  This was ballooned.  Completion demonstrated no residual type I a leak we still had a type Ib distally.  At this time we removed the wire from the right common femoral artery and this groin was closed.  There was good signal at the posterior tibial artery and the  Pro-glide vices were deployed.  Patient was given 5000 units of heparin at this time.  The vein T4 catheter was used to cannulate the hypogastric artery and we confirmed access.  We then deployed to [place] coils that were 10 mm.  We then extended with a 12 x 14 cm device in the left external iliac artery.  This was then ballooned.  Completion angiography now demonstrated no type I endoleak's.  Satisfied we exchanged for a Bentson wire in the left removed our sheath and deployed both Pro-glide devices.  Patient had good signals now on the left foot.  50 mg of protamine was administered and he tolerated this well.  His abdomen was soft at completion.  We placed Dermabond at the site of both access sites.  He did tolerate procedure well without immediate complication.  Contrast: 170 cc  EBL: 250 cc     Lynley Killilea C. Randie Heinz, MD [facility] Office: [phone] Pager: [phone]

## 2019-09-09 NOTE — Progress Notes (Signed)
Attempted to call [facility] (contact on file) no response.  Rowe Pavy, RN

## 2019-09-09 NOTE — Anesthesia Procedure Notes (Signed)
Arterial Line Insertion Start/End12/29/2020 9:25 AM, 09/09/2019 9:30 AM Performed by: Glynda Jaeger, CRNA, CRNA  Preanesthetic checklist: patient identified, IV checked, site marked, risks and benefits discussed, surgical consent, monitors and equipment checked, pre-op evaluation, timeout performed and anesthesia consent Lidocaine 1% used for infiltration and patient sedated Left, radial was placed Catheter size: 20 G Hand hygiene performed  and maximum sterile barriers used  Allen's test indicative of satisfactory collateral circulation Attempts: 1 Procedure performed without using ultrasound guided technique. Following insertion, dressing applied and Biopatch. Post procedure assessment: normal  Patient tolerated the procedure well with no immediate complications.

## 2019-09-09 NOTE — Transfer of Care (Signed)
Immediate Anesthesia Transfer of Care Note  Patient: Bruce Barrera  Procedure(s) Performed: ABDOMINAL AORTIC ENDOVASCULAR STENT GRAFT (N/A Groin)  Patient Location: PACU  Anesthesia Type:MAC  Level of Consciousness: awake, alert , oriented, patient cooperative and responds to stimulation  Airway & Oxygen Therapy: Patient Spontanous Breathing and Patient connected to nasal cannula oxygen  Post-op Assessment: Report given to RN, Post -op Vital signs reviewed and stable and Patient moving all extremities X 4  Post vital signs: Reviewed and stable  Last Vitals:  Vitals Value Taken Time  BP    Temp    Pulse 102 09/09/19 1222  Resp 15 09/09/19 1222  SpO2 95 % 09/09/19 1222  Vitals shown include unvalidated device data.  Last Pain:  Vitals:   09/09/19 0857  TempSrc:   PainSc: 9       Patients Stated Pain Goal: 1 (53/66/44 0347)  Complications: No apparent anesthesia complications

## 2019-09-09 NOTE — ED Notes (Signed)
Report called to Robbie in Short Stay at [facility] at this time. PT in route with [facility].

## 2019-09-09 NOTE — ED Notes (Addendum)
Pt oxygen saturation on room air was 88-90% RN inquired if pt wears home O2. Pt said he is supposed to but he had to return it because he couldn't afford it anymore. Pt placed on 2l [place]

## 2019-09-09 NOTE — ED Provider Notes (Signed)
[facility] EMERGENCY DEPARTMENT Provider Note   CSN: [id number] Arrival date & time: 09/09/19  2992   History Chief Complaint  Patient presents with  . Back Pain    Bruce Barrera is a 68 y.o. male.  The history is provided by the patient.  He has history of hyperlipidemia, coronary artery disease, stroke, systolic heart failure and was recently diagnosed with an abdominal aneurysm.  He had onset about 8 PM of pain in his lower abdomen and back.  Pain was severe and he rated it at 8/10.  Nothing made it better, nothing made it worse.  There was some mild nausea but no vomiting.  He has noted some slight difficulty with urination.  Pain has subsided somewhat.  He has not done anything to treat his pain.  Past Medical History:  Diagnosis Date  . Arthritis    Hips and knees  . Back pain   . Chronic systolic CHF (congestive heart failure) (HCC)   . Coronary artery disease    a. 03/2016 subacute anterior MI -> 100% prox LAD >72 hours out thus treated medically, LVEF 30-35% initially  . Essential hypertension   . GERD (gastroesophageal reflux disease)   . Hemorrhoids   . History of stroke    States "stroke" with left sided weakness 5/95 in prison in [place]  . Hyperlipidemia   . Hypothyroidism   . Ischemic cardiomyopathy   . MI (myocardial infarction) (HCC)    States "heart attack" 2/95 in prison in [place]  . Obesity   . Peptic ulcer    GIB 1994  . Sleep apnea   . Vitamin D deficiency disease 06/19/2019    Patient Active Problem List   Diagnosis Date Noted  . Hypothyroidism, adult 06/19/2019  . Vitamin D deficiency disease 06/19/2019  . Diplopia 04/07/2018    Class: Chronic  . Strabismus 04/07/2018    Class: Chronic  . Non-acute transmural anterior myocardial infarction within last eight weeks 03/15/2016  . Pain in the chest   . Chest pain 03/13/2016  . COPD exacerbation (HCC) 03/13/2016  . Exertional angina (HCC) 12/06/2010  . Coronary atherosclerosis of native coronary  artery 12/06/2010  . Essential hypertension, benign 12/06/2010  . Mixed hyperlipidemia 12/06/2010  . Tobacco use disorder 12/06/2010  . Morbid obesity (HCC) 12/06/2010    Past Surgical History:  Procedure Laterality Date  . ADJUSTABLE SUTURE MANIPULATION Left 04/10/2018   Procedure: ADJUSTABLE SUTURE MANIPULATION;  Surgeon: Aura Camps, MD;  Location: [facility] OR;  Service: Ophthalmology;  Laterality: Left;  . CARDIAC CATHETERIZATION N/A 03/15/2016   Procedure: Right/Left Heart Cath and Coronary Angiography;  Surgeon: Iran Ouch, MD;  Location: [facility] INVASIVE CV LAB;  Service: Cardiovascular;  Laterality: N/A;  . COLONOSCOPY  03/23/2011   hemorrhoids  . KNEE ARTHROSCOPY     Right knee x 2  . MEDIAN RECTUS REPAIR Left 04/10/2018   Procedure: LEFT MEDIAN RECTUS REPAIR;  Surgeon: Aura Camps, MD;  Location: [facility] OR;  Service: Ophthalmology;  Laterality: Left;  Marland Kitchen MUSCLE RECESSION AND RESECTION Left 04/10/2018   Procedure: LEFT MUSCLE RECESSION;  Surgeon: Aura Camps, MD;  Location: [facility] OR;  Service: Ophthalmology;  Laterality: Left;  . TOTAL HIP ARTHROPLASTY Right 2014       Family History  Problem Relation Age of Onset  . Hypertension Mother   . Diabetes Mother   . Heart disease Mother   . Stroke Mother   . Stroke Father   . Heart attack Brother   . Aneurysm Brother   .  Cirrhosis Brother     Social History   Tobacco Use  . Smoking status: Current Every Day Smoker    Packs/day: 2.00    Types: Cigarettes    Start date: 05/18/1961  . Smokeless tobacco: Never Used  Substance Use Topics  . Alcohol use: No  . Drug use: No    Home Medications Prior to Admission medications   Medication Sig Start Date End Date Taking? Authorizing Provider  aspirin 81 MG tablet Take 81 mg by mouth at bedtime.     [provider]  atorvastatin (LIPITOR) 80 MG tablet Take 1 tablet (80 mg total) by mouth daily. 06/25/19   Wilson SingerGosrani, Nimish C, MD  carvedilol (COREG) 3.125 MG tablet  Take 1 tablet (3.125 mg total) by mouth 2 (two) times daily with a meal. 06/25/19   Gosrani, Nimish C, MD  clopidogrel (PLAVIX) 75 MG tablet Take 1 tablet (75 mg total) by mouth daily. 06/25/19   Wilson SingerGosrani, Nimish C, MD  levothyroxine (SYNTHROID) 100 MCG tablet Take 1 tablet (100 mcg total) by mouth daily before breakfast. 06/25/19   Karilyn CotaGosrani, Nimish C, MD  pantoprazole (PROTONIX) 40 MG tablet Take 1 tablet (40 mg total) by mouth at bedtime. 06/25/19   Wilson SingerGosrani, Nimish C, MD  sacubitril-valsartan (ENTRESTO) 24-26 MG Take 1 tablet by mouth 2 (two) times daily. 06/25/19   Wilson SingerGosrani, Nimish C, MD  triamterene-hydrochlorothiazide (MAXZIDE-25) 37.5-25 MG tablet Take 1 tablet by mouth daily at 12 noon.  12/07/17   [provider]    Allergies    Patient has no known allergies.  Review of Systems   Review of Systems  All other systems reviewed and are negative.   Physical Exam Updated Vital Signs BP 102/78 (BP Location: Left Arm)   Pulse 95   Temp 97.6 F (36.4 C) (Oral)   Resp 18   Ht 5\' 11"  (1.803 m)   Wt 120.2 kg   SpO2 95%   BMI 36.96 kg/m   Physical Exam Vitals and nursing note reviewed.   Obese 68 year old male, resting comfortably and in no acute distress. Vital signs are normal. Oxygen saturation is 95%, which is normal. Head is normocephalic and atraumatic. PERRLA, EOMI. Oropharynx is clear. Neck is nontender and supple without adenopathy or JVD. Back is nontender in the midline.  There is mild left CVA tenderness. Lungs are clear without rales, wheezes, or rhonchi. Chest is nontender. Heart has regular rate and rhythm without murmur. Abdomen is soft, protuberant, with minimal tenderness in the epigastric area and lower abdomen, mild to moderate tenderness in the left lower quadrant.there is no rebound or guarding.  There are no masses or hepatosplenomegaly and peristalsis is hypoactive. Extremities have 1+ edema, full range of motion is present.  Moderate venous stasis  changes are present. Skin is warm and dry without rash. Neurologic: Mental status is normal, cranial nerves are intact, there are no motor or sensory deficits.  ED Results / Procedures / Treatments   Labs (all labs ordered are listed, but only abnormal results are displayed) Labs Reviewed  COMPREHENSIVE METABOLIC PANEL - Abnormal; Notable for the following components:      Result Value   Glucose, Bld 237 (*)    Calcium 8.7 (*)    All other components within normal limits  CBC WITH DIFFERENTIAL/PLATELET - Abnormal; Notable for the following components:   WBC 15.7 (*)    Platelets 133 (*)    Neutro Abs 14.7 (*)    Lymphs Abs 0.4 (*)  Abs Immature Granulocytes 0.11 (*)    All other components within normal limits  URINALYSIS, ROUTINE W REFLEX MICROSCOPIC - Abnormal; Notable for the following components:   APPearance HAZY (*)    Glucose, UA 50 (*)    Ketones, ur 5 (*)    Protein, ur 30 (*)    All other components within normal limits  LIPASE, BLOOD   Radiology No results found.  Procedures Procedures  Medications Ordered in ED Medications  iohexol (OMNIPAQUE) 350 MG/ML injection 100 mL (has no administration in time range)  ondansetron (ZOFRAN) injection 4 mg (4 mg Intravenous Given 09/09/19 0616)  morphine 4 MG/ML injection 4 mg (4 mg Intravenous Given 09/09/19 0617)    ED Course  I have reviewed the triage vital signs and the nursing notes.  Pertinent labs & imaging results that were available during my care of the patient were reviewed by me and considered in my medical decision making (see chart for details).  MDM Rules/Calculators/A&P Abdominal and left flank pain in patient with known history of abdominal aneurysm.  Old records are reviewed, and aortic ultrasound on August 29, 2019 showed abdominal aneurysm 3.3 cm proximally, 5.3 cm in mid region, 4.0 cm distal.  Given his known history of aneurysm, there is certainly concern for aortic dissection and he will be  sent for CT angiogram dissection protocol.  Other diagnostic possibilities include urolithiasis, urinary tract infection, diverticulitis.  Labs show normal renal function, normal hemoglobin of 14.6 although it is significantly less than on April 10, 2018 (18.3) (also significantly less and value of 16.9 on 07/24/19/2019 in the [place] [facility]).  Urinalysis does not show any hematuria or pyuria.  CT angiogram is pending.  Case is signed out to Dr. Melina Copa.  Final Clinical Impression(s) / ED Diagnoses Final diagnoses:  Left flank pain  History of abdominal aortic aneurysm (AAA)    Rx / DC Orders ED Discharge Orders    None       Delora Fuel, MD 50/35/46 0710

## 2019-09-09 NOTE — Anesthesia Preprocedure Evaluation (Addendum)
Anesthesia Evaluation  Patient identified by MRN, date of birth, ID band Patient awake    Reviewed: Allergy & Precautions, NPO status , Patient's Chart, lab work & pertinent test results  History of Anesthesia Complications Negative for: history of anesthetic complications  Airway Mallampati: III  TM Distance: >3 FB Neck ROM: Full    Dental no notable dental hx. (+) Dental Advisory Given   Pulmonary sleep apnea , COPD, Current Smoker,    Pulmonary exam normal        Cardiovascular hypertension, Pt. on home beta blockers and Pt. on medications + angina + CAD, + Past MI and +CHF   Rhythm:Regular Rate:Tachycardia  Study Conclusions  - Left ventricle: The cavity size was normal. Wall thickness was   increased in a pattern of mild LVH. Systolic function was normal.   The estimated ejection fraction was in the range of 55% to 60%.   Wall motion was normal; there were no regional wall motion   abnormalities. Doppler parameters are consistent with abnormal   left ventricular relaxation (grade 1 diastolic dysfunction). - Aortic valve: Mildly calcified annulus. Trileaflet; normal   thickness leaflets. Valve area (VTI): 2.7 cm^2. Valve area   (Vmax): 2.81 cm^2. Valve area (Vmean): 2.45 cm^2. - Mitral valve: Mildly calcified annulus. Normal thickness leaflets   . - Atrial septum: No defect or patent foramen ovale was identified.    Neuro/Psych negative neurological ROS  negative psych ROS   GI/Hepatic Neg liver ROS, PUD, GERD  ,  Endo/Other  Hypothyroidism   Renal/GU negative Renal ROS     Musculoskeletal  (+) Arthritis ,   Abdominal   Peds  Hematology   Anesthesia Other Findings   Reproductive/Obstetrics                            Anesthesia Physical  Anesthesia Plan  ASA: IV and emergent  Anesthesia Plan: MAC   Post-op Pain Management:    Induction:   PONV Risk Score and Plan: 2  and Ondansetron and Midazolam  Airway Management Planned: Natural Airway and Simple Face Mask  Additional Equipment: Arterial line and CVP  Intra-op Plan:   Post-operative Plan:   Informed Consent: I have reviewed the patients History and Physical, chart, labs and discussed the procedure including the risks, benefits and alternatives for the proposed anesthesia with the patient or authorized representative who has indicated his/her understanding and acceptance.     Dental advisory given  Plan Discussed with: CRNA, Anesthesiologist and Surgeon  Anesthesia Plan Comments:        Anesthesia Quick Evaluation

## 2019-09-09 NOTE — Progress Notes (Signed)
Received call from blood bank regarding duplicate type and screen order received.  RN discussed with blood bank the best practice advisory that is popping up in Epic stating that the type and screen was done at another facility and to reorder it at this location and this may be why the type and screen was ordered.  Blood bank confirmed that a type and screen has been completed here at [facility].  Unsure why best practice advisory alert continues to pop up but additional type and screen is not necessary.

## 2019-09-09 NOTE — ED Provider Notes (Signed)
Signout from Dr. Roxanne Mins.  68 year old male recent diagnosis of abdominal aortic aneurysm here with left flank pain.  Patient has a CTA pending to follow-up on.  Disposition per results of CT. Physical Exam  BP 105/80   Pulse 93   Temp 97.6 F (36.4 C) (Oral)   Resp 20   Ht 5\' 11"  (1.803 m)   Wt 120.2 kg   SpO2 95%   BMI 36.96 kg/m   Physical Exam  ED Course/Procedures     .Critical Care Performed by: Hayden Rasmussen, MD Authorized by: Hayden Rasmussen, MD   Critical care provider statement:    Critical care time (minutes):  30   Critical care time was exclusive of:  Separately billable procedures and treating other patients   Critical care was necessary to treat or prevent imminent or life-threatening deterioration of the following conditions:  Circulatory failure   Critical care was time spent personally by me on the following activities:  Discussions with consultants, evaluation of patient's response to treatment, examination of patient, ordering and performing treatments and interventions, ordering and review of laboratory studies, ordering and review of radiographic studies, pulse oximetry, re-evaluation of patient's condition, obtaining history from patient or surrogate, review of old charts and development of treatment plan with patient or surrogate    MDM  8 AM received call from radiology that the patient has a ruptured AAA.  Asked the nurses to place a second IV, order type and screen, placed consult into vascular.  Discussed with Dr. Donzetta Matters vascular at [facility].  He is recommending emergent transfer to [facility] to the OR for procedure.  Covid testing obtained blood ordered.  [facility] here for transport.        Hayden Rasmussen, MD 09/09/19 1255

## 2019-09-09 NOTE — H&P (Signed)
H+P    History of Present Illness: This is a 68 y.o. male with known 5.3 cm aneurysm from recent abdominal aortic screening ultrasound.  He presented to [facility] ED today with severe onset abdominal pain 8 out of 10 started 8 PM last night.  Has never had pain like this. He is followed by Dr. Domenic Polite for previous cardiac issues.  No blood thinners.  On aspirin Plavix and statin.  Past Medical History:  Diagnosis Date  . Arthritis    Hips and knees  . Back pain   . Chronic systolic CHF (congestive heart failure) ([place])   . Coronary artery disease    a. 03/2016 subacute anterior MI -> 100% prox LAD >72 hours out thus treated medically, LVEF 30-35% initially  . Essential hypertension   . GERD (gastroesophageal reflux disease)   . Hemorrhoids   . History of stroke    States "stroke" with left sided weakness 5/95 in prison in [place]  . Hyperlipidemia   . Hypothyroidism   . Ischemic cardiomyopathy   . MI (myocardial infarction) ([place])    States "heart attack" 2/95 in prison in [place]  . Obesity   . Peptic ulcer    GIB 1994  . Sleep apnea   . Vitamin D deficiency disease 06/19/2019    Past Surgical History:  Procedure Laterality Date  . ADJUSTABLE SUTURE MANIPULATION Left 04/10/2018   Procedure: ADJUSTABLE SUTURE MANIPULATION;  Surgeon: Gevena Cotton, MD;  Location: [place];  Service: Ophthalmology;  Laterality: Left;  . CARDIAC CATHETERIZATION N/A 03/15/2016   Procedure: Right/Left Heart Cath and Coronary Angiography;  Surgeon: Wellington Hampshire, MD;  Location: [facility];  Service: Cardiovascular;  Laterality: N/A;  . COLONOSCOPY  03/23/2011   hemorrhoids  . KNEE ARTHROSCOPY     Right knee x 2  . MEDIAN RECTUS REPAIR Left 04/10/2018   Procedure: LEFT MEDIAN RECTUS REPAIR;  Surgeon: Gevena Cotton, MD;  Location: [place];  Service: Ophthalmology;  Laterality: Left;  Marland Kitchen MUSCLE RECESSION AND RESECTION Left 04/10/2018   Procedure: LEFT MUSCLE RECESSION;  Surgeon: Gevena Cotton, MD;   Location: [place];  Service: Ophthalmology;  Laterality: Left;  . TOTAL HIP ARTHROPLASTY Right 2014    No Known Allergies  Prior to Admission medications   Medication Sig Start Date End Date Taking? Authorizing Provider  aspirin 81 MG tablet Take 81 mg by mouth at bedtime.     [provider]  atorvastatin (LIPITOR) 80 MG tablet Take 1 tablet (80 mg total) by mouth daily. 06/25/19   Doree Albee, MD  carvedilol (COREG) 3.125 MG tablet Take 1 tablet (3.125 mg total) by mouth 2 (two) times daily with a meal. 06/25/19   Gosrani, Nimish C, MD  clopidogrel (PLAVIX) 75 MG tablet Take 1 tablet (75 mg total) by mouth daily. 06/25/19   Doree Albee, MD  levothyroxine (SYNTHROID) 100 MCG tablet Take 1 tablet (100 mcg total) by mouth daily before breakfast. 06/25/19   Anastasio Champion, Nimish C, MD  pantoprazole (PROTONIX) 40 MG tablet Take 1 tablet (40 mg total) by mouth at bedtime. 06/25/19   Doree Albee, MD  sacubitril-valsartan (ENTRESTO) 24-26 MG Take 1 tablet by mouth 2 (two) times daily. 06/25/19   Doree Albee, MD  triamterene-hydrochlorothiazide (MAXZIDE-25) 37.5-25 MG tablet Take 1 tablet by mouth daily at 12 noon.  12/07/17   [provider]    Social History   Socioeconomic History  . Marital status: Divorced    Spouse name:  Not on file  . Number of children: Not on file  . Years of education: Not on file  . Highest education level: Not on file  Occupational History  . Occupation: Disabled    Comment: [employer]  Tobacco Use  . Smoking status: Current Every Day Smoker    Packs/day: 2.00    Types: Cigarettes    Start date: 05/18/1961  . Smokeless tobacco: Never Used  Substance and Sexual Activity  . Alcohol use: No  . Drug use: No  . Sexual activity: Not on file  Other Topics Concern  . Not on file  Social History Narrative   Lives with girlfriend for last 8 years.On disability .   Social Determinants of Health   Financial Resource Strain:   .  Difficulty of Paying Living Expenses: Not on file  Food Insecurity:   . Worried About Programme researcher, broadcasting/film/videounning Out of Food in the Last Year: Not on file  . Ran Out of Food in the Last Year: Not on file  Transportation Needs:   . Lack of Transportation (Medical): Not on file  . Lack of Transportation (Non-Medical): Not on file  Physical Activity:   . Days of Exercise per Week: Not on file  . Minutes of Exercise per Session: Not on file  Stress:   . Feeling of Stress : Not on file  Social Connections:   . Frequency of Communication with Friends and Family: Not on file  . Frequency of Social Gatherings with Friends and Family: Not on file  . Attends Religious Services: Not on file  . Active Member of Clubs or Organizations: Not on file  . Attends BankerClub or Organization Meetings: Not on file  . Marital Status: Not on file  Intimate Partner Violence:   . Fear of Current or Ex-Partner: Not on file  . Emotionally Abused: Not on file  . Physically Abused: Not on file  . Sexually Abused: Not on file    Family History  Problem Relation Age of Onset  . Hypertension Mother   . Diabetes Mother   . Heart disease Mother   . Stroke Mother   . Stroke Father   . Heart attack Brother   . Aneurysm Brother   . Cirrhosis Brother     ROS: Back and abdominal pain   Physical Examination  Vitals:   09/09/19 0700 09/09/19 0730  BP: 107/79 97/71  Pulse: 94 96  Resp: 19 18  Temp:    SpO2: 96% 95%   Body mass index is 36.96 kg/m.  General: Patient is in moderate distress but is awake and alert Pulmonary: normal non-labored breathing Cardiac: Palpable radial pulses Abdomen: Soft but tender to palpation throughout Extremities: Legs appear well-perfused Musculoskeletal: no muscle wasting or atrophy  Neurologic: A&O X 3;   CBC    Component Value Date/Time   WBC 15.7 (H) 09/09/2019 0537   RBC 4.41 09/09/2019 0537   HGB 14.6 09/09/2019 0537   HCT 44.0 09/09/2019 0537   PLT 133 (L) 09/09/2019 0537    MCV 99.8 09/09/2019 0537   MCH 33.1 09/09/2019 0537   MCHC 33.2 09/09/2019 0537   RDW 14.0 09/09/2019 0537   LYMPHSABS 0.4 (L) 09/09/2019 0537   MONOABS 0.5 09/09/2019 0537   EOSABS 0.0 09/09/2019 0537   BASOSABS 0.0 09/09/2019 0537    BMET    Component Value Date/Time   NA 139 09/09/2019 0537   K 4.1 09/09/2019 0537   CL 100 09/09/2019 0537   CO2 28 09/09/2019 0537  GLUCOSE 237 (H) 09/09/2019 0537   BUN 19 09/09/2019 0537   CREATININE 0.97 09/09/2019 0537   CREATININE 0.91 06/19/2019 1522   CALCIUM 8.7 (L) 09/09/2019 0537   GFRNONAA >60 09/09/2019 0537   GFRNONAA 87 06/19/2019 1522   GFRAA >60 09/09/2019 0537   GFRAA 101 06/19/2019 1522    COAGS: Lab Results  Component Value Date   INR 1.0 09/09/2019   INR 1.00 04/10/2018   INR 1.07 03/15/2016     Non-Invasive Vascular Imaging:    CTA chest/abdomen/pelvis IMPRESSION: 1. Ruptured abdominal aortic aneurysm as described above. There is early enhancement of the adjacent IVC, possible fistulous connection. 2. Incidental findings noted above.   ASSESSMENT/PLAN: This is a 68 y.o. male here with ruptured abdominal aortic aneurysm.  Plan will be for endovascular versus open repair.  Patient does have a girlfriend who is his landlord also has a sister who he just to make decisions for him in the event that he cannot.  I discussed the risks of this operation including the risk of death and cardiac issues given extensive cardiovascular history.    Glora Hulgan C. Randie Heinz, MD [facility] [place] Office: [phone] Pager: [phone]

## 2019-09-10 ENCOUNTER — Encounter: Payer: Self-pay | Admitting: *Deleted

## 2019-09-10 LAB — CBC
HCT: 30.6 % — ABNORMAL LOW (ref 39.0–52.0)
Hemoglobin: 10.2 g/dL — ABNORMAL LOW (ref 13.0–17.0)
MCH: 33.8 pg (ref 26.0–34.0)
MCHC: 33.3 g/dL (ref 30.0–36.0)
MCV: 101.3 fL — ABNORMAL HIGH (ref 80.0–100.0)
Platelets: 82 10*3/uL — ABNORMAL LOW (ref 150–400)
RBC: 3.02 MIL/uL — ABNORMAL LOW (ref 4.22–5.81)
RDW: 14.5 % (ref 11.5–15.5)
WBC: 12.8 10*3/uL — ABNORMAL HIGH (ref 4.0–10.5)
nRBC: 0 % (ref 0.0–0.2)

## 2019-09-10 LAB — BASIC METABOLIC PANEL
Anion gap: 7 (ref 5–15)
BUN: 12 mg/dL (ref 8–23)
CO2: 26 mmol/L (ref 22–32)
Calcium: 7.8 mg/dL — ABNORMAL LOW (ref 8.9–10.3)
Chloride: 104 mmol/L (ref 98–111)
Creatinine, Ser: 0.83 mg/dL (ref 0.61–1.24)
GFR calc Af Amer: >60 mL/min (ref >60)
GFR calc non Af Amer: >60 mL/min (ref >60)
Glucose, Bld: 114 mg/dL — ABNORMAL HIGH (ref 70–99)
Potassium: 4.1 mmol/L (ref 3.5–5.1)
Sodium: 137 mmol/L (ref 135–145)

## 2019-09-10 MED ORDER — SODIUM CHLORIDE 0.9 % IV SOLN: INTRAVENOUS | Status: DC

## 2019-09-10 NOTE — Discharge Instructions (Signed)
  [facility] [place]   Discharge Instructions  Endovascular Aortic Aneurysm Repair  Please refer to the following instructions for your post-procedure care. Your surgeon or Physician Assistant will discuss any changes with you.  Activity  You are encouraged to walk as much as you can. You can slowly return to normal activities but must avoid strenuous activity and heavy lifting until your doctor tells you it's OK. Avoid activities such as vacuuming or swinging a gold club. It is normal to feel tired for several weeks after your surgery. Do not drive until your doctor gives the OK and you are no longer taking prescription pain medications. It is also normal to have difficulty with sleep habits, eating, and bowel movements after surgery. These will go away with time.  Bathing/Showering  Shower daily after you go home.  Do not soak in a bathtub, hot tub, or swim until the incision heals completely.  If you have incisions in your groin, wash the groin wounds with soap and water daily and pat dry. (No tub bath-only shower)  Then put a dry gauze or washcloth there to keep this area dry to help prevent wound infection daily and as needed.  Do not use Vaseline or neosporin on your incisions.  Only use soap and water on your incisions and then protect and keep dry.  Incision Care  Shower every day. Clean your incision with mild soap and water. Pat the area dry with a clean towel. You do not need a bandage unless otherwise instructed. Do not apply any ointments or creams to your incision. If you clothing is irritating, you may cover your incision with a dry gauze pad.  Diet  Resume your normal diet. There are no special food restrictions following this procedure. A low fat/low cholesterol diet is recommended for all patients with vascular disease. In order to heal from your surgery, it is CRITICAL to get adequate nutrition. Your body requires vitamins, minerals, and protein.  Vegetables are the best source of vitamins and minerals. Vegetables also provide the perfect balance of protein. Processed food has little nutritional value, so try to avoid this.  Medications  Resume taking all of your medications unless your doctor or nurse practitioner tells you not to. If your incision is causing pain, you may take over-the-counter pain relievers such as acetaminophen (Tylenol). If you were prescribed a stronger pain medication, please be aware these medications can cause nausea and constipation. Prevent nausea by taking the medication with a snack or meal. Avoid constipation by drinking plenty of fluids and eating foods with a high amount of fiber, such as fruits, vegetables, and grains.  Do not take Tylenol if you are taking prescription pain medications.   Follow up  Our office will schedule a follow-up appointment with a CT scan 3-4 weeks after your surgery.  Please call us immediately for any of the following conditions  Severe or worsening pain in your legs or feet or in your abdomen back or chest. Increased pain, redness, drainage (pus) from your incision site. Increased abdominal pain, bloating, nausea, vomiting or persistent diarrhea. Fever of 101 degrees or higher. Swelling in your leg (s),  Reduce your risk of vascular disease  Stop smoking. If you would like help call [facility] at 1-800-QUIT-NOW (1-800-784-8669) or [facility] at [phone]. Manage your cholesterol Maintain a desired weight Control your diabetes Keep your blood pressure down  If you have questions, please call the office at [phone].  

## 2019-09-10 NOTE — Progress Notes (Addendum)
Pt received from [facility]. Oriented to unit and room. CHG wipes applied. Pt connected to tele box and CCMD called. VSS. Call bell in reach, Pt denies needs at this time.  Arletta Bale, RN

## 2019-09-10 NOTE — Progress Notes (Addendum)
Progress Note    09/10/2019 7:44 AM 1 Day Post-Op  Subjective:  Feels good this morning-just a little tired  Afebrile HR 80's-100's NSR 05'L-976'B systolic 34% RA  Vitals:   09/10/19 0600 09/10/19 0713  BP: 117/61 128/65  Pulse: 93 (!) 101  Resp:  (!) 23  Temp:    SpO2: 98% 96%    Physical Exam: Cardiac:  regular Lungs:  Non labored Incisions:  Bilateral groins soft and clean without hematoma Extremities:  Palpable DP pulses bilaterally Abdomen:  Soft, NT/ND  CBC    Component Value Date/Time   WBC 12.8 (H) 09/10/2019 0408   RBC 3.02 (L) 09/10/2019 0408   HGB 10.2 (L) 09/10/2019 0408   HCT 30.6 (L) 09/10/2019 0408   PLT 82 (L) 09/10/2019 0408   MCV 101.3 (H) 09/10/2019 0408   MCH 33.8 09/10/2019 0408   MCHC 33.3 09/10/2019 0408   RDW 14.5 09/10/2019 0408   LYMPHSABS 0.4 (L) 09/09/2019 0537   MONOABS 0.5 09/09/2019 0537   EOSABS 0.0 09/09/2019 0537   BASOSABS 0.0 09/09/2019 0537    BMET    Component Value Date/Time   NA 137 09/10/2019 0408   K 4.1 09/10/2019 0408   CL 104 09/10/2019 0408   CO2 26 09/10/2019 0408   GLUCOSE 114 (H) 09/10/2019 0408   BUN 12 09/10/2019 0408   CREATININE 0.83 09/10/2019 0408   CREATININE 0.91 06/19/2019 1522   CALCIUM 7.8 (L) 09/10/2019 0408   GFRNONAA >60 09/10/2019 0408   GFRNONAA 87 06/19/2019 1522   GFRAA >60 09/10/2019 0408   GFRAA 101 06/19/2019 1522    INR    Component Value Date/Time   INR 1.0 09/09/2019 0811     Intake/Output Summary (Last 24 hours) at 09/10/2019 0744 Last data filed at 09/10/2019 0700 Gross per 24 hour  Intake 3895.17 ml  Output 1635 ml  Net 2260.17 ml     Assessment:  68 y.o. male is s/p:  1.  Bilateral percutaneous common femoral access and closure 2.  Abdominal aortic endograft doing of ruptured aneurysm with 31 x 14 x 13 cm main body left extended with 20 x 9.5 cm device contralateral limb right 20 x 13.5 cm and cuff of aortic neck 32 x 4.5cm 3.  Left hypogastric  embolization with 10 mm Nestor coils x2 4.  Selective catheterization left hypogastric artery  1 Day Post-Op  Plan: -Vascular: Palpable DP pulses bilaterally. -Cardiac:  Hemodynamically stable this am & not requiring any pressors. -Incisions:  Bilateral groins look good and soft.  Dry dressing to bilateral groins.   -GI:  abdomen is soft-no nausea & tolerating clear diet-will advance diet to soft diet -Pulmonary:  96% oxygen saturation.  Wean O2 -GU:  foley out yesterday and pt voiding.  Pt did received increased contrast yesterday.  Renal function remains normal.  Will check labs in am. -Heme/ID:  Thrombocytopenia-will recheck labs tomorrow; ? Hold sq heparin.  Will d/w Dr. Donzetta Matters.  leukocytosis - slightly elevated-probably related to rupture and the peri-op period.  Pt is afebrile.   -Neuro:  In tact -General: doing well this am.  will mobilize out of bed today.   -DVT prophylaxis:  Sq heparin - may want to hold given thrombocytopenia.    Transfer to [place] today.  Given the amount of contrast and he has rupture AAA, will keep unitil tomorrow and check labs in am.    Leontine Locket, PA-C [facility] [phone] 09/10/2019 7:44 AM  I have interviewed and examined patient with PA and agree with assessment and plan above.  Appears to be progressing well.  Transfer to floor we will check labs in the morning.  Will need bilateral popliteal duplex as outpatient on follow-up.  Louna Rothgeb C. Randie Heinz, MD [facility] [place] Office: [phone] Pager: [phone]

## 2019-09-11 LAB — TYPE AND SCREEN
ABO/RH(D): O POS
Antibody Screen: NEGATIVE
Unit division: 0
Unit division: 0
Unit division: 0
Unit division: 0
Unit division: 0
Unit division: 0

## 2019-09-11 LAB — BASIC METABOLIC PANEL
Anion gap: 8 (ref 5–15)
BUN: 10 mg/dL (ref 8–23)
CO2: 29 mmol/L (ref 22–32)
Calcium: 8.1 mg/dL — ABNORMAL LOW (ref 8.9–10.3)
Chloride: 99 mmol/L (ref 98–111)
Creatinine, Ser: 0.92 mg/dL (ref 0.61–1.24)
GFR calc Af Amer: >60 mL/min (ref >60)
GFR calc non Af Amer: >60 mL/min (ref >60)
Glucose, Bld: 187 mg/dL — ABNORMAL HIGH (ref 70–99)
Potassium: 4 mmol/L (ref 3.5–5.1)
Sodium: 136 mmol/L (ref 135–145)

## 2019-09-11 LAB — BPAM RBC
Blood Product Expiration Date: 202101282359
Blood Product Expiration Date: 202101282359
Blood Product Expiration Date: 202101282359
Blood Product Expiration Date: 202101282359
Blood Product Expiration Date: 202101292359
Blood Product Expiration Date: 202101292359
ISSUE DATE / TIME: 202012290857
ISSUE DATE / TIME: 202012290857
ISSUE DATE / TIME: 202012290857
ISSUE DATE / TIME: 202012290857
Unit Type and Rh: 5100
Unit Type and Rh: 5100
Unit Type and Rh: 5100
Unit Type and Rh: 5100
Unit Type and Rh: 5100
Unit Type and Rh: 5100

## 2019-09-11 LAB — CBC
HCT: 30.1 % — ABNORMAL LOW (ref 39.0–52.0)
Hemoglobin: 10.2 g/dL — ABNORMAL LOW (ref 13.0–17.0)
MCH: 33.7 pg (ref 26.0–34.0)
MCHC: 33.9 g/dL (ref 30.0–36.0)
MCV: 99.3 fL (ref 80.0–100.0)
Platelets: 74 10*3/uL — ABNORMAL LOW (ref 150–400)
RBC: 3.03 MIL/uL — ABNORMAL LOW (ref 4.22–5.81)
RDW: 14 % (ref 11.5–15.5)
WBC: 11.3 10*3/uL — ABNORMAL HIGH (ref 4.0–10.5)
nRBC: 0 % (ref 0.0–0.2)

## 2019-09-11 NOTE — Discharge Summary (Signed)
EVAR Discharge Summary   Bruce Barrera 1950-10-21 68 y.o. male  MRN: [id number]  Admission Date: 09/09/2019  Discharge Date: 09/11/2019  Physician: Thomes Lolling*  Admission Diagnosis: Left flank pain [R10.9] History of abdominal aortic aneurysm (AAA) [Z86.79] Ruptured abdominal aortic aneurysm [facility]) [I71.3] Ruptured abdominal aortic aneurysm (AAA) [facility]) [I71.3]  Discharge Day services:   See progress note 09/11/2019 Physical Exam: Vitals:   09/11/19 0535 09/11/19 0907  BP: (!) 100/43 118/62  Pulse:  91  Resp: 19   Temp: 98.4 F (36.9 C)   SpO2: 99%     Hospital Course:  The patient was admitted to the hospital and taken to the operating room on 09/09/2019 and underwent: Endovascular repair of ruptured abdominal aortic aneurysm by Dr. Donzetta Matters  The pt tolerated the procedure well and was transported to the PACU in good condition.  He remained in the ICU for about 24 hours and was transferred to stepdown unit.  Renal function remained stable.  Patient is tolerating a regular diet.  He is ambulating without difficulty.  Thrombocytopenia noted on postop labs however this can be followed as an outpatient.  He can continue his aspirin, Plavix, and statin regimen daily.  He will follow-up in office in about 4 weeks with a CTA abdomen pelvis as well as bilateral popliteal artery check to rule out aneurysmal disease.  Discharge instructions were reviewed with the patient and he voices understanding.  He will be discharged home today in stable condition.  CBC    Component Value Date/Time   WBC 11.3 (H) 09/11/2019 0751   RBC 3.03 (L) 09/11/2019 0751   HGB 10.2 (L) 09/11/2019 0751   HCT 30.1 (L) 09/11/2019 0751   PLT 74 (L) 09/11/2019 0751   MCV 99.3 09/11/2019 0751   MCH 33.7 09/11/2019 0751   MCHC 33.9 09/11/2019 0751   RDW 14.0 09/11/2019 0751   LYMPHSABS 0.4 (L) 09/09/2019 0537   MONOABS 0.5 09/09/2019 0537   EOSABS 0.0 09/09/2019 0537   BASOSABS 0.0  09/09/2019 0537    BMET    Component Value Date/Time   NA 136 09/11/2019 0751   K 4.0 09/11/2019 0751   CL 99 09/11/2019 0751   CO2 29 09/11/2019 0751   GLUCOSE 187 (H) 09/11/2019 0751   BUN 10 09/11/2019 0751   CREATININE 0.92 09/11/2019 0751   CREATININE 0.91 06/19/2019 1522   CALCIUM 8.1 (L) 09/11/2019 0751   GFRNONAA >60 09/11/2019 0751   GFRNONAA 87 06/19/2019 1522   GFRAA >60 09/11/2019 0751   GFRAA 101 06/19/2019 1522         Discharge Diagnosis:  Left flank pain [R10.9] History of abdominal aortic aneurysm (AAA) [Z86.79] Ruptured abdominal aortic aneurysm (HCC) [I71.3] Ruptured abdominal aortic aneurysm (AAA) ([place]) [I71.3]  Secondary Diagnosis: Patient Active Problem List   Diagnosis Date Noted  . Ruptured abdominal aortic aneurysm ([place]) 09/09/2019  . Ruptured abdominal aortic aneurysm (AAA) ([place]) 09/09/2019  . Hypothyroidism, adult 06/19/2019  . Vitamin D deficiency disease 06/19/2019  . Diplopia 04/07/2018    Class: Chronic  . Strabismus 04/07/2018    Class: Chronic  . Non-acute transmural anterior myocardial infarction within last eight weeks 03/15/2016  . Pain in the chest   . Chest pain 03/13/2016  . COPD exacerbation ([place]) 03/13/2016  . Exertional angina ([place]) 12/06/2010  . Coronary atherosclerosis of native coronary artery 12/06/2010  . Essential hypertension, benign 12/06/2010  . Mixed hyperlipidemia 12/06/2010  . Tobacco use disorder 12/06/2010  . Morbid obesity ([place]) 12/06/2010  Past Medical History:  Diagnosis Date  . Arthritis    Hips and knees  . Back pain   . Chronic systolic CHF (congestive heart failure) (HCC)   . Coronary artery disease    a. 03/2016 subacute anterior MI -> 100% prox LAD >72 hours out thus treated medically, LVEF 30-35% initially  . Essential hypertension   . GERD (gastroesophageal reflux disease)   . Hemorrhoids   . History of stroke    States "stroke" with left sided weakness 5/95 in prison in [place]  .  Hyperlipidemia   . Hypothyroidism   . Ischemic cardiomyopathy   . MI (myocardial infarction) (HCC)    States "heart attack" 2/95 in prison in [place]  . Obesity   . Peptic ulcer    GIB 1994  . Sleep apnea   . Vitamin D deficiency disease 06/19/2019     Allergies as of 09/11/2019   No Known Allergies     Medication List    TAKE these medications   aspirin 81 MG tablet Take 81 mg by mouth at bedtime.   atorvastatin 80 MG tablet Commonly known as: LIPITOR Take 1 tablet (80 mg total) by mouth daily.   carvedilol 3.125 MG tablet Commonly known as: COREG Take 1 tablet (3.125 mg total) by mouth 2 (two) times daily with a meal.   clopidogrel 75 MG tablet Commonly known as: PLAVIX Take 1 tablet (75 mg total) by mouth daily.   ibuprofen 200 MG tablet Commonly known as: ADVIL Take 800 mg by mouth every 8 (eight) hours.   levothyroxine 100 MCG tablet Commonly known as: Synthroid Take 1 tablet (100 mcg total) by mouth daily before breakfast.   pantoprazole 40 MG tablet Commonly known as: PROTONIX Take 1 tablet (40 mg total) by mouth at bedtime.   sacubitril-valsartan 24-26 MG Commonly known as: ENTRESTO Take 1 tablet by mouth 2 (two) times daily.   triamterene-hydrochlorothiazide 37.5-25 MG tablet Commonly known as: MAXZIDE-25 Take 1 tablet by mouth daily at 12 noon.       Discharge Instructions:   [facility]  Discharge Instructions Endovascular Aortic Aneurysm Repair  Please refer to the following instructions for your post-procedure care. Your surgeon or Physician Assistant will discuss any changes with you.  Activity  You are encouraged to walk as much as you can. You can slowly return to normal activities but must avoid strenuous activity and heavy lifting until your doctor tells you it's OK. Avoid activities such as vacuuming or swinging a gold club. It is normal to feel tired for several weeks after your surgery. Do not drive  until your doctor gives the OK and you are no longer taking prescription pain medications. It is also normal to have difficulty with sleep habits, eating, and bowel movements after surgery. These will go away with time.  Bathing/Showering  You may shower after you go home. If you have an incision, do not soak in a bathtub, hot tub, or swim until the incision heals completely.  Incision Care  Shower every day. Clean your incision with mild soap and water. Pat the area dry with a clean towel. You do not need a bandage unless otherwise instructed. Do not apply any ointments or creams to your incision. If you clothing is irritating, you may cover your incision with a dry gauze pad.  Diet  Resume your normal diet. There are no special food restrictions following this procedure. A low fat/low cholesterol diet is recommended for all  patients with vascular disease. In order to heal from your surgery, it is CRITICAL to get adequate nutrition. Your body requires vitamins, minerals, and protein. Vegetables are the best source of vitamins and minerals. Vegetables also provide the perfect balance of protein. Processed food has little nutritional value, so try to avoid this.  Medications  Resume taking all of your medications unless your doctor or Physician Assistnat tells you not to. If your incision is causing pain, you may take over-the-counter pain relievers such as acetaminophen (Tylenol). If you were prescribed a stronger pain medication, please be aware these medications can cause nausea and constipation. Prevent nausea by taking the medication with a snack or meal. Avoid constipation by drinking plenty of fluids and eating foods with a high amount of fiber, such as fruits, vegetables, and grains. Do not take Tylenol if you are taking prescription pain medications.   Follow up  Our office will schedule a follow-up appointment with a C.T. scan 3-4 weeks after your surgery.  Please call us immediately  for any of the following conditions  Severe or worsening pain in your legs or feet or in your abdomen back or chest. Increased pain, redness, drainage (pus) from your incision sit. Increased abdominal pain, bloating, nausea, vomiting or persistent diarrhea. Fever of 101 degrees or higher. Swelling in your leg (s),  Reduce your risk of vascular disease  .Stop smoking. If you would like help call [facility] at 1-800-QUIT-NOW ((865)664-31091-(778) 803-9274) or [place] at 318-158-5650939-454-0654. .Manage your cholesterol .Maintain a desired weight .Control your diabetes .Keep your blood pressure down  If you have questions, please call the office at [phone].   Disposition: home  Patient's condition: is Good  Follow up: 1. Dr. Randie Heinzain in 4 weeks with CTA protocol and popliteal duplex bilaterally to rule out aneurysmal disease   Emilie RutterMatthew Sanvi Ehler, PA-C [facility] [phone] 09/11/2019  11:49 AM   - For [facility]QI Registry use - Post-op:  Time to Extubation: [x]  In OR, [ ]  < 12 hrs, [ ]  12-24 hrs, [ ]  >=24 hrs Vasopressors Req. Post-op: No MI: No., [ ]  Troponin only, [ ]  EKG or Clinical New Arrhythmia: No CHF: No ICU Stay: 1 day in ICU Transfusion: No   Complications: Resp failure: No., [ ]  Pneumonia, [ ]  Ventilator Chg in renal function: No., [ ]  Inc. Cr > 0.5, [ ]  Temp. Dialysis,  [ ]  Permanent dialysis Leg ischemia: No., no Surgery needed, [ ]  Yes, Surgery needed,  [ ]  Amputation Bowel ischemia: No., [ ]  Medical Rx, [ ]  Surgical Rx Wound complication: No., [ ]  Superficial separation/infection, [ ]  Return to OR Return to OR: No  Return to OR for bleeding: No Stroke: No., [ ]  Minor, [ ]  Major  Discharge medications: Statin use:  Yes  ASA use:  Yes  Plavix use:  Yes  Beta blocker use:  Yes  ARB use:  Yes ACEI use:  No CCB use:  No

## 2019-09-11 NOTE — Progress Notes (Addendum)
  Progress Note    09/11/2019 7:32 AM 2 Days Post-Op  Subjective:  Wants to go home today   Vitals:   09/10/19 2019 09/11/19 0535  BP: 112/63 (!) 100/43  Pulse: 100   Resp: 20 19  Temp: 98.5 F (36.9 C) 98.4 F (36.9 C)  SpO2: 96% 99%   Physical Exam: Lungs:  Non labored Incisions:  Groins soft Extremities:  Palpable ATA pulses bilaterally Neurologic: A&O  CBC    Component Value Date/Time   WBC 12.8 (H) 09/10/2019 0408   RBC 3.02 (L) 09/10/2019 0408   HGB 10.2 (L) 09/10/2019 0408   HCT 30.6 (L) 09/10/2019 0408   PLT 82 (L) 09/10/2019 0408   MCV 101.3 (H) 09/10/2019 0408   MCH 33.8 09/10/2019 0408   MCHC 33.3 09/10/2019 0408   RDW 14.5 09/10/2019 0408   LYMPHSABS 0.4 (L) 09/09/2019 0537   MONOABS 0.5 09/09/2019 0537   EOSABS 0.0 09/09/2019 0537   BASOSABS 0.0 09/09/2019 0537    BMET    Component Value Date/Time   NA 137 09/10/2019 0408   K 4.1 09/10/2019 0408   CL 104 09/10/2019 0408   CO2 26 09/10/2019 0408   GLUCOSE 114 (H) 09/10/2019 0408   BUN 12 09/10/2019 0408   CREATININE 0.83 09/10/2019 0408   CREATININE 0.91 06/19/2019 1522   CALCIUM 7.8 (L) 09/10/2019 0408   GFRNONAA >60 09/10/2019 0408   GFRNONAA 87 06/19/2019 1522   GFRAA >60 09/10/2019 0408   GFRAA 101 06/19/2019 1522    INR    Component Value Date/Time   INR 1.0 09/09/2019 0811     Intake/Output Summary (Last 24 hours) at 09/11/2019 0732 Last data filed at 09/11/2019 0600 Gross per 24 hour  Intake 1200 ml  Output 4405 ml  Net -3205 ml     Assessment/Plan:  68 y.o. male is s/p endovascular repair of ruptured AAA 2 Days Post-Op   BLE well perfused with palpable AT pulses Tolerating diet Good UOP; labs pending Thrombocytopenia: labs pending Pending labs, ok for discharge home; check CTA abd/pelvis in 4 weeks with Dr. Rolanda Lundborg, PA-C [facility] [phone] 09/11/2019 7:32 AM  I have independently interviewed and examined patient  and agree with PA assessment and plan above.  Labs appear satisfactory.  Okay for discharge home.  Eliaz Fout C. Donzetta Matters, MD [facility] [place] Office: [phone] Pager: [phone]

## 2019-09-15 ENCOUNTER — Telehealth (INDEPENDENT_AMBULATORY_CARE_PROVIDER_SITE_OTHER): Payer: Self-pay | Admitting: Internal Medicine

## 2019-09-15 ENCOUNTER — Other Ambulatory Visit: Payer: Self-pay

## 2019-09-15 ENCOUNTER — Emergency Department (HOSPITAL_COMMUNITY)
Admission: EM | Admit: 2019-09-15 | Discharge: 2019-09-15 | Disposition: A | Discharge status: Home / Self Care | Payer: Medicare Other | Attending: Emergency Medicine | Admitting: Emergency Medicine

## 2019-09-15 ENCOUNTER — Encounter (HOSPITAL_COMMUNITY): Payer: Self-pay | Admitting: Emergency Medicine

## 2019-09-15 ENCOUNTER — Emergency Department (HOSPITAL_COMMUNITY): Payer: Medicare Other

## 2019-09-15 DIAGNOSIS — F17210 Nicotine dependence, cigarettes, uncomplicated: Secondary | ICD-10-CM | POA: Insufficient documentation

## 2019-09-15 DIAGNOSIS — R1030 Lower abdominal pain, unspecified: Secondary | ICD-10-CM | POA: Diagnosis not present

## 2019-09-15 DIAGNOSIS — I701 Atherosclerosis of renal artery: Secondary | ICD-10-CM

## 2019-09-15 DIAGNOSIS — Z79899 Other long term (current) drug therapy: Secondary | ICD-10-CM | POA: Insufficient documentation

## 2019-09-15 DIAGNOSIS — Z8673 Personal history of transient ischemic attack (TIA), and cerebral infarction without residual deficits: Secondary | ICD-10-CM | POA: Insufficient documentation

## 2019-09-15 DIAGNOSIS — E039 Hypothyroidism, unspecified: Secondary | ICD-10-CM | POA: Diagnosis not present

## 2019-09-15 DIAGNOSIS — Z7982 Long term (current) use of aspirin: Secondary | ICD-10-CM | POA: Insufficient documentation

## 2019-09-15 DIAGNOSIS — I252 Old myocardial infarction: Secondary | ICD-10-CM | POA: Insufficient documentation

## 2019-09-15 DIAGNOSIS — I110 Hypertensive heart disease with heart failure: Secondary | ICD-10-CM | POA: Diagnosis not present

## 2019-09-15 DIAGNOSIS — I259 Chronic ischemic heart disease, unspecified: Secondary | ICD-10-CM | POA: Diagnosis not present

## 2019-09-15 DIAGNOSIS — R109 Unspecified abdominal pain: Secondary | ICD-10-CM | POA: Diagnosis not present

## 2019-09-15 DIAGNOSIS — I5022 Chronic systolic (congestive) heart failure: Secondary | ICD-10-CM | POA: Diagnosis not present

## 2019-09-15 DIAGNOSIS — Z7902 Long term (current) use of antithrombotics/antiplatelets: Secondary | ICD-10-CM | POA: Diagnosis not present

## 2019-09-15 DIAGNOSIS — Z96641 Presence of right artificial hip joint: Secondary | ICD-10-CM | POA: Diagnosis not present

## 2019-09-15 LAB — CBC WITH DIFFERENTIAL/PLATELET
Abs Immature Granulocytes: 0.12 10*3/uL — ABNORMAL HIGH (ref 0.00–0.07)
Basophils Absolute: 0.1 10*3/uL (ref 0.0–0.1)
Basophils Relative: 0 %
Eosinophils Absolute: 0.2 10*3/uL (ref 0.0–0.5)
Eosinophils Relative: 1 %
HCT: 36.5 % — ABNORMAL LOW (ref 39.0–52.0)
Hemoglobin: 11.7 g/dL — ABNORMAL LOW (ref 13.0–17.0)
Immature Granulocytes: 1 %
Lymphocytes Relative: 4 %
Lymphs Abs: 0.5 10*3/uL — ABNORMAL LOW (ref 0.7–4.0)
MCH: 32.7 pg (ref 26.0–34.0)
MCHC: 32.1 g/dL (ref 30.0–36.0)
MCV: 102 fL — ABNORMAL HIGH (ref 80.0–100.0)
Monocytes Absolute: 0.8 10*3/uL (ref 0.1–1.0)
Monocytes Relative: 6 %
Neutro Abs: 10.9 10*3/uL — ABNORMAL HIGH (ref 1.7–7.7)
Neutrophils Relative %: 88 %
Platelets: 208 10*3/uL (ref 150–400)
RBC: 3.58 MIL/uL — ABNORMAL LOW (ref 4.22–5.81)
RDW: 14.4 % (ref 11.5–15.5)
WBC: 12.6 10*3/uL — ABNORMAL HIGH (ref 4.0–10.5)
nRBC: 0 % (ref 0.0–0.2)

## 2019-09-15 LAB — URINALYSIS, ROUTINE W REFLEX MICROSCOPIC
Bacteria, UA: NONE SEEN
Bilirubin Urine: NEGATIVE
Glucose, UA: NEGATIVE mg/dL
Ketones, ur: 5 mg/dL — AB
Leukocytes,Ua: NEGATIVE
Nitrite: NEGATIVE
Protein, ur: 100 mg/dL — AB
Specific Gravity, Urine: 1.024 (ref 1.005–1.030)
pH: 6 (ref 5.0–8.0)

## 2019-09-15 LAB — BASIC METABOLIC PANEL
Anion gap: 9 (ref 5–15)
BUN: 20 mg/dL (ref 8–23)
CO2: 27 mmol/L (ref 22–32)
Calcium: 8.5 mg/dL — ABNORMAL LOW (ref 8.9–10.3)
Chloride: 100 mmol/L (ref 98–111)
Creatinine, Ser: 1.05 mg/dL (ref 0.61–1.24)
GFR calc Af Amer: >60 mL/min (ref >60)
GFR calc non Af Amer: >60 mL/min (ref >60)
Glucose, Bld: 143 mg/dL — ABNORMAL HIGH (ref 70–99)
Potassium: 3.7 mmol/L (ref 3.5–5.1)
Sodium: 136 mmol/L (ref 135–145)

## 2019-09-15 MED ORDER — IOHEXOL 350 MG/ML SOLN
100.0000 mL | Freq: Once | INTRAVENOUS | Status: AC | PRN
  Administered 2019-09-15: 15:00:00 100 mL via INTRAVENOUS

## 2019-09-15 MED ORDER — ONDANSETRON 4 MG PO TBDP
4.0000 mg | ORAL_TABLET | Freq: Once | ORAL | Status: AC
  Administered 2019-09-15: 4 mg via ORAL
  Filled 2019-09-15: qty 1

## 2019-09-15 MED ORDER — ONDANSETRON 4 MG PO TBDP: 4.0000 mg | ORAL_TABLET | Freq: Three times a day (TID) | ORAL | 0 refills | Status: DC | PRN

## 2019-09-15 NOTE — ED Notes (Signed)
Pt left prior to e-signing. Pt verbalized understanding of discharge instructions and plan of care for followup. nad noted.

## 2019-09-15 NOTE — Discharge Instructions (Signed)
You were seen in the emergency department today with abdominal discomfort.  Your CT scan shows some decreased blood flow to the right kidney which needs follow-up tomorrow with the vascular surgeons.  They will be calling you to schedule an appointment so please be expecting a call.  They would like you to not eat or drink anything after midnight tonight as they plan to do a kidney ultrasound in the morning.  The pain suddenly worsens you develop new/severe symptoms you should return to the emergency department.

## 2019-09-15 NOTE — ED Provider Notes (Signed)
[facility] EMERGENCY DEPARTMENT Provider Note   CSN: [id number] Arrival date & time: 09/15/19  1208     History Chief Complaint  Patient presents with  . Abdominal Pain    Bruce Barrera is a 69 y.o. male.  HPI      69 year old male with abdominal pain.  He is status post repair of ruptured AAA and discharged 09/11/19.  He has had some continued abdominal pain since the surgery.  After he was able to have a good bowel movement on Friday though he did have some significant improvement of his overall symptoms.  The past 2 days he has developed some lower abdominal and lower back pain.  Associated with increased urinary frequency.  No dysuria.  Noticed that his urine has been dark in color.  No nausea or vomiting.  No dizziness or lightheadedness.  Past Medical History:  Diagnosis Date  . Arthritis    Hips and knees  . Back pain   . Chronic systolic CHF (congestive heart failure) ([place])   . Coronary artery disease    a. 03/2016 subacute anterior MI -> 100% prox LAD >72 hours out thus treated medically, LVEF 30-35% initially  . Essential hypertension   . GERD (gastroesophageal reflux disease)   . Hemorrhoids   . History of stroke    States "stroke" with left sided weakness 5/95 in prison in [place]  . Hyperlipidemia   . Hypothyroidism   . Ischemic cardiomyopathy   . MI (myocardial infarction) ([place])    States "heart attack" 2/95 in prison in [place]  . Obesity   . Peptic ulcer    GIB 1994  . Sleep apnea   . Vitamin D deficiency disease 06/19/2019    Patient Active Problem List   Diagnosis Date Noted  . Ruptured abdominal aortic aneurysm ([place]) 09/09/2019  . Ruptured abdominal aortic aneurysm (AAA) ([place]) 09/09/2019  . Hypothyroidism, adult 06/19/2019  . Vitamin D deficiency disease 06/19/2019  . Diplopia 04/07/2018    Class: Chronic  . Strabismus 04/07/2018    Class: Chronic  . Non-acute transmural anterior myocardial infarction within last eight weeks 03/15/2016  . Pain in the  chest   . Chest pain 03/13/2016  . COPD exacerbation ([place]) 03/13/2016  . Exertional angina ([place]) 12/06/2010  . Coronary atherosclerosis of native coronary artery 12/06/2010  . Essential hypertension, benign 12/06/2010  . Mixed hyperlipidemia 12/06/2010  . Tobacco use disorder 12/06/2010  . Morbid obesity ([place]) 12/06/2010    Past Surgical History:  Procedure Laterality Date  . ABDOMINAL AORTIC ENDOVASCULAR STENT GRAFT N/A 09/09/2019   Procedure: ABDOMINAL AORTIC ENDOVASCULAR STENT GRAFT;  Surgeon: Waynetta Sandy, MD;  Location: [place];  Service: Vascular;  Laterality: N/A;  . ADJUSTABLE SUTURE MANIPULATION Left 04/10/2018   Procedure: ADJUSTABLE SUTURE MANIPULATION;  Surgeon: Gevena Cotton, MD;  Location: [place];  Service: Ophthalmology;  Laterality: Left;  . CARDIAC CATHETERIZATION N/A 03/15/2016   Procedure: Right/Left Heart Cath and Coronary Angiography;  Surgeon: Wellington Hampshire, MD;  Location: [place] CV LAB;  Service: Cardiovascular;  Laterality: N/A;  . COLONOSCOPY  03/23/2011   hemorrhoids  . KNEE ARTHROSCOPY     Right knee x 2  . MEDIAN RECTUS REPAIR Left 04/10/2018   Procedure: LEFT MEDIAN RECTUS REPAIR;  Surgeon: Gevena Cotton, MD;  Location: [place];  Service: Ophthalmology;  Laterality: Left;  Marland Kitchen MUSCLE RECESSION AND RESECTION Left 04/10/2018   Procedure: LEFT MUSCLE RECESSION;  Surgeon: Gevena Cotton, MD;  Location: [place];  Service: Ophthalmology;  Laterality: Left;  . TOTAL HIP ARTHROPLASTY Right 2014       Family History  Problem Relation Age of Onset  . Hypertension Mother   . Diabetes Mother   . Heart disease Mother   . Stroke Mother   . Stroke Father   . Heart attack Brother   . Aneurysm Brother   . Cirrhosis Brother     Social History   Tobacco Use  . Smoking status: Current Every Day Smoker    Packs/day: 2.00    Types: Cigarettes    Start date: 05/18/1961  . Smokeless tobacco: Never Used  Substance Use Topics  . Alcohol use: No  .  Drug use: No    Home Medications Prior to Admission medications   Medication Sig Start Date End Date Taking? Authorizing Provider  aspirin 81 MG tablet Take 81 mg by mouth at bedtime.     [provider]  atorvastatin (LIPITOR) 80 MG tablet Take 1 tablet (80 mg total) by mouth daily. 06/25/19   Wilson SingerGosrani, Nimish C, MD  carvedilol (COREG) 3.125 MG tablet Take 1 tablet (3.125 mg total) by mouth 2 (two) times daily with a meal. 06/25/19   Gosrani, Nimish C, MD  clopidogrel (PLAVIX) 75 MG tablet Take 1 tablet (75 mg total) by mouth daily. 06/25/19   Lilly CoveGosrani, Nimish C, MD  ibuprofen (ADVIL) 200 MG tablet Take 800 mg by mouth every 8 (eight) hours.    [provider]  levothyroxine (SYNTHROID) 100 MCG tablet Take 1 tablet (100 mcg total) by mouth daily before breakfast. 06/25/19   Karilyn CotaGosrani, Nimish C, MD  pantoprazole (PROTONIX) 40 MG tablet Take 1 tablet (40 mg total) by mouth at bedtime. 06/25/19   Wilson SingerGosrani, Nimish C, MD  sacubitril-valsartan (ENTRESTO) 24-26 MG Take 1 tablet by mouth 2 (two) times daily. 06/25/19   Wilson SingerGosrani, Nimish C, MD  triamterene-hydrochlorothiazide (MAXZIDE-25) 37.5-25 MG tablet Take 1 tablet by mouth daily at 12 noon.  12/07/17   [provider]    Allergies    Patient has no known allergies.  Review of Systems   Review of Systems All systems reviewed and negative, other than as noted in HPI.  Physical Exam Updated Vital Signs BP 121/78 (BP Location: Right Arm)   Pulse 77   Temp 98.7 F (37.1 C) (Oral)   Resp 18   Ht 5\' 11"  (1.803 m)   Wt 118.8 kg   SpO2 95%   BMI 36.54 kg/m   Physical Exam Vitals and nursing note reviewed.  Constitutional:      General: He is not in acute distress.    Appearance: He is well-developed. He is obese.  HENT:     Head: Normocephalic and atraumatic.  Eyes:     General:        Right eye: No discharge.        Left eye: No discharge.     Conjunctiva/sclera: Conjunctivae normal.  Cardiovascular:      Rate and Rhythm: Normal rate and regular rhythm.     Heart sounds: Normal heart sounds. No murmur. No friction rub. No gallop.   Pulmonary:     Effort: Pulmonary effort is normal. No respiratory distress.     Breath sounds: Normal breath sounds.  Abdominal:     General: There is no distension.     Palpations: Abdomen is soft.     Tenderness: There is no abdominal tenderness.     Comments: Soft.  Mild tenderness across the lower abdomen.  Does  not particularly lateralize.  No rebound or guarding.  Groin surgical sites with some ecchymosis but appear to overall be healing appropriately.  Musculoskeletal:        General: No tenderness.     Cervical back: Neck supple.  Skin:    General: Skin is warm and dry.  Neurological:     Mental Status: He is alert.  Psychiatric:        Behavior: Behavior normal.        Thought Content: Thought content normal.     ED Results / Procedures / Treatments   Labs (all labs ordered are listed, but only abnormal results are displayed) Labs Reviewed  URINALYSIS, ROUTINE W REFLEX MICROSCOPIC - Abnormal; Notable for the following components:      Result Value   Color, Urine AMBER (*)    Hgb urine dipstick SMALL (*)    Ketones, ur 5 (*)    Protein, ur 100 (*)    All other components within normal limits  CBC WITH DIFFERENTIAL/PLATELET - Abnormal; Notable for the following components:   WBC 12.6 (*)    RBC 3.58 (*)    Hemoglobin 11.7 (*)    HCT 36.5 (*)    MCV 102.0 (*)    Neutro Abs 10.9 (*)    Lymphs Abs 0.5 (*)    Abs Immature Granulocytes 0.12 (*)    All other components within normal limits  BASIC METABOLIC PANEL - Abnormal; Notable for the following components:   Glucose, Bld 143 (*)    Calcium 8.5 (*)    All other components within normal limits  URINE CULTURE    EKG None  Radiology VAS US RENAL ARTERY DUPLEX  Result Date: 09/16/2019 ABDOMINAL VISCERAL Indications: Renal artery stenosis High Risk Factors: Hypertension,  hyperlipidemia, current smoker, prior MI,                    coronary artery disease. Vascular Interventions: 09/09/2019 Abdominal aorta endovascular stent graft. Limitations: Obesity, patient discomfort and heavy respirations. Comparison Study: 09/15/2019 - CTA Performing Technologist: Priscella Mann BA, RVT, RDMS  Examination Guidelines: A complete evaluation includes B-mode imaging, spectral Doppler, color Doppler, and power Doppler as needed of all accessible portions of each vessel. Bilateral testing is considered an integral part of a complete examination. Limited examinations for reoccurring indications may be performed as noted.  Duplex Findings: +------------------+--------+--------+-------------------+ Right Renal ArteryPSV cm/sEDV cm/s      Comment       +------------------+--------+--------+-------------------+ Origin                              Not Visualized    +------------------+--------+--------+-------------------+ Proximal                            Not Visualized    +------------------+--------+--------+-------------------+ Mid                                 Not visualized    +------------------+--------+--------+-------------------+ Distal              184           Not well visualized +------------------+--------+--------+-------------------+ +-----------------+--------+--------+-------+ Left Renal ArteryPSV cm/sEDV cm/sComment +-----------------+--------+--------+-------+ Origin             145      32           +-----------------+--------+--------+-------+  Proximal            48      14           +-----------------+--------+--------+-------+ Mid                131      23           +-----------------+--------+--------+-------+ Distal             135      31           +-----------------+--------+--------+-------+  Technologist observations: +------------+--------+--------+----+-----------+--------+--------+----+ Right KidneyPSV cm/sEDV  cm/sRI  Left KidneyPSV cm/sEDV cm/sRI   +------------+--------+--------+----+-----------+--------+--------+----+ Upper Pole                      Upper Pole                      +------------+--------+--------+----+-----------+--------+--------+----+ Mid         22      15      0.        30      9       0.69 +------------+--------+--------+----+-----------+--------+--------+----+ Lower Pole                      Lower Pole                      +------------+--------+--------+----+-----------+--------+--------+----+ Hilar                           Hilar                           +------------+--------+--------+----+-----------+--------+--------+----+ +------------------+-------+------------------+-------+ Right Kidney             Left Kidney               +------------------+-------+------------------+-------+ Cortex thickness  1.14 mmCorex thickness   1.76 mm +------------------+-------+------------------+-------+ Kidney length (cm)13.60  Kidney length (cm)15.10   +------------------+-------+------------------+-------+  Summary:  Technically difficulty and limited exam due to limitations as listed above.  Renal:  Right: RRV flow present. Normal size right kidney. Normal cortical        thickness of right kidney. Unable to definately identify        right renal artery. Left:  Normal size of left kidney. Normal cortical thickness of the        left kidney. No evidence of left renal artery stenosis. LRV        flow present.  *See table(s) above for measurements and observations.  Diagnosing physician: Sherald Hess MD  Electronically signed by Sherald Hess MD on 09/16/2019 at 3:08:49 PM.    Final    CT Angio Abd/Pel W and/or Wo Contrast  Result Date: 09/15/2019 CLINICAL DATA:  69 year old male with a history of abdominal pain and lower back pain. Previous endovascular repair of ruptured abdominal aortic aneurysm performed 09/09/2019 EXAM: CTA ABDOMEN  AND PELVIS WITHOUT AND WITH CONTRAST TECHNIQUE: Multidetector CT imaging of the abdomen and pelvis was performed using the standard protocol during bolus administration of intravenous contrast. Multiplanar reconstructed images and MIPs were obtained and reviewed to evaluate the vascular anatomy. CONTRAST:  OMNIPAQUE IOHEXOL 350 MG/ML SOLN COMPARISON:  09/09/2019 FINDINGS: VASCULAR Aorta: No significant atherosclerotic changes of the lower thoracic aorta. Diameter of the thoracic aorta measures 2.8 cm at the hiatus. Interval endovascular repair of  ruptured infrarenal abdominal aortic aneurysm with infrarenal fixation. Main body appears other from the left, with the contralateral right limb terminating in the common iliac artery, and the ipsilateral left limb terminating in the external iliac artery. Embolization of the left hypogastric artery. The proximal stent graft margin terminates just below the levels of the renal arteries. Bilateral iliac limbs remain patent. The excluded aneurysm sac demonstrates a crescent of perfusion on the left lateral margin, compatible with type 2 endoleak. This contrast appears to originate from a patent inferior mesenteric artery. No definite CT evidence of a type 1 a or a type 1 B endoleak. The greatest estimated diameter of the excluded aneurysm sac measures 6.5 cm on image 31 of series 8. Overall there is significant reduction in the size of the excluded aneurysm sac which previously measured 7.2 cm. The current CT demonstrates evidence of aorta caval fistula, just cephalad to the confluence of the common iliac veins on image 78 of series 7. Overall there is decreasing volume of retroperitoneal edema/hematoma, with no evidence of new or expanding hematoma. The distribution of hematoma is again within the posterior aspect of the aorta, and in the left Peri renal/posterior renal fascial plane. Celiac: Patent, with no significant atherosclerotic changes. SMA: Patent, with no  significant atherosclerotic changes. Renals: Left renal artery patent with minimal atherosclerosis. The right main renal artery is delayed compared to the left on the arterial phase. The late phase imaging demonstrates perfusion of the right renal artery, as well as late perfusion of the right renal parenchyma. IMA: IMA is patent at the origin and is the most likely contributing artery into the type 2 endoleak. Right lower extremity: Right iliac limb remains patent, terminating within the common iliac artery. Hypogastric artery is patent. External iliac artery patent. Surgical changes at the right common femoral artery without complicating features. Proximal profunda femoris and SFA patent. Left lower extremity: Left iliac limb remains patent terminating in the external iliac artery. Coil embolization of the left hypogastric artery. External iliac artery patent. Common femoral artery patent with surgical changes. No complicating features. Proximal profunda femoris and SFA patent. Veins: Early venous opacification of the lower IVC above the confluence of the iliac veins, compatible with small arteriovenous fistula. Review of the MIP images confirms the above findings. NON-VASCULAR Lower chest: No acute. Hepatobiliary: Unremarkable appearance of the liver. Unremarkable gall bladder. Pancreas: Unremarkable. Spleen: Unremarkable. Adrenals/Urinary Tract: Unremarkable appearance of adrenal glands. Right: No hydronephrosis. No nephrolithiasis. Low-density cystic structure on the lateral cortex. There is delayed perfusion of the right renal parenchyma compared to the left, although there is uniform parenchymal enhancement on the delayed phase imaging. Left: No hydronephrosis. No nephrolithiasis. Benign-appearing cystic lesion in the superior left cortex. Uniform parenchymal enhancement. Unremarkable urinary bladder. Stomach/Bowel: Unremarkable appearance of the stomach. Unremarkable appearance of small bowel. No evidence of  obstruction. Normal appendix. Unremarkable proximal colon. No significant stool burden. Diverticular change within the sigmoid colon with no acute inflammatory changes. Lymphatic: No adenopathy. Mesenteric: Redemonstration periaortic hematoma, with no evidence of new or expanding retroperitoneal hematoma. Overall, the volume of hematoma has decreased from the preoperative CT. Reproductive: Unremarkable prostate. Other: Bilateral fat containing inguinal hernia. Fat containing umbilical hernia. Musculoskeletal: Negative for acute bony abnormality. Degenerative changes of the thoracolumbar spine. Surgical changes of right hip arthroplasty. IMPRESSION: Interval endovascular repair of ruptured infrarenal abdominal aortic aneurysm with infrarenal fixation and left hypogastric embolization. There has been interval decrease in the size of the excluded aneurysm sac, and there is  no evidence of new or expanding retroperitoneal hematoma, with the overall volume of retroperitoneal hematoma decreased from the preoperative CT. A type 2 endoleak is present, most likely originating from the IMA. This endoleak appears to contribute to small aorta caval fistula, just cephalad to the iliac vein confluence. Slow flow of the right renal artery, with asymmetric renal perfusion on the arterial phase of the CT, which becomes more uniform on the late phase CT imaging. These results were discussed at the time of interpretation on 09/15/2019 at 3:41 pm with Dr. Raeford Razor . Signed, Yvone Neu. Reyne Dumas, RPVI [facility] Radiology Electronically Signed   By: Gilmer Mor D.O.   On: 09/15/2019 15:49    Procedures Procedures (including critical care time)  Medications Ordered in ED Medications  iohexol (OMNIPAQUE) 350 MG/ML injection 100 mL (100 mLs Intravenous Contrast Given 09/15/19 1453)    ED Course  I have reviewed the triage vital signs and the nursing notes.  Pertinent labs &  imaging results that were available during my care of the patient were reviewed by me and considered in my medical decision making (see chart for details).    MDM Rules/Calculators/A&P                      C71-year-old male with lower abdominal pain and increased urinary frequency after recent AAA repair.  Bladder scan with only 135 cc.  He is not significantly retaining.  UA is not consistent with infection.  Given his recent surgery, will obtain CT angiography.  This does not show any acute abnormalities and I feel that he can be appropriately discharged with follow-up with his vascular surgeons as previously recommended.  He is already prescribed pain medicine to take as needed.  Final Clinical Impression(s) / ED Diagnoses Final diagnoses:  Lower abdominal pain    Rx / DC Orders ED Discharge Orders    None       Raeford Razor, MD 09/17/19 1417

## 2019-09-15 NOTE — ED Provider Notes (Signed)
Blood pressure 121/78, pulse 77, temperature 98.7 F (37.1 C), temperature source Oral, resp. rate 18, height 5\' 11"  (1.803 m), weight 118.8 kg, SpO2 95 %.  Assuming care from Dr. .  In short, Bruce Barrera is a 69 y.o. male with a chief complaint of Abdominal Pain .  Refer to the original H&P for additional details.  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 1/30.   04:31 PM  Spoke with Dr. Randie Heinz with vascular surgery who reviewed the CT and case with me by phone.  Not concerned clinically for the aortocaval fistula/endoleak.  He plans to call the patient today and have him come in to the office tomorrow morning for renal duplex to further assess the decreased flow to the right kidney.  He will call to set that appointment up and advises that the patient stay n.p.o. after midnight.  Pain well controlled here in the emergency department.  Plan for discharge home. Offered pain meds for home but patient declined. Ordered Zofran to take PRN.     Darrick Penna, MD 09/15/19 [phone]

## 2019-09-15 NOTE — Telephone Encounter (Signed)
done

## 2019-09-15 NOTE — ED Triage Notes (Signed)
Lower abd and lower back pain since Saturday.   Also reports frequent urination w/ some blood in urine. Pt had a AAA repair last week.

## 2019-09-16 ENCOUNTER — Other Ambulatory Visit: Payer: Self-pay

## 2019-09-16 ENCOUNTER — Encounter: Payer: Self-pay | Admitting: Vascular Surgery

## 2019-09-16 ENCOUNTER — Ambulatory Visit (HOSPITAL_COMMUNITY)
Admission: RE | Admit: 2019-09-16 | Discharge: 2019-09-16 | Disposition: A | Discharge status: Home / Self Care | Payer: Medicare Other | Source: Ambulatory Visit | Attending: Vascular Surgery | Admitting: Vascular Surgery

## 2019-09-16 ENCOUNTER — Ambulatory Visit (INDEPENDENT_AMBULATORY_CARE_PROVIDER_SITE_OTHER): Payer: Self-pay | Admitting: Vascular Surgery

## 2019-09-16 VITALS — BP 145/80 | HR 94 | Temp 97.3°F | Resp 18 | Ht 71.0 in | Wt 260.0 lb

## 2019-09-16 DIAGNOSIS — I701 Atherosclerosis of renal artery: Secondary | ICD-10-CM

## 2019-09-16 DIAGNOSIS — I713 Abdominal aortic aneurysm, ruptured, unspecified: Secondary | ICD-10-CM

## 2019-09-16 LAB — URINE CULTURE

## 2019-09-16 MED ORDER — HYDROCODONE-ACETAMINOPHEN 5-325 MG PO TABS: 1.0000 | ORAL_TABLET | Freq: Four times a day (QID) | ORAL | 0 refills | Status: DC | PRN

## 2019-09-16 NOTE — Progress Notes (Signed)
Patient name: Bruce Barrera MRN: [id number] DOB: 1951/06/23 Sex: male  REASON FOR VISIT: Triage evaluation for right flank pain and suspected infarction of right kidney following EVAR last week for ruptured abdominal aortic aneurysm  HPI: Bruce Barrera is a 69 y.o. male with multiple medical problems as noted below that presents for evaluation of right flank pain after presenting to [facility] yesterday in the setting of a recent repair of a ruptured abdominal aortic aneurysm with EVAR last week by Dr. Randie Heinz.  Ultimately patient was seen in the ED yesterday at [facility] underwent a CTA abdomen and pelvis.  There was well-positioned endograft with decreasing size of the retroperitoneal hematoma and a type II endoleak.  There was also limited flow in the right renal with limited perfusion on the arterial phase of the scan.   Patient reports the right flank pain was what prompted his presentation to [facility] yesterday.  He states it has been present since his initial aneurysm repair.  He states the last several days have been unbearable when at times can be 10 out of 10.  He is still making urine but states that sometimes it appears dark.  Overall feels urine output is increasing.    Past Medical History:  Diagnosis Date  . Arthritis    Hips and knees  . Back pain   . Chronic systolic CHF (congestive heart failure) (HCC)   . Coronary artery disease    a. 03/2016 subacute anterior MI -> 100% prox LAD >72 hours out thus treated medically, LVEF 30-35% initially  . Essential hypertension   . GERD (gastroesophageal reflux disease)   . Hemorrhoids   . History of stroke    States "stroke" with left sided weakness 5/95 in prison in [place]  . Hyperlipidemia   . Hypothyroidism   . Ischemic cardiomyopathy   . MI (myocardial infarction) (HCC)    States "heart attack" 2/95 in prison in [place]  . Obesity   . Peptic ulcer    GIB 1994  . Sleep apnea   . Vitamin D deficiency disease 06/19/2019    Past  Surgical History:  Procedure Laterality Date  . ABDOMINAL AORTIC ENDOVASCULAR STENT GRAFT N/A 09/09/2019   Procedure: ABDOMINAL AORTIC ENDOVASCULAR STENT GRAFT;  Surgeon: Maeola Harman, MD;  Location: [facility] OR;  Service: Vascular;  Laterality: N/A;  . ADJUSTABLE SUTURE MANIPULATION Left 04/10/2018   Procedure: ADJUSTABLE SUTURE MANIPULATION;  Surgeon: Aura Camps, MD;  Location: [facility] OR;  Service: Ophthalmology;  Laterality: Left;  . CARDIAC CATHETERIZATION N/A 03/15/2016   Procedure: Right/Left Heart Cath and Coronary Angiography;  Surgeon: Iran Ouch, MD;  Location: [facility] INVASIVE CV LAB;  Service: Cardiovascular;  Laterality: N/A;  . COLONOSCOPY  03/23/2011   hemorrhoids  . KNEE ARTHROSCOPY     Right knee x 2  . MEDIAN RECTUS REPAIR Left 04/10/2018   Procedure: LEFT MEDIAN RECTUS REPAIR;  Surgeon: Aura Camps, MD;  Location: [facility] OR;  Service: Ophthalmology;  Laterality: Left;  Marland Kitchen MUSCLE RECESSION AND RESECTION Left 04/10/2018   Procedure: LEFT MUSCLE RECESSION;  Surgeon: Aura Camps, MD;  Location: [facility] OR;  Service: Ophthalmology;  Laterality: Left;  . TOTAL HIP ARTHROPLASTY Right 2014    Family History  Problem Relation Age of Onset  . Hypertension Mother   . Diabetes Mother   . Heart disease Mother   . Stroke Mother   . Stroke Father   . Heart attack Brother   . Aneurysm Brother   . Cirrhosis Brother  SOCIAL HISTORY: Social History   Tobacco Use  . Smoking status: Current Every Day Smoker    Packs/day: 2.00    Types: Cigarettes    Start date: 05/18/1961  . Smokeless tobacco: Never Used  Substance Use Topics  . Alcohol use: No    No Known Allergies  Current Outpatient Medications  Medication Sig Dispense Refill  . aspirin 81 MG tablet Take 81 mg by mouth at bedtime.     Marland Kitchen atorvastatin (LIPITOR) 80 MG tablet Take 1 tablet (80 mg total) by mouth daily. 90 tablet 0  . carvedilol (COREG) 3.125 MG tablet Take 1 tablet (3.125 mg total) by mouth 2 (two)  times daily with a meal. 180 tablet 0  . clopidogrel (PLAVIX) 75 MG tablet Take 1 tablet (75 mg total) by mouth daily. 30 tablet 3  . ibuprofen (ADVIL) 200 MG tablet Take 800 mg by mouth every 8 (eight) hours.    Marland Kitchen levothyroxine (SYNTHROID) 100 MCG tablet Take 1 tablet (100 mcg total) by mouth daily before breakfast. 30 tablet 3  . ondansetron (ZOFRAN ODT) 4 MG disintegrating tablet Take 1 tablet (4 mg total) by mouth every 8 (eight) hours as needed. 20 tablet 0  . pantoprazole (PROTONIX) 40 MG tablet Take 1 tablet (40 mg total) by mouth at bedtime. 30 tablet 3  . sacubitril-valsartan (ENTRESTO) 24-26 MG Take 1 tablet by mouth 2 (two) times daily. 60 tablet 2  . triamterene-hydrochlorothiazide (MAXZIDE-25) 37.5-25 MG tablet Take 1 tablet by mouth daily at 12 noon.      No current facility-administered medications for this visit.    REVIEW OF SYSTEMS:  [X]  denotes positive finding, [ ]  denotes negative finding Cardiac  Comments:  Chest pain or chest pressure:    Shortness of breath upon exertion:    Short of breath when lying flat:    Irregular heart rhythm:        Vascular    Pain in calf, thigh, or hip brought on by ambulation:    Pain in feet at night that wakes you up from your sleep:     Blood clot in your veins:    Leg swelling:         Pulmonary    Oxygen at home:    Productive cough:     Wheezing:         Neurologic    Sudden weakness in arms or legs:     Sudden numbness in arms or legs:     Sudden onset of difficulty speaking or slurred speech:    Temporary loss of vision in one eye:     Problems with dizziness:         Gastrointestinal    Blood in stool:     Vomited blood:         Genitourinary    Burning when urinating:     Blood in urine:        Psychiatric    Major depression:         Hematologic    Bleeding problems:    Problems with blood clotting too easily:        Skin    Rashes or ulcers:        Constitutional    Fever or chills:       PHYSICAL EXAM: Vitals:   09/16/19 1334  BP: (!) 145/80  Pulse: 94  Resp: 18  Temp: (!) 97.3 F (36.3 C)  TempSrc: Temporal  SpO2: 100%  Weight: 260 lb (  117.9 kg)  Height: 5\' 11"  (1.803 m)    GENERAL: The patient is a well-nourished male, in no acute distress. The vital signs are documented above. CARDIAC: There is a regular rate and rhythm.  VASCULAR:  Palpable femoral pulses bilaterally No evidence of groin hematoma PULMONARY: There is good air exchange bilaterally without wheezing or rales. ABDOMEN: Obese, no rebound or guarding.  Right flank pain.    DATA:   I reviewed his CT abdomen pelvis from yesterday and agree that there is decreased perfusion of the right kidney based on the arterial phase.  Otherwise endograft appears to be appropriately positioned.  Left hypo coiled.  Good distal seal.   There is evidence of a type II endoleak.  Assessment/Plan:  69 year old male presents for evaluation of right flank pain after repair of ruptured abdominal aortic aneurysm with an endograft last week on 09/09/2019 by Dr. Donzetta Matters.  After review of CT yesterday at [facility] I agree there is certainly decreased perfusion of the right kidney.  Right renal artery duplex was ordered in clinic today but unfortunately with his morbid obesity it was very difficult to evaluate the right renal artery and a very limited study.  I suspect he has partial or complete infarction of the right kidney based on the CT findings and his history.  Certainly if this infarction has been ongoing since endovascular repair and cuff placement last week with partial coverage of the right renal artery the damage would be non-reversible at this point.  He did have renal studies yesterday and his creatinine only mildly increased from 0.92 to 1.05 and BUN and K stable.  My bigger concern is the fact that he has a very short neck of his aneurysm and a cuff had to be placed for a type I a endoleak.  In order to treat his right  renal would involve coming from the brachial artery in his arm and you would have to get a wire and possible sheath out the right renal and potentially place a balloon expandable stent.  This potentially could dislodge his cuff and he would have a recurrent type I endoleak and may end up requiring an open operation.  Ultimately I think risks outweigh benefits to any attempted intervention at this point.  Ultimately I think we should treat his pain given he continues to make urine and has relatively stable renal function.  I sent 25 tablets of Norco to his pharmacy.  He has follow-up with Dr. Donzetta Matters later this month.  I discussed he let us know if his pain progresses or if he stops making urine or any other issues arise.   Marty Heck, MD [facility] Office: [phone]

## 2019-09-18 ENCOUNTER — Other Ambulatory Visit: Payer: Self-pay

## 2019-09-18 DIAGNOSIS — I713 Abdominal aortic aneurysm, ruptured, unspecified: Secondary | ICD-10-CM

## 2019-10-06 ENCOUNTER — Other Ambulatory Visit: Payer: Self-pay

## 2019-10-06 ENCOUNTER — Emergency Department (HOSPITAL_COMMUNITY)
Admission: EM | Admit: 2019-10-06 | Discharge: 2019-10-06 | Disposition: A | Discharge status: Home / Self Care | Payer: Medicare Other | Source: Home / Self Care | Attending: Emergency Medicine | Admitting: Emergency Medicine

## 2019-10-06 ENCOUNTER — Ambulatory Visit
Admission: RE | Admit: 2019-10-06 | Discharge: 2019-10-06 | Disposition: A | Discharge status: Home / Self Care | Payer: Medicare Other | Source: Ambulatory Visit | Attending: Vascular Surgery | Admitting: Vascular Surgery

## 2019-10-06 ENCOUNTER — Encounter (HOSPITAL_COMMUNITY): Payer: Self-pay

## 2019-10-06 DIAGNOSIS — Z9114 Patient's other noncompliance with medication regimen: Secondary | ICD-10-CM | POA: Diagnosis not present

## 2019-10-06 DIAGNOSIS — Z8711 Personal history of peptic ulcer disease: Secondary | ICD-10-CM | POA: Diagnosis not present

## 2019-10-06 DIAGNOSIS — I714 Abdominal aortic aneurysm, without rupture: Secondary | ICD-10-CM | POA: Diagnosis not present

## 2019-10-06 DIAGNOSIS — J449 Chronic obstructive pulmonary disease, unspecified: Secondary | ICD-10-CM | POA: Diagnosis not present

## 2019-10-06 DIAGNOSIS — J810 Acute pulmonary edema: Secondary | ICD-10-CM | POA: Diagnosis not present

## 2019-10-06 DIAGNOSIS — E782 Mixed hyperlipidemia: Secondary | ICD-10-CM | POA: Diagnosis not present

## 2019-10-06 DIAGNOSIS — I5033 Acute on chronic diastolic (congestive) heart failure: Secondary | ICD-10-CM | POA: Diagnosis not present

## 2019-10-06 DIAGNOSIS — I2510 Atherosclerotic heart disease of native coronary artery without angina pectoris: Secondary | ICD-10-CM | POA: Insufficient documentation

## 2019-10-06 DIAGNOSIS — Z20822 Contact with and (suspected) exposure to covid-19: Secondary | ICD-10-CM | POA: Diagnosis not present

## 2019-10-06 DIAGNOSIS — I255 Ischemic cardiomyopathy: Secondary | ICD-10-CM | POA: Diagnosis not present

## 2019-10-06 DIAGNOSIS — Z96641 Presence of right artificial hip joint: Secondary | ICD-10-CM | POA: Insufficient documentation

## 2019-10-06 DIAGNOSIS — E039 Hypothyroidism, unspecified: Secondary | ICD-10-CM | POA: Insufficient documentation

## 2019-10-06 DIAGNOSIS — I110 Hypertensive heart disease with heart failure: Secondary | ICD-10-CM | POA: Diagnosis not present

## 2019-10-06 DIAGNOSIS — K219 Gastro-esophageal reflux disease without esophagitis: Secondary | ICD-10-CM | POA: Diagnosis not present

## 2019-10-06 DIAGNOSIS — Z79899 Other long term (current) drug therapy: Secondary | ICD-10-CM | POA: Diagnosis not present

## 2019-10-06 DIAGNOSIS — K5900 Constipation, unspecified: Secondary | ICD-10-CM | POA: Diagnosis not present

## 2019-10-06 DIAGNOSIS — I713 Abdominal aortic aneurysm, ruptured, unspecified: Secondary | ICD-10-CM

## 2019-10-06 DIAGNOSIS — F17210 Nicotine dependence, cigarettes, uncomplicated: Secondary | ICD-10-CM | POA: Insufficient documentation

## 2019-10-06 DIAGNOSIS — I5022 Chronic systolic (congestive) heart failure: Secondary | ICD-10-CM | POA: Insufficient documentation

## 2019-10-06 DIAGNOSIS — I69354 Hemiplegia and hemiparesis following cerebral infarction affecting left non-dominant side: Secondary | ICD-10-CM | POA: Diagnosis not present

## 2019-10-06 DIAGNOSIS — Z7902 Long term (current) use of antithrombotics/antiplatelets: Secondary | ICD-10-CM | POA: Diagnosis not present

## 2019-10-06 DIAGNOSIS — R509 Fever, unspecified: Secondary | ICD-10-CM | POA: Insufficient documentation

## 2019-10-06 DIAGNOSIS — Z79891 Long term (current) use of opiate analgesic: Secondary | ICD-10-CM | POA: Diagnosis not present

## 2019-10-06 DIAGNOSIS — I252 Old myocardial infarction: Secondary | ICD-10-CM | POA: Diagnosis not present

## 2019-10-06 DIAGNOSIS — Z7982 Long term (current) use of aspirin: Secondary | ICD-10-CM | POA: Insufficient documentation

## 2019-10-06 DIAGNOSIS — J9601 Acute respiratory failure with hypoxia: Secondary | ICD-10-CM | POA: Diagnosis not present

## 2019-10-06 DIAGNOSIS — R0602 Shortness of breath: Secondary | ICD-10-CM | POA: Diagnosis not present

## 2019-10-06 DIAGNOSIS — G4733 Obstructive sleep apnea (adult) (pediatric): Secondary | ICD-10-CM | POA: Diagnosis not present

## 2019-10-06 MED ORDER — IOPAMIDOL (ISOVUE-300) INJECTION 61%
100.0000 mL | Freq: Once | INTRAVENOUS | Status: AC | PRN
  Administered 2019-10-06: 100 mL via INTRAVENOUS

## 2019-10-06 NOTE — ED Triage Notes (Signed)
Pt here requesting covid test, pt says his girlfriend is having sugery this week and he "wants to make sure he doesn't have covid". Pt denies symptoms. Says he has COPD, so he is always short of breath, but no other symptoms reported.

## 2019-10-06 NOTE — ED Notes (Signed)
SO having surgery Was at hospital today for pre surg workup   Here because he desires a covid test   Reports he is always short of breath due to his COPD

## 2019-10-06 NOTE — ED Provider Notes (Signed)
[facility] EMERGENCY DEPARTMENT Provider Note   CSN: [id number] Arrival date & time: 10/06/19  1834     History Chief Complaint  Patient presents with  . requesting covid test    Bruce Barrera is a 69 y.o. male past medical history of CHF, CAD, GERD, ruptured AAA who presents for evaluation of concern for possible Covid test.  Patient states that yesterday, he noted to have a fever of 100.5.  He states he checked it about 20 minutes later and it was down to 99.9.  He states that he has not had any other symptoms.  But today, he noticed a fever of 102.4.  He took ibuprofen with improvement of the fever.  He states that he was concerned because his girlfriend is getting ready to have surgery and so he needed to be tested.  He states that he always has some mild shortness of breath and cough secondary to COPD and smoking of which he is trying to quit but denies that these are worse than his normal baseline.  He has not noted any chest pain, abdominal pain, nausea/vomiting, diarrhea, rash, areas of warmth or redness.   The history is provided by the patient.       Past Medical History:  Diagnosis Date  . Arthritis    Hips and knees  . Back pain   . Chronic systolic CHF (congestive heart failure) (HCC)   . Coronary artery disease    a. 03/2016 subacute anterior MI -> 100% prox LAD >72 hours out thus treated medically, LVEF 30-35% initially  . Essential hypertension   . GERD (gastroesophageal reflux disease)   . Hemorrhoids   . History of stroke    States "stroke" with left sided weakness 5/95 in prison in [place]  . Hyperlipidemia   . Hypothyroidism   . Ischemic cardiomyopathy   . MI (myocardial infarction) (HCC)    States "heart attack" 2/95 in prison in [place]  . Obesity   . Peptic ulcer    GIB 1994  . Sleep apnea   . Vitamin D deficiency disease 06/19/2019    Patient Active Problem List   Diagnosis Date Noted  . Ruptured abdominal aortic aneurysm (HCC) 09/09/2019  . Ruptured  abdominal aortic aneurysm (AAA) (HCC) 09/09/2019  . Hypothyroidism, adult 06/19/2019  . Vitamin D deficiency disease 06/19/2019  . Diplopia 04/07/2018    Class: Chronic  . Strabismus 04/07/2018    Class: Chronic  . Non-acute transmural anterior myocardial infarction within last eight weeks 03/15/2016  . Pain in the chest   . Chest pain 03/13/2016  . COPD exacerbation (HCC) 03/13/2016  . Exertional angina (HCC) 12/06/2010  . Coronary atherosclerosis of native coronary artery 12/06/2010  . Essential hypertension, benign 12/06/2010  . Mixed hyperlipidemia 12/06/2010  . Tobacco use disorder 12/06/2010  . Morbid obesity (HCC) 12/06/2010    Past Surgical History:  Procedure Laterality Date  . ABDOMINAL AORTIC ENDOVASCULAR STENT GRAFT N/A 09/09/2019   Procedure: ABDOMINAL AORTIC ENDOVASCULAR STENT GRAFT;  Surgeon: Maeola Harman, MD;  Location: [facility];  Service: Vascular;  Laterality: N/A;  . ADJUSTABLE SUTURE MANIPULATION Left 04/10/2018   Procedure: ADJUSTABLE SUTURE MANIPULATION;  Surgeon: Aura Camps, MD;  Location: [facility] OR;  Service: Ophthalmology;  Laterality: Left;  . CARDIAC CATHETERIZATION N/A 03/15/2016   Procedure: Right/Left Heart Cath and Coronary Angiography;  Surgeon: Iran Ouch, MD;  Location: [facility] INVASIVE CV LAB;  Service: Cardiovascular;  Laterality: N/A;  . COLONOSCOPY  03/23/2011   hemorrhoids  .  KNEE ARTHROSCOPY     Right knee x 2  . MEDIAN RECTUS REPAIR Left 04/10/2018   Procedure: LEFT MEDIAN RECTUS REPAIR;  Surgeon: Aura CampsSpencer, Michael, MD;  [facility];  Service: Ophthalmology;  Laterality: Left;  Marland Kitchen. MUSCLE RECESSION AND RESECTION Left 04/10/2018   Procedure: LEFT MUSCLE RECESSION;  Surgeon: Aura CampsSpencer, Michael, MD;  [facility];  Service: Ophthalmology;  Laterality: Left;  . TOTAL HIP ARTHROPLASTY Right 2014       Family History  Problem Relation Age of Onset  . Hypertension Mother   . Diabetes Mother   . Heart disease Mother   . Stroke  Mother   . Stroke Father   . Heart attack Brother   . Aneurysm Brother   . Cirrhosis Brother     Social History   Tobacco Use  . Smoking status: Current Every Day Smoker    Packs/day: 2.00    Types: Cigarettes    Start date: 05/18/1961  . Smokeless tobacco: Never Used  Substance Use Topics  . Alcohol use: No  . Drug use: No    Home Medications Prior to Admission medications   Medication Sig Start Date End Date Taking? Authorizing Provider  aspirin 81 MG tablet Take 81 mg by mouth at bedtime.     [provider]  atorvastatin (LIPITOR) 80 MG tablet Take 1 tablet (80 mg total) by mouth daily. 06/25/19   Wilson SingerGosrani, Nimish C, MD  carvedilol (COREG) 3.125 MG tablet Take 1 tablet (3.125 mg total) by mouth 2 (two) times daily with a meal. 06/25/19   Gosrani, Nimish C, MD  clopidogrel (PLAVIX) 75 MG tablet Take 1 tablet (75 mg total) by mouth daily. 06/25/19   Wilson SingerGosrani, Nimish C, MD  HYDROcodone-acetaminophen (NORCO) 5-325 MG tablet Take 1 tablet by mouth every 6 (six) hours as needed for moderate pain. 09/16/19   Cephus Shellinglark, Christopher J, MD  ibuprofen (ADVIL) 200 MG tablet Take 800 mg by mouth every 8 (eight) hours.    [provider]  levothyroxine (SYNTHROID) 100 MCG tablet Take 1 tablet (100 mcg total) by mouth daily before breakfast. 06/25/19   Karilyn CotaGosrani, Nimish C, MD  ondansetron (ZOFRAN ODT) 4 MG disintegrating tablet Take 1 tablet (4 mg total) by mouth every 8 (eight) hours as needed. 09/15/19   Long, Arlyss RepressJoshua G, MD  pantoprazole (PROTONIX) 40 MG tablet Take 1 tablet (40 mg total) by mouth at bedtime. 06/25/19   Wilson SingerGosrani, Nimish C, MD  sacubitril-valsartan (ENTRESTO) 24-26 MG Take 1 tablet by mouth 2 (two) times daily. 06/25/19   Wilson SingerGosrani, Nimish C, MD  triamterene-hydrochlorothiazide (MAXZIDE-25) 37.5-25 MG tablet Take 1 tablet by mouth daily at 12 noon.  12/07/17   [provider]    Allergies    Patient has no known allergies.  Review of Systems   Review of Systems   Constitutional: Positive for fever.  Respiratory: Positive for cough (Chronic) and shortness of breath (Chronic).   Cardiovascular: Negative for chest pain.  Gastrointestinal: Negative for abdominal pain, nausea and vomiting.  Skin: Negative for color change.  Neurological: Negative for headaches.  All other systems reviewed and are negative.   Physical Exam Updated Vital Signs BP 136/79 (BP Location: Right Arm)   Pulse (!) 104   Temp 99.2 F (37.3 C) (Oral)   Resp 20   Ht 5\' 11"  (1.803 m)   Wt 116.6 kg   SpO2 96% Comment: pt supposed to be on O2 at home, but does not use  BMI 35.84 kg/m  Physical Exam Vitals and nursing note reviewed.  Constitutional:      Appearance: Normal appearance. He is well-developed.  HENT:     Head: Normocephalic and atraumatic.  Eyes:     General: Lids are normal.     Conjunctiva/sclera: Conjunctivae normal.     Pupils: Pupils are equal, round, and reactive to light.  Cardiovascular:     Rate and Rhythm: Normal rate and regular rhythm.     Pulses: Normal pulses.     Heart sounds: Normal heart sounds. No murmur. No friction rub. No gallop.   Pulmonary:     Effort: Pulmonary effort is normal.     Breath sounds: Normal breath sounds.     Comments: Lungs clear to auscultation bilaterally.  Symmetric chest rise.  No wheezing, rales, rhonchi.  No evidence of respiratory distress.  Speaking in long sentences without any difficulty. Abdominal:     Palpations: Abdomen is soft. Abdomen is not rigid.     Tenderness: There is no abdominal tenderness. There is no guarding.     Comments: Abdomen is soft, non-distended, non-tender. No rigidity, No guarding. No peritoneal signs.  Musculoskeletal:        General: Normal range of motion.     Cervical back: Full passive range of motion without pain.  Skin:    General: Skin is warm and dry.     Capillary Refill: Capillary refill takes less than 2 seconds.  Neurological:     Mental Status: He is alert and  oriented to person, place, and time.  Psychiatric:        Speech: Speech normal.     ED Results / Procedures / Treatments   Labs (all labs ordered are listed, but only abnormal results are displayed) Labs Reviewed  SARS CORONAVIRUS 2 (TAT 6-24 HRS)    EKG None  Radiology CT ANGIO ABDOMEN/PELVIS(stent or AAA) W &/OR WO CONTRAST  Result Date: 10/06/2019 CLINICAL DATA:  History of ruptured aortic aneurysm post endovascular repair with known endoleak and aortocaval fistula EXAM: CTA ABDOMEN AND PELVIS WITHOUT AND WITH CONTRAST TECHNIQUE: Multidetector CT imaging of the abdomen and pelvis was performed using the standard protocol during bolus administration of intravenous contrast. Multiplanar reconstructed images and MIPs were obtained and reviewed to evaluate the vascular anatomy. CONTRAST:  112mL ISOVUE-300 IOPAMIDOL (ISOVUE-300) INJECTION 61% COMPARISON:  CT abdomen pelvis-09/15/2019; 09/09/2019 FINDINGS: VASCULAR Aorta: Post endovascular repair of ruptured abdominal aortic aneurysm. The cranial end of the stent graft appears well apposed against the walls of the peri/infrarenal abdominal aorta as well as the bilateral common iliac arteries. Stable sequela of coil embolization of the left internal iliac artery and overlapping stent placement of the left iliac gait into the left external iliac artery. There is persistent serpiginous opacification of the excluded abdominal aortic aneurysm sac (image 108, series 9), supplied via the IMA (image 113, series 9) with persistent suspected fistulous connection between the distal aspect of the abdominal aorta and the IVC (image 117, series 9; image 24, series 10). Native abdominal aortic aneurysm sac measures approximately 6.0 x 6.7 x 5.5 cm as measured in greatest oblique short axis axial (image 19, series 10), coronal (image 101, series 11) and sagittal (image 130, series 12), dimensions respectively, unchanged to slightly increased in size compared to the  09/2018 examination, previously, 5.7 x 6.8 x 5.3 cm, though measurements are difficult secondary to residual periaortic stranding. No definitive extra aortic/caval extravasation. Celiac: There is a minimal amount of mixed calcified and noncalcified atherosclerotic plaque  involving the origin the celiac artery, not resulting in a hemodynamically significant stenosis. Conventional branching pattern. SMA: There is a minimal amount of atherosclerotic plaque involving the origin the SMA, not resulting in hemodynamically significant stenosis. Conventional branching pattern. The distal tributaries the SMA are widely patent without discrete internal filling defect to suggest distal embolism. Renals: There is subtotal occlusion involving the right renal artery with associated delayed enhancement and continued interval atrophy of the right kidney in comparison to the left. The left renal artery remains widely patent without a hemodynamically significant narrowing. No vessel irregularity to suggest FMD. IMA: As above, the IMA remains patent and contributes to the type 2 endoleak. Inflow: As above, the distal limbs of the endovascular stent graft are well apposed against the walls of the bilateral common iliac arteries. There is extension of the left iliac gait into the left external iliac artery. Persistent occlusion of the left internal iliac artery following percutaneous coil embolization. The right external and internal iliac arteries are of normal caliber and widely patent without a hemodynamically significant narrowing. Proximal Outflow: Ill-defined stranding about the bilateral groins, right greater left, the sequela previous percutaneous access. No contrast extravasation or hematoma. The imaged portions of the bilateral deep and superficial femoral arteries are widely patent. Veins: As above, there is persistent early opacification of the caudal aspect of the IVC via a residual aortocaval fistula. The IVC and pelvic  venous systems appear widely patent. Review of the MIP images confirms the above findings. _________________________________________________________ NON-VASCULAR Lower chest: Limited visualization of the lower thorax demonstrates minimal subsegmental atelectasis within the left costophrenic angle. Hepatobiliary: Normal hepatic contour. No discrete hyperenhancing hepatic lesions. There is a punctate (approximately 5 mm) stone within neck of an otherwise normal-appearing gallbladder. No gallbladder wall thickening or pericholecystic fluid. No intra extrahepatic biliary duct dilatation. No ascites. Pancreas: Normal appearance of the pancreas. Spleen: Normal appearance of the spleen. Adrenals/Urinary Tract: Redemonstrated asymmetrically delayed enhancement involving the majority of the right kidney with relative preservation involving the superior pole of the right kidney which receives collateral supply from the ipsilateral right adrenal artery (representative coronal image 38, series 11). This finding is associated with mild asymmetric atrophy of the right kidney in comparison to the left. Renal cysts are again seen bilaterally. No evidence of nephrolithiasis. Similar-appearing mild grossly symmetric bilateral perinephric stranding. No urinary obstruction. There is mild thickening of the bilateral adrenal glands without discrete nodule. Circumferential wall thickening involving the urinary bladder, potentially accentuated due to underdistention. Stomach/Bowel: Scattered colonic diverticulosis without evidence of superimposed acute diverticulitis. Moderate colonic stool burden without evidence of enteric obstruction. Normal appearance of the terminal ileum and retrocecal appendix. No discrete areas of bowel wall thickening. No hiatal hernia. No pneumoperitoneum, pneumatosis or portal venous gas. Lymphatic: No bulky retroperitoneal, mesenteric, pelvic or inguinal lymphadenopathy. Reproductive: Dystrophic calcifications  within a enlarged prostate gland. There is a small amount of fluid seen with the pelvic cul-de-sac. Other: Peripherally enhancing macrolobulated fluid collection along the left retroperitoneum measures approximately 8.4 x 2.8 x 9.3 cm (axial image 27, series 10; coronal image 77, series 18). Musculoskeletal: No acute or aggressive osseous abnormalities. Stigmata of dish within the lower thoracic and upper lumbar spine. Post right total hip replacement, incompletely evaluated though no definite evidence of hardware failure or loosening. IMPRESSION: VASCULAR 1. 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 This  finding is associated with potential slight increase in size of the native abdominal aortic aneurysm sac, currently measuring, 6.0 x 6.7 x 5.5 cm, previously, 5.7 x 6.8 x 5.3 cm, though note, measurements are difficult secondary to residual periaortic stranding. No definitive extra aortic or caval contrast extravasation. 2. 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 The left renal artery remains widely patent without a hemodynamically significant narrowing 3.  Aortic aneurysm NOS (ICD10-I71.9). 4.  Aortic Atherosclerosis (ICD10-I70.0). NON-VASCULAR 1. Peripherally enhancing macrolobulated left-sided retroperitoneal fluid collection measures approximately 9.3 cm in maximal diameter, favored to represent an evolving hematoma though infection not excluded on the basis of this examination. 2. Cholelithiasis without evidence of cholecystitis. Electronically Signed   By: Simonne ComeJohn  Watts M.D.   On: 10/06/2019 12:15    Procedures Procedures (including critical care time)  Medications Ordered in ED Medications - No data to display  ED Course  I have reviewed the triage  vital signs and the nursing notes.  Pertinent labs & imaging results that were available during my care of the patient were reviewed by me and considered in my medical decision making (see chart for details).    MDM Rules/Calculators/A&P                      69 year old male who presents for evaluation of wanting a Covid test.  He states that yesterday, he had noted a fever but states that it had gone down.  Today, he had a fever again but states it is improved.  He has baseline shortness of breath and coughing but states it is not any worse than normal.  No other symptoms.  He states he has not any recent travel or known COVID-19 exposure.  He is concerned because his girlfriend is getting ready to have surgery.  On initially arrival, he is afebrile, nontoxic-appearing.  Vital signs are stable.  No evidence of hypoxia.  He is speaking full sentences without difficulty.  No evidence of respiratory distress noted on lung exam.  Belly exam is benign.  At this time, no other source for his fever.  Will plan for outpatient Covid test.  At this time, given overall well appearance, reassuring vitals, no indication for admission. At this time, patient exhibits no emergent life-threatening condition that require further evaluation in ED or admission. Patient had ample opportunity for questions and discussion. All patient's questions were answered with full understanding. Strict return precautions discussed. Patient expresses understanding and agreement to plan.   Bruce SabinJohn Desaulniers was evaluated in Emergency Department on 10/06/2019 for the symptoms described in the history of present illness. He was evaluated in the context of the global COVID-19 pandemic, which necessitated consideration that the patient might be at risk for infection with the SARS-CoV-2 virus that causes COVID-19. Institutional protocols and algorithms that pertain to the evaluation of patients at risk for COVID-19 are in a state of rapid change  based on information released by regulatory bodies including the CDC and federal and state organizations. These policies and algorithms were followed during the patient's care in the ED.  Portions of this note were generated with Scientist, clinical (histocompatibility and immunogenetics)Dragon dictation software. Dictation errors may occur despite best attempts at proofreading.   Final Clinical Impression(s) / ED Diagnoses Final diagnoses:  Suspected COVID-19 virus infection    Rx / DC Orders ED Discharge Orders    None       Maxwell CaulLayden, Lizza Huffaker A, PA-C 10/06/19  2011    Vanetta Mulders, MD 10/07/19 [phone]

## 2019-10-06 NOTE — Discharge Instructions (Signed)
You have been tested today for COVID-19.  Your results will take about 6 to 18 hours to come back.  You can check online regarding results on MyChart.  If you are positive, you will need to quarantine for 2 weeks.  He should quarantine until results come back.  If you are positive, you may have fevers, body aches, fatigue, decreased appetite.  You need to return the emergency department if you have any trouble breathing, chest pain, vomiting persistently or any other worsening or concerning symptoms.

## 2019-10-07 LAB — SARS CORONAVIRUS 2 (TAT 6-24 HRS): SARS Coronavirus 2: NEGATIVE

## 2019-10-08 ENCOUNTER — Emergency Department (HOSPITAL_COMMUNITY): Payer: Medicare Other

## 2019-10-08 ENCOUNTER — Inpatient Hospital Stay (HOSPITAL_COMMUNITY)
Admission: EM | Admit: 2019-10-08 | Discharge: 2019-10-10 | DRG: 291 | Disposition: A | Discharge status: Home / Self Care | Payer: Medicare Other | Attending: Student in an Organized Health Care Education/Training Program | Admitting: Student in an Organized Health Care Education/Training Program

## 2019-10-08 ENCOUNTER — Encounter (HOSPITAL_COMMUNITY): Payer: Self-pay | Admitting: Student in an Organized Health Care Education/Training Program

## 2019-10-08 ENCOUNTER — Other Ambulatory Visit: Payer: Self-pay

## 2019-10-08 DIAGNOSIS — Z79899 Other long term (current) drug therapy: Secondary | ICD-10-CM | POA: Diagnosis not present

## 2019-10-08 DIAGNOSIS — Z20822 Contact with and (suspected) exposure to covid-19: Secondary | ICD-10-CM | POA: Diagnosis present

## 2019-10-08 DIAGNOSIS — I25119 Atherosclerotic heart disease of native coronary artery with unspecified angina pectoris: Secondary | ICD-10-CM | POA: Diagnosis not present

## 2019-10-08 DIAGNOSIS — J449 Chronic obstructive pulmonary disease, unspecified: Secondary | ICD-10-CM | POA: Diagnosis present

## 2019-10-08 DIAGNOSIS — E782 Mixed hyperlipidemia: Secondary | ICD-10-CM | POA: Diagnosis present

## 2019-10-08 DIAGNOSIS — J9601 Acute respiratory failure with hypoxia: Secondary | ICD-10-CM | POA: Diagnosis present

## 2019-10-08 DIAGNOSIS — G4733 Obstructive sleep apnea (adult) (pediatric): Secondary | ICD-10-CM | POA: Diagnosis present

## 2019-10-08 DIAGNOSIS — Z7902 Long term (current) use of antithrombotics/antiplatelets: Secondary | ICD-10-CM | POA: Diagnosis not present

## 2019-10-08 DIAGNOSIS — E039 Hypothyroidism, unspecified: Secondary | ICD-10-CM | POA: Diagnosis present

## 2019-10-08 DIAGNOSIS — Z833 Family history of diabetes mellitus: Secondary | ICD-10-CM

## 2019-10-08 DIAGNOSIS — Z96641 Presence of right artificial hip joint: Secondary | ICD-10-CM

## 2019-10-08 DIAGNOSIS — Z79891 Long term (current) use of opiate analgesic: Secondary | ICD-10-CM

## 2019-10-08 DIAGNOSIS — I2510 Atherosclerotic heart disease of native coronary artery without angina pectoris: Secondary | ICD-10-CM | POA: Diagnosis present

## 2019-10-08 DIAGNOSIS — I5033 Acute on chronic diastolic (congestive) heart failure: Secondary | ICD-10-CM | POA: Diagnosis present

## 2019-10-08 DIAGNOSIS — Z7982 Long term (current) use of aspirin: Secondary | ICD-10-CM | POA: Diagnosis not present

## 2019-10-08 DIAGNOSIS — N400 Enlarged prostate without lower urinary tract symptoms: Secondary | ICD-10-CM | POA: Diagnosis present

## 2019-10-08 DIAGNOSIS — K219 Gastro-esophageal reflux disease without esophagitis: Secondary | ICD-10-CM | POA: Diagnosis present

## 2019-10-08 DIAGNOSIS — Z6835 Body mass index (BMI) 35.0-35.9, adult: Secondary | ICD-10-CM | POA: Diagnosis not present

## 2019-10-08 DIAGNOSIS — E669 Obesity, unspecified: Secondary | ICD-10-CM | POA: Diagnosis present

## 2019-10-08 DIAGNOSIS — I69354 Hemiplegia and hemiparesis following cerebral infarction affecting left non-dominant side: Secondary | ICD-10-CM

## 2019-10-08 DIAGNOSIS — I10 Essential (primary) hypertension: Secondary | ICD-10-CM | POA: Diagnosis present

## 2019-10-08 DIAGNOSIS — R0602 Shortness of breath: Secondary | ICD-10-CM | POA: Diagnosis not present

## 2019-10-08 DIAGNOSIS — I714 Abdominal aortic aneurysm, without rupture: Secondary | ICD-10-CM | POA: Diagnosis not present

## 2019-10-08 DIAGNOSIS — K5900 Constipation, unspecified: Secondary | ICD-10-CM | POA: Diagnosis present

## 2019-10-08 DIAGNOSIS — Z8249 Family history of ischemic heart disease and other diseases of the circulatory system: Secondary | ICD-10-CM

## 2019-10-08 DIAGNOSIS — J810 Acute pulmonary edema: Secondary | ICD-10-CM | POA: Diagnosis not present

## 2019-10-08 DIAGNOSIS — Z9114 Patient's other noncompliance with medication regimen: Secondary | ICD-10-CM

## 2019-10-08 DIAGNOSIS — I5020 Unspecified systolic (congestive) heart failure: Secondary | ICD-10-CM | POA: Diagnosis not present

## 2019-10-08 DIAGNOSIS — Z79890 Hormone replacement therapy (postmenopausal): Secondary | ICD-10-CM | POA: Diagnosis not present

## 2019-10-08 DIAGNOSIS — E559 Vitamin D deficiency, unspecified: Secondary | ICD-10-CM | POA: Diagnosis present

## 2019-10-08 DIAGNOSIS — I252 Old myocardial infarction: Secondary | ICD-10-CM

## 2019-10-08 DIAGNOSIS — I5023 Acute on chronic systolic (congestive) heart failure: Secondary | ICD-10-CM | POA: Diagnosis not present

## 2019-10-08 DIAGNOSIS — I110 Hypertensive heart disease with heart failure: Secondary | ICD-10-CM | POA: Diagnosis present

## 2019-10-08 DIAGNOSIS — I255 Ischemic cardiomyopathy: Secondary | ICD-10-CM | POA: Diagnosis not present

## 2019-10-08 DIAGNOSIS — Z8711 Personal history of peptic ulcer disease: Secondary | ICD-10-CM

## 2019-10-08 DIAGNOSIS — Z823 Family history of stroke: Secondary | ICD-10-CM

## 2019-10-08 DIAGNOSIS — Z7984 Long term (current) use of oral hypoglycemic drugs: Secondary | ICD-10-CM

## 2019-10-08 DIAGNOSIS — F17210 Nicotine dependence, cigarettes, uncomplicated: Secondary | ICD-10-CM | POA: Diagnosis present

## 2019-10-08 DIAGNOSIS — Z791 Long term (current) use of non-steroidal anti-inflammatories (NSAID): Secondary | ICD-10-CM

## 2019-10-08 LAB — BASIC METABOLIC PANEL
Anion gap: 9 (ref 5–15)
BUN: 15 mg/dL (ref 8–23)
CO2: 28 mmol/L (ref 22–32)
Calcium: 8.4 mg/dL — ABNORMAL LOW (ref 8.9–10.3)
Chloride: 101 mmol/L (ref 98–111)
Creatinine, Ser: 1.14 mg/dL (ref 0.61–1.24)
GFR calc Af Amer: >60 mL/min (ref >60)
GFR calc non Af Amer: >60 mL/min (ref >60)
Glucose, Bld: 203 mg/dL — ABNORMAL HIGH (ref 70–99)
Potassium: 3.8 mmol/L (ref 3.5–5.1)
Sodium: 138 mmol/L (ref 135–145)

## 2019-10-08 LAB — RESPIRATORY PANEL BY RT PCR (FLU A&B, COVID)
Influenza A by PCR: NEGATIVE
Influenza B by PCR: NEGATIVE
SARS Coronavirus 2 by RT PCR: NEGATIVE

## 2019-10-08 LAB — D-DIMER, QUANTITATIVE: D-Dimer, Quant: 7.67 ug/mL-FEU — ABNORMAL HIGH (ref 0.00–0.50)

## 2019-10-08 LAB — TROPONIN I (HIGH SENSITIVITY)
Troponin I (High Sensitivity): 26 ng/L — ABNORMAL HIGH (ref <18)
Troponin I (High Sensitivity): 52 ng/L — ABNORMAL HIGH (ref <18)
Troponin I (High Sensitivity): 53 ng/L — ABNORMAL HIGH (ref <18)
Troponin I (High Sensitivity): 53 ng/L — ABNORMAL HIGH (ref <18)
Troponin I (High Sensitivity): 48 ng/L — ABNORMAL HIGH (ref <18)

## 2019-10-08 LAB — FIBRINOGEN: Fibrinogen: 624 mg/dL — ABNORMAL HIGH (ref 210–475)

## 2019-10-08 LAB — CBC
HCT: 40.7 % (ref 39.0–52.0)
Hemoglobin: 13.1 g/dL (ref 13.0–17.0)
MCH: 32 pg (ref 26.0–34.0)
MCHC: 32.2 g/dL (ref 30.0–36.0)
MCV: 99.5 fL (ref 80.0–100.0)
Platelets: 162 10*3/uL (ref 150–400)
RBC: 4.09 MIL/uL — ABNORMAL LOW (ref 4.22–5.81)
RDW: 15.1 % (ref 11.5–15.5)
WBC: 5.5 10*3/uL (ref 4.0–10.5)
nRBC: 0 % (ref 0.0–0.2)

## 2019-10-08 LAB — FERRITIN: Ferritin: 1573 ng/mL — ABNORMAL HIGH (ref 24–336)

## 2019-10-08 LAB — LACTATE DEHYDROGENASE: LDH: 321 U/L — ABNORMAL HIGH (ref 98–192)

## 2019-10-08 LAB — C-REACTIVE PROTEIN: CRP: 16.4 mg/dL — ABNORMAL HIGH (ref <1.0)

## 2019-10-08 LAB — BRAIN NATRIURETIC PEPTIDE: B Natriuretic Peptide: 478.8 pg/mL — ABNORMAL HIGH (ref 0.0–100.0)

## 2019-10-08 LAB — LACTIC ACID, PLASMA: Lactic Acid, Venous: 1.6 mmol/L (ref 0.5–1.9)

## 2019-10-08 LAB — MRSA PCR SCREENING: MRSA by PCR: NEGATIVE

## 2019-10-08 LAB — POC SARS CORONAVIRUS 2 AG -  ED: SARS Coronavirus 2 Ag: NEGATIVE

## 2019-10-08 LAB — PROCALCITONIN: Procalcitonin: 0.51 ng/mL

## 2019-10-08 LAB — TRIGLYCERIDES: Triglycerides: 293 mg/dL — ABNORMAL HIGH (ref <150)

## 2019-10-08 LAB — HIV ANTIBODY (ROUTINE TESTING W REFLEX): HIV Screen 4th Generation wRfx: NONREACTIVE

## 2019-10-08 MED ORDER — ALBUTEROL SULFATE HFA 108 (90 BASE) MCG/ACT IN AERS
6.0000 | INHALATION_SPRAY | Freq: Once | RESPIRATORY_TRACT | Status: AC
  Administered 2019-10-08: 6 via RESPIRATORY_TRACT
  Filled 2019-10-08: qty 6.7

## 2019-10-08 MED ORDER — ONDANSETRON HCL 4 MG/2ML IJ SOLN: 4.0000 mg | Freq: Four times a day (QID) | INTRAMUSCULAR | Status: DC | PRN

## 2019-10-08 MED ORDER — ONDANSETRON 4 MG PO TBDP: 4.0000 mg | ORAL_TABLET | Freq: Three times a day (TID) | ORAL | Status: DC | PRN

## 2019-10-08 MED ORDER — DEXAMETHASONE SODIUM PHOSPHATE 10 MG/ML IJ SOLN
10.0000 mg | Freq: Once | INTRAMUSCULAR | Status: AC
  Administered 2019-10-08: 15:00:00 10 mg via INTRAVENOUS
  Filled 2019-10-08: qty 1

## 2019-10-08 MED ORDER — SODIUM CHLORIDE 0.9 % IV SOLN: 250.0000 mL | INTRAVENOUS | Status: DC | PRN

## 2019-10-08 MED ORDER — SACUBITRIL-VALSARTAN 24-26 MG PO TABS
1.0000 | ORAL_TABLET | Freq: Two times a day (BID) | ORAL | Status: DC
  Administered 2019-10-08 – 2019-10-10 (×4): 1 via ORAL
  Filled 2019-10-08 (×6): qty 1

## 2019-10-08 MED ORDER — PANTOPRAZOLE SODIUM 40 MG PO TBEC
40.0000 mg | DELAYED_RELEASE_TABLET | Freq: Every day | ORAL | Status: DC
  Administered 2019-10-08 – 2019-10-09 (×2): 40 mg via ORAL
  Filled 2019-10-08 (×2): qty 1

## 2019-10-08 MED ORDER — ENOXAPARIN SODIUM 40 MG/0.4ML ~~LOC~~ SOLN
40.0000 mg | SUBCUTANEOUS | Status: DC
  Administered 2019-10-08 – 2019-10-09 (×2): 40 mg via SUBCUTANEOUS
  Filled 2019-10-08 (×2): qty 0.4

## 2019-10-08 MED ORDER — HYDROCODONE-ACETAMINOPHEN 5-325 MG PO TABS: 1.0000 | ORAL_TABLET | Freq: Four times a day (QID) | ORAL | Status: DC | PRN

## 2019-10-08 MED ORDER — CLOPIDOGREL BISULFATE 75 MG PO TABS
75.0000 mg | ORAL_TABLET | Freq: Every day | ORAL | Status: DC
  Administered 2019-10-08 – 2019-10-10 (×3): 75 mg via ORAL
  Filled 2019-10-08 (×3): qty 1

## 2019-10-08 MED ORDER — FUROSEMIDE 10 MG/ML IJ SOLN
40.0000 mg | Freq: Once | INTRAMUSCULAR | Status: AC
  Administered 2019-10-08: 15:00:00 40 mg via INTRAVENOUS
  Filled 2019-10-08: qty 4

## 2019-10-08 MED ORDER — SODIUM CHLORIDE 0.9% FLUSH: 3.0000 mL | Freq: Once | INTRAVENOUS | Status: DC

## 2019-10-08 MED ORDER — NITROGLYCERIN IN D5W 200-5 MCG/ML-% IV SOLN
0.0000 ug/min | INTRAVENOUS | Status: DC
  Administered 2019-10-08: 15:00:00 5 ug/min via INTRAVENOUS
  Filled 2019-10-08: qty 250

## 2019-10-08 MED ORDER — ISOSORBIDE MONONITRATE ER 30 MG PO TB24: 15.0000 mg | ORAL_TABLET | Freq: Every day | ORAL | Status: DC

## 2019-10-08 MED ORDER — ASPIRIN EC 81 MG PO TBEC
81.0000 mg | DELAYED_RELEASE_TABLET | Freq: Every day | ORAL | Status: DC
  Administered 2019-10-08 – 2019-10-09 (×2): 81 mg via ORAL
  Filled 2019-10-08 (×2): qty 1

## 2019-10-08 MED ORDER — LEVOTHYROXINE SODIUM 100 MCG PO TABS
100.0000 ug | ORAL_TABLET | Freq: Every day | ORAL | Status: DC
  Administered 2019-10-10: 100 ug via ORAL
  Filled 2019-10-08 (×2): qty 1

## 2019-10-08 MED ORDER — FUROSEMIDE 10 MG/ML IJ SOLN
40.0000 mg | Freq: Two times a day (BID) | INTRAMUSCULAR | Status: DC
  Administered 2019-10-08 – 2019-10-10 (×4): 40 mg via INTRAVENOUS
  Filled 2019-10-08 (×4): qty 4

## 2019-10-08 MED ORDER — SODIUM CHLORIDE 0.9% FLUSH
3.0000 mL | Freq: Two times a day (BID) | INTRAVENOUS | Status: DC
  Administered 2019-10-08 – 2019-10-10 (×4): 3 mL via INTRAVENOUS

## 2019-10-08 MED ORDER — ATORVASTATIN CALCIUM 80 MG PO TABS
80.0000 mg | ORAL_TABLET | Freq: Every day | ORAL | Status: DC
  Administered 2019-10-08 – 2019-10-10 (×3): 80 mg via ORAL
  Filled 2019-10-08 (×4): qty 1

## 2019-10-08 MED ORDER — ACETAMINOPHEN 325 MG PO TABS: 650.0000 mg | ORAL_TABLET | ORAL | Status: DC | PRN

## 2019-10-08 MED ORDER — SODIUM CHLORIDE 0.9% FLUSH: 3.0000 mL | INTRAVENOUS | Status: DC | PRN

## 2019-10-08 NOTE — ED Provider Notes (Signed)
  Provider Note MRN:  [id number]  Arrival date & time: 10/08/19    ED Course and Medical Decision Making  Assumed care from Dr. Manus Gunning at shift change.  Hypoxic respiratory failure, arrived satting in the 70s, tachypneic, hypertensive, question of Covid versus pneumonia versus CHF exacerbation.  Requiring high flow nasal cannula, has received Lasix, nitro drip, steroids, will need admission.  5:15 PM update: Admitted to internal medicine for further care.  .Critical Care Performed by: Sabas Sous, MD Authorized by: Sabas Sous, MD   Critical care provider statement:    Critical care time (minutes):  32   Critical care was necessary to treat or prevent imminent or life-threatening deterioration of the following conditions:  Respiratory failure   Critical care was time spent personally by me on the following activities:  Discussions with consultants, evaluation of patient's response to treatment, examination of patient, ordering and performing treatments and interventions, ordering and review of laboratory studies, ordering and review of radiographic studies, pulse oximetry, re-evaluation of patient's condition, obtaining history from patient or surrogate and review of old charts   I assumed direction of critical care for this patient from another provider in my specialty: yes      Final Clinical Impressions(s) / ED Diagnoses     ICD-10-CM   1. Acute respiratory failure with hypoxia [facility])  J96.01     ED Discharge Orders    None      Discharge Instructions   None     Elmer Sow. Pilar Plate, MD [facility] Emergency Medicine [facility] [email or web address]    Sabas Sous, MD 10/08/19 [phone]

## 2019-10-08 NOTE — H&P (Addendum)
Date: 10/08/2019               Patient Name:  Bruce Barrera MRN: [id number]  DOB: 1951/08/16 Age / Sex: 69 y.o., male   PCP: Wilson Singer, MD         Medical Service: Internal Medicine Teaching Service         Attending Physician: Dr. Oswaldo Done, Marquita Palms, *    First Contact: Dr. Huel Cote Pager: [phone]  Second Contact: Dr. Chesley Mires Pager: [phone]       After Hours (After 5p/  First Contact Pager: [phone]  weekends / holidays): Second Contact Pager: [phone]   Chief Complaint: Shortness of breathe  History of Present Illness:   [name] is a 69 year old male with a past medical history of recent ruptured AAA s/p stenting, CAD with multiple MI, ischemic cardiomyopathy, COPD who presents to the ED with complaints of shortness of breath.  [name] states that he has been having shortness of breath intermittently all of his life but this has significantly worsened since his recent AAA stenting at the end of December 2020.  Shortness of breath tends to come on suddenly, both at rest and with exertion.  It is usually preceded by diaphoresis and he does occasionally have chest pain during SOB episodes.  Chest pain does not radiate to any other location.  Alleviating factors for shortness of breath include burping, blowing his nose, taking sips of cold water, putting his face in front of a cold fan, and occasionally using his sister's CPAP.  He denies any fevers, chills, sore throat, runny nose, cough, nausea, vomiting, abdominal pain, diarrhea.  He denies any recent sick contacts.  2 days ago, he went to [facility] ED for similar symptoms and states that he had a CT at that time but is unaware of the results.  Mr. Merlo received a dose of diuretic in the ED and states this has improved his breathing significantly.  Mr. Dudash also endorses chronic lower extremity swelling that has been persistent since he stopped all his medications approximately 1.5 weeks  ago.  ED course: On arrival, patient was noted to be hypoxic, tachycardic and tachypneic.  Hypoxia improved with nonrebreather and was eventually able to be weaned to 7 L HFNC.  CBC unremarkable.  BMP positive for hyperglycemia at 203 but no evidence of AKI.  BNP elevated at 478.  D-dimer elevated at 7.67.  Covid, influenza both negative.  Pro-Cal is 0.51.  Inflammatory markers including LDH, ferritin, fibrinogen all mildly elevated, with the exception of CRP which is significantly elevated at 16.4.  Troponin initially 26 and increased to 52.  Meds: (Ran out of all medications 1.5 weeks ago) Current Meds  Medication Sig  . aspirin 81 MG tablet Take 81 mg by mouth at bedtime.   Marland Kitchen atorvastatin (LIPITOR) 80 MG tablet Take 1 tablet (80 mg total) by mouth daily.  . carvedilol (COREG) 3.125 MG tablet Take 1 tablet (3.125 mg total) by mouth 2 (two) times daily with a meal.  . clopidogrel (PLAVIX) 75 MG tablet Take 1 tablet (75 mg total) by mouth daily.  Marland Kitchen HYDROcodone-acetaminophen (NORCO) 5-325 MG tablet Take 1 tablet by mouth every 6 (six) hours as needed for moderate pain.  Marland Kitchen levothyroxine (SYNTHROID) 100 MCG tablet Take 1 tablet (100 mcg total) by mouth daily before breakfast.  . ondansetron (ZOFRAN ODT) 4 MG disintegrating tablet Take 1 tablet (4 mg total) by mouth every 8 (eight) hours as needed.  Marland Kitchen  pantoprazole (PROTONIX) 40 MG tablet Take 1 tablet (40 mg total) by mouth at bedtime.  . sacubitril-valsartan (ENTRESTO) 24-26 MG Take 1 tablet by mouth 2 (two) times daily.  Marland Kitchen triamterene-hydrochlorothiazide (MAXZIDE-25) 37.5-25 MG tablet Take 1 tablet by mouth daily at 12 noon.    Allergies: Allergies as of 10/08/2019  . (No Known Allergies)   Past Medical History:  Diagnosis Date  . Arthritis    Hips and knees  . Back pain   . Chronic systolic CHF (congestive heart failure) ([place])   . Coronary artery disease    a. 03/2016 subacute anterior MI -> 100% prox LAD >72 hours out thus treated  medically, LVEF 30-35% initially  . Essential hypertension   . GERD (gastroesophageal reflux disease)   . Hemorrhoids   . History of stroke    States "stroke" with left sided weakness 5/95 in prison in [place]  . Hyperlipidemia   . Hypothyroidism   . Ischemic cardiomyopathy   . MI (myocardial infarction) ([place])    States "heart attack" 2/95 in prison in [place]  . Obesity   . Peptic ulcer    GIB 1994  . Sleep apnea   . Vitamin D deficiency disease 06/19/2019   Family History:  Brother: MI @ age 74  Sister: Brain aneurysm Brother: Ruptured AAA  Social History:  Tobacco: 1-2 ppd x 58 years  Drugs: Denies Alcohol: Denies   Commutes daily to [employer] and has a GF, Susie, that lives nearby.   Review of Systems: A complete ROS was negative except as per HPI.  Physical Exam: Blood pressure (!) 167/98, pulse 95, resp. rate (!) 24, SpO2 100 %.  Physical Exam Vitals and nursing note reviewed.  Constitutional:      General: He is not in acute distress.    Appearance: He is obese.  Cardiovascular:     Rate and Rhythm: Normal rate and regular rhythm.     Heart sounds: No murmur.  Pulmonary:     Effort: Tachypnea present. No accessory muscle usage.     Breath sounds: Examination of the right-middle field reveals rales. Examination of the left-middle field reveals rales. Examination of the right-lower field reveals rales. Examination of the left-lower field reveals rales. Decreased breath sounds (diffusely) and rales present. No wheezing or rhonchi.  Musculoskeletal:     Right lower leg: Edema (1+ pitting) present.     Left lower leg: Edema (1+ pitting) present.  Skin:    General: Skin is warm and dry.     Comments: Hyperpigmentation in bilateral lower extremities.   Neurological:     General: No focal deficit present.     Mental Status: He is alert and oriented to person, place, and time.     Motor: No weakness.  Psychiatric:        Mood and Affect: Mood is anxious (GF is in  surgery).        Behavior: Behavior normal.    EKG: personally reviewed my interpretation is: Sinus tachycardia with rate of 134. Regular rhythm. Significant motion artifact.   CXR: personally reviewed my interpretation is: Increased pulmonary edema with mild bilateral pleural effusions.   Assessment & Plan by Problem: Active Problems:   Acute respiratory failure with hypoxia [facility])  Mr. Kameren Pargas is a 69 year old male with a past medical history of recent ruptured AAA s/p stenting, CAD with multiple MI, ischemic cardiomyopathy, COPD who is currently admitted for acute hypoxic respiratory failure.   # Acute Hypoxic Respiratory Failure  Patient  has an inconsistent, intermittent history of sudden shortness of breath.  Unfortunately, he has multiple comorbidities that make the differential quite wide.  However, given his lower extremity edema, symptomatic improvement with diuresis, history of ICM with last echo being 1 year ago and history of medication noncompliance, differential leaning towards acute decompensation of heart failure.  Untreated COPD may also be contributing, however no wheezing on examination.  He also notes symptoms consistent with unstable angina, however chest pain is not always present with shortness of breath.  Given patient's elevated D-dimer and history of recent hospitalization, PE is on the differential but lower given other more consistent possible diagnoses. If worsening hypoxia, will obtain CTA stat.  - Telemetry - Supplementation oxygen PRN - Wean oxygen as tolerated  - TTE - Lasix 40 mg BID   # Ischemic Cardiomyopathy # HFrEF Last echo was 1.5 years ago and did show normalization of LVEF to 55-60%. Unfortunately, due to what sounds like financial concerns, he has been non-compliant with his medication regimen in the past 1 week and per chart review, several times this year. Acute hypoxia is concerning for acute decompensation.   - TTE - Lasix 40 mg BID  -  Entresto 24-36 mg BID  - Condom catheter  - Strict in/out - Daily weights   # CAD # Angina  Patient gives a history of stable angina with some CP when having SOB at rest, which is concerning for unstable angina. Troponins are mildly elevated at 26 then 52, will continue to trend. EKG without ischemic evidence. In the ED, patient was receiving Nitro drip but denying CP at this time, discontinued   - Telemetry  - Stat EKG and troponin with acute CP   # COPD Patient is not currently receiving treatment due to financial concerns regarding inhalers. No wheezing on examination today.   - Albuterol PRN for wheezing   # OSA Per chart review, mild sleep apnea noted on sleep study in 2018. He has not been consistently wearing a CPAP and only uses his sister's when having acute SOB.   - CPAP overnight    Code: Full Diet: Heart healthy DVT ppx: Lovenox   Dispo: Admit patient to Inpatient with expected length of stay greater than 2 midnights.  Signed: Dr. Verdene Lennert Internal Medicine PGY-1  Pager: [phone] 10/08/2019, 7:59 PM

## 2019-10-08 NOTE — ED Provider Notes (Signed)
[facility] EMERGENCY DEPARTMENT Provider Note   CSN: [id number] Arrival date & time: 10/08/19  1415     History Chief Complaint  Patient presents with  . Shortness of Breath    Bruce Barrera is a 69 y.o. male.  Level 5 caveat for respiratory distress.  Patient with a history of CHF, CAD, ruptured AAA, ischemic cardiomyopathy presenting with acute onset of shortness of breath. Patient here with sudden onset difficulty breathing.  States he has had difficulty breathing coming and going for the past several days.  On arrival he is tachypneic, tachycardic to the 130s, hypotensive, hypoxic to the 80s.  He was seen in the ED several days ago for a Covid test but that was negative.  He states he has not been coughing but is coughing on arrival.  He has not had a fever that he knows of.  He has some chest pain and shortness of breath that is fairly constant. He denies any abdominal pain, nausea or vomiting.  The history is provided by the patient.  Shortness of Breath      Past Medical History:  Diagnosis Date  . Arthritis    Hips and knees  . Back pain   . Chronic systolic CHF (congestive heart failure) ([facility])   . Coronary artery disease    a. 03/2016 subacute anterior MI -> 100% prox LAD >72 hours out thus treated medically, LVEF 30-35% initially  . Essential hypertension   . GERD (gastroesophageal reflux disease)   . Hemorrhoids   . History of stroke    States "stroke" with left sided weakness 5/95 in prison in [place]  . Hyperlipidemia   . Hypothyroidism   . Ischemic cardiomyopathy   . MI (myocardial infarction) ([facility])    States "heart attack" 2/95 in prison in [place]  . Obesity   . Peptic ulcer    GIB 1994  . Sleep apnea   . Vitamin D deficiency disease 06/19/2019    Patient Active Problem List   Diagnosis Date Noted  . Ruptured abdominal aortic aneurysm ([facility]) 09/09/2019  . Ruptured abdominal aortic aneurysm (AAA) ([facility]) 09/09/2019  . Hypothyroidism, adult  06/19/2019  . Vitamin D deficiency disease 06/19/2019  . Diplopia 04/07/2018    Class: Chronic  . Strabismus 04/07/2018    Class: Chronic  . Non-acute transmural anterior myocardial infarction within last eight weeks 03/15/2016  . Pain in the chest   . Chest pain 03/13/2016  . COPD exacerbation ([facility]) 03/13/2016  . Exertional angina ([facility]) 12/06/2010  . Coronary atherosclerosis of native coronary artery 12/06/2010  . Essential hypertension, benign 12/06/2010  . Mixed hyperlipidemia 12/06/2010  . Tobacco use disorder 12/06/2010  . Morbid obesity ([facility]) 12/06/2010    Past Surgical History:  Procedure Laterality Date  . ABDOMINAL AORTIC ENDOVASCULAR STENT GRAFT N/A 09/09/2019   Procedure: ABDOMINAL AORTIC ENDOVASCULAR STENT GRAFT;  Surgeon: Maeola Harman, MD;  Location: [facility] OR;  Service: Vascular;  Laterality: N/A;  . ADJUSTABLE SUTURE MANIPULATION Left 04/10/2018   Procedure: ADJUSTABLE SUTURE MANIPULATION;  Surgeon: Aura Camps, MD;  Location: [facility] OR;  Service: Ophthalmology;  Laterality: Left;  . CARDIAC CATHETERIZATION N/A 03/15/2016   Procedure: Right/Left Heart Cath and Coronary Angiography;  Surgeon: Iran Ouch, MD;  Location: [facility] INVASIVE CV LAB;  Service: Cardiovascular;  Laterality: N/A;  . COLONOSCOPY  03/23/2011   hemorrhoids  . KNEE ARTHROSCOPY     Right knee x 2  . MEDIAN RECTUS REPAIR Left 04/10/2018   Procedure: LEFT MEDIAN  RECTUS REPAIR;  Surgeon: Aura CampsSpencer, Michael, MD;  [facility];  Service: Ophthalmology;  Laterality: Left;  Marland Kitchen. MUSCLE RECESSION AND RESECTION Left 04/10/2018   Procedure: LEFT MUSCLE RECESSION;  Surgeon: Aura CampsSpencer, Michael, MD;  [facility];  Service: Ophthalmology;  Laterality: Left;  . TOTAL HIP ARTHROPLASTY Right 2014       Family History  Problem Relation Age of Onset  . Hypertension Mother   . Diabetes Mother   . Heart disease Mother   . Stroke Mother   . Stroke Father   . Heart attack Brother   . Aneurysm Brother   .  Cirrhosis Brother     Social History   Tobacco Use  . Smoking status: Current Every Day Smoker    Packs/day: 2.00    Types: Cigarettes    Start date: 05/18/1961  . Smokeless tobacco: Never Used  Substance Use Topics  . Alcohol use: No  . Drug use: No    Home Medications Prior to Admission medications   Medication Sig Start Date End Date Taking? Authorizing Provider  aspirin 81 MG tablet Take 81 mg by mouth at bedtime.     [provider]  atorvastatin (LIPITOR) 80 MG tablet Take 1 tablet (80 mg total) by mouth daily. 06/25/19   Wilson SingerGosrani, Nimish C, MD  carvedilol (COREG) 3.125 MG tablet Take 1 tablet (3.125 mg total) by mouth 2 (two) times daily with a meal. 06/25/19   Gosrani, Nimish C, MD  clopidogrel (PLAVIX) 75 MG tablet Take 1 tablet (75 mg total) by mouth daily. 06/25/19   Wilson SingerGosrani, Nimish C, MD  HYDROcodone-acetaminophen (NORCO) 5-325 MG tablet Take 1 tablet by mouth every 6 (six) hours as needed for moderate pain. 09/16/19   Cephus Shellinglark, Christopher J, MD  ibuprofen (ADVIL) 200 MG tablet Take 800 mg by mouth every 8 (eight) hours.    [provider]  levothyroxine (SYNTHROID) 100 MCG tablet Take 1 tablet (100 mcg total) by mouth daily before breakfast. 06/25/19   Karilyn CotaGosrani, Nimish C, MD  ondansetron (ZOFRAN ODT) 4 MG disintegrating tablet Take 1 tablet (4 mg total) by mouth every 8 (eight) hours as needed. 09/15/19   Long, Arlyss RepressJoshua G, MD  pantoprazole (PROTONIX) 40 MG tablet Take 1 tablet (40 mg total) by mouth at bedtime. 06/25/19   Wilson SingerGosrani, Nimish C, MD  sacubitril-valsartan (ENTRESTO) 24-26 MG Take 1 tablet by mouth 2 (two) times daily. 06/25/19   Wilson SingerGosrani, Nimish C, MD  triamterene-hydrochlorothiazide (MAXZIDE-25) 37.5-25 MG tablet Take 1 tablet by mouth daily at 12 noon.  12/07/17   [provider]    Allergies    Patient has no known allergies.  Review of Systems   Review of Systems  Unable to perform ROS: Severe respiratory distress  Respiratory: Positive  for shortness of breath.     Physical Exam Updated Vital Signs BP (!) 171/120 (BP Location: Right Arm)   Pulse (!) 132   Resp (!) 25   Physical Exam Vitals and nursing note reviewed.  Constitutional:      General: He is in acute distress.     Appearance: He is well-developed. He is obese. He is ill-appearing.     Comments: Respiratory distress, speaking in 1-2 word phrases.  Tachypneic to the 40s, tachycardic to the 140s  HENT:     Head: Normocephalic and atraumatic.     Mouth/Throat:     Pharynx: No oropharyngeal exudate.  Eyes:     Conjunctiva/sclera: Conjunctivae normal.     Pupils: Pupils are  equal, round, and reactive to light.  Neck:     Comments: No meningismus. Cardiovascular:     Rate and Rhythm: Regular rhythm. Tachycardia present.     Heart sounds: Normal heart sounds. No murmur.  Pulmonary:     Effort: Respiratory distress present.     Breath sounds: Rales present.     Comments: Diminished breath sounds, basilar rhonchi Abdominal:     Palpations: Abdomen is soft.     Tenderness: There is no abdominal tenderness. There is no guarding or rebound.  Musculoskeletal:        General: No tenderness. Normal range of motion.     Cervical back: Normal range of motion and neck supple.  Skin:    General: Skin is warm.  Neurological:     Mental Status: He is alert and oriented to person, place, and time.     Cranial Nerves: No cranial nerve deficit.     Motor: No abnormal muscle tone.     Coordination: Coordination normal.     Comments: No ataxia on finger to nose bilaterally. No pronator drift. 5/5 strength throughout. CN 2-12 intact.Equal grip strength. Sensation intact.   Psychiatric:        Behavior: Behavior normal.     ED Results / Procedures / Treatments   Labs (all labs ordered are listed, but only abnormal results are displayed) Labs Reviewed  BASIC METABOLIC PANEL - Abnormal; Notable for the following components:      Result Value   Glucose, Bld 203  (*)    Calcium 8.4 (*)    All other components within normal limits  CBC - Abnormal; Notable for the following components:   RBC 4.09 (*)    All other components within normal limits  BRAIN NATRIURETIC PEPTIDE - Abnormal; Notable for the following components:   B Natriuretic Peptide 478.8 (*)    All other components within normal limits  D-DIMER, QUANTITATIVE (NOT AT [facility]) - Abnormal; Notable for the following components:   D-Dimer, Quant 7.67 (*)    All other components within normal limits  LACTATE DEHYDROGENASE - Abnormal; Notable for the following components:   LDH 321 (*)    All other components within normal limits  FERRITIN - Abnormal; Notable for the following components:   Ferritin 1,573 (*)    All other components within normal limits  FIBRINOGEN - Abnormal; Notable for the following components:   Fibrinogen 624 (*)    All other components within normal limits  C-REACTIVE PROTEIN - Abnormal; Notable for the following components:   CRP 16.4 (*)    All other components within normal limits  TRIGLYCERIDES - Abnormal; Notable for the following components:   Triglycerides 293 (*)    All other components within normal limits  TROPONIN I (HIGH SENSITIVITY) - Abnormal; Notable for the following components:   Troponin I (High Sensitivity) 26 (*)    All other components within normal limits  TROPONIN I (HIGH SENSITIVITY) - Abnormal; Notable for the following components:   Troponin I (High Sensitivity) 52 (*)    All other components within normal limits  CULTURE, BLOOD (ROUTINE X 2)  RESPIRATORY PANEL BY RT PCR (FLU A&B, COVID)  CULTURE, BLOOD (ROUTINE X 2)  LACTIC ACID, PLASMA  PROCALCITONIN  HIV ANTIBODY (ROUTINE TESTING W REFLEX)  BASIC METABOLIC PANEL  POC SARS CORONAVIRUS 2 AG -  ED  TROPONIN I (HIGH SENSITIVITY)    EKG None  Radiology DG Chest Portable 1 View  Result Date: 10/08/2019 CLINICAL DATA:  Shortness of breath EXAM: PORTABLE CHEST 1 VIEW COMPARISON:   03/12/2016, CT evaluation of 09/09/2019 FINDINGS: Heart size is stable. Hilar structures are unremarkable. Lungs with mild increased interstitial markings slightly increased from prior exam particularly in the basilar portions of the chest. No acute bone finding. IMPRESSION: Mildly increased interstitial markings particularly at the lung bases may represent mild edema or pneumonitis. Electronically Signed   By: Zetta Bills M.D.   On: 10/08/2019 14:57    Procedures .Critical Care Performed by: Ezequiel Essex, MD Authorized by: Ezequiel Essex, MD   Critical care provider statement:    Critical care time (minutes):  45   Critical care was necessary to treat or prevent imminent or life-threatening deterioration of the following conditions:  Respiratory failure and cardiac failure   Critical care was time spent personally by me on the following activities:  Discussions with consultants, evaluation of patient's response to treatment, examination of patient, ordering and performing treatments and interventions, ordering and review of laboratory studies, ordering and review of radiographic studies, pulse oximetry, re-evaluation of patient's condition, obtaining history from patient or surrogate and review of old charts   (including critical care time)  Medications Ordered in ED Medications  sodium chloride flush (NS) 0.9 % injection 3 mL (has no administration in time range)  dexamethasone (DECADRON) injection 10 mg (has no administration in time range)  albuterol (VENTOLIN HFA) 108 (90 Base) MCG/ACT inhaler 6 puff (has no administration in time range)  furosemide (LASIX) injection 40 mg (has no administration in time range)  nitroGLYCERIN 50 mg in dextrose 5 % 250 mL (0.2 mg/mL) infusion (has no administration in time range)    ED Course  I have reviewed the triage vital signs and the nursing notes.  Pertinent labs & imaging results that were available during my care of the patient were  reviewed by me and considered in my medical decision making (see chart for details).    MDM Rules/Calculators/A&P                     Respiratory distress with tachypnea, tachycardia, hypertension and hypoxia.  Diminished breath sounds with crackles at the bases.  Recent negative Covid test.  Patient given Decadron, albuterol, IV Lasix.  Will also start IV nitroglycerin.  Edema on XRay. Last echo with normal EF.   COVID negative but  inflammatory markers elevated.   Improving on O2, NTG, lasix steroids.  HR and BP improving.   Suspect pulmonary edema, possible underlying COVID. PE seems less likely despite elevated D-dimer.   ABG without significant CO2 retention.   Admission d/w internal medicine residents.  Consuelo Thayne was evaluated in Emergency Department on 10/08/2019 for the symptoms described in the history of present illness. He was evaluated in the context of the global COVID-19 pandemic, which necessitated consideration that the patient might be at risk for infection with the SARS-CoV-2 virus that causes COVID-19. Institutional protocols and algorithms that pertain to the evaluation of patients at risk for COVID-19 are in a state of rapid change based on information released by regulatory bodies including the CDC and federal and state organizations. These policies and algorithms were followed during the patient's care in the ED.   ED ECG REPORT   Date: 10/08/2019  Rate: 134  Rhythm: sinus tachycardia  QRS Axis: normal  Intervals: normal  ST/T Wave abnormalities: nonspecific ST/T changes  Conduction Disutrbances:none  Narrative Interpretation:   Old EKG Reviewed: changes noted  I have personally reviewed the  EKG tracing and agree with the computerized printout as noted.  Final Clinical Impression(s) / ED Diagnoses Final diagnoses:  Acute respiratory failure with hypoxia (HCC)  Acute pulmonary edema [facility])    Rx / DC Orders ED Discharge Orders    None        Zaul Hubers, Jeannett Senior, MD 10/08/19 1851

## 2019-10-08 NOTE — ED Notes (Signed)
Placed pt on 6L 02 per Nectar 

## 2019-10-08 NOTE — ED Notes (Signed)
[phone] DAUGHTER

## 2019-10-08 NOTE — ED Triage Notes (Signed)
pt arrive to triage in respiratory distress- Speaking in broken sentences with labored respirations. Denies cp. Pt stats this began suddenly. Denies any fever or chills. Pt does have cough. Room air sat 87% placed on 2L [place] and taken to treatment room.

## 2019-10-09 ENCOUNTER — Inpatient Hospital Stay (HOSPITAL_COMMUNITY): Payer: Medicare Other

## 2019-10-09 DIAGNOSIS — Z7982 Long term (current) use of aspirin: Secondary | ICD-10-CM

## 2019-10-09 DIAGNOSIS — I25119 Atherosclerotic heart disease of native coronary artery with unspecified angina pectoris: Secondary | ICD-10-CM

## 2019-10-09 DIAGNOSIS — N400 Enlarged prostate without lower urinary tract symptoms: Secondary | ICD-10-CM

## 2019-10-09 DIAGNOSIS — Z7902 Long term (current) use of antithrombotics/antiplatelets: Secondary | ICD-10-CM

## 2019-10-09 DIAGNOSIS — I5020 Unspecified systolic (congestive) heart failure: Secondary | ICD-10-CM

## 2019-10-09 DIAGNOSIS — Z79899 Other long term (current) drug therapy: Secondary | ICD-10-CM

## 2019-10-09 DIAGNOSIS — G4733 Obstructive sleep apnea (adult) (pediatric): Secondary | ICD-10-CM

## 2019-10-09 DIAGNOSIS — R0602 Shortness of breath: Secondary | ICD-10-CM

## 2019-10-09 DIAGNOSIS — J9601 Acute respiratory failure with hypoxia: Secondary | ICD-10-CM

## 2019-10-09 DIAGNOSIS — I714 Abdominal aortic aneurysm, without rupture: Secondary | ICD-10-CM

## 2019-10-09 DIAGNOSIS — I252 Old myocardial infarction: Secondary | ICD-10-CM

## 2019-10-09 DIAGNOSIS — I5033 Acute on chronic diastolic (congestive) heart failure: Secondary | ICD-10-CM | POA: Diagnosis present

## 2019-10-09 DIAGNOSIS — K5900 Constipation, unspecified: Secondary | ICD-10-CM

## 2019-10-09 DIAGNOSIS — J449 Chronic obstructive pulmonary disease, unspecified: Secondary | ICD-10-CM

## 2019-10-09 DIAGNOSIS — I255 Ischemic cardiomyopathy: Secondary | ICD-10-CM

## 2019-10-09 LAB — TROPONIN I (HIGH SENSITIVITY): Troponin I (High Sensitivity): 44 ng/L — ABNORMAL HIGH (ref <18)

## 2019-10-09 LAB — BASIC METABOLIC PANEL
Anion gap: 10 (ref 5–15)
BUN: 16 mg/dL (ref 8–23)
CO2: 29 mmol/L (ref 22–32)
Calcium: 8.5 mg/dL — ABNORMAL LOW (ref 8.9–10.3)
Chloride: 94 mmol/L — ABNORMAL LOW (ref 98–111)
Creatinine, Ser: 1.08 mg/dL (ref 0.61–1.24)
GFR calc Af Amer: >60 mL/min (ref >60)
GFR calc non Af Amer: >60 mL/min (ref >60)
Glucose, Bld: 232 mg/dL — ABNORMAL HIGH (ref 70–99)
Potassium: 3.1 mmol/L — ABNORMAL LOW (ref 3.5–5.1)
Sodium: 133 mmol/L — ABNORMAL LOW (ref 135–145)

## 2019-10-09 LAB — ECHOCARDIOGRAM COMPLETE
Height: 71 in
Weight: 3989.44 oz

## 2019-10-09 MED ORDER — PERFLUTREN LIPID MICROSPHERE
1.0000 mL | INTRAVENOUS | Status: AC | PRN
  Administered 2019-10-09: 5 mL via INTRAVENOUS
  Filled 2019-10-09: qty 10

## 2019-10-09 MED ORDER — CARVEDILOL 3.125 MG PO TABS
3.1250 mg | ORAL_TABLET | Freq: Two times a day (BID) | ORAL | Status: DC
  Administered 2019-10-09 – 2019-10-10 (×2): 3.125 mg via ORAL
  Filled 2019-10-09 (×2): qty 1

## 2019-10-09 MED ORDER — MAGNESIUM HYDROXIDE 400 MG/5ML PO SUSP
30.0000 mL | Freq: Every day | ORAL | Status: DC | PRN
  Administered 2019-10-09: 30 mL via ORAL
  Filled 2019-10-09: qty 30

## 2019-10-09 MED ORDER — POTASSIUM CHLORIDE CRYS ER 20 MEQ PO TBCR
40.0000 meq | EXTENDED_RELEASE_TABLET | Freq: Once | ORAL | Status: AC
  Administered 2019-10-09: 40 meq via ORAL
  Filled 2019-10-09: qty 2

## 2019-10-09 MED FILL — Perflutren Lipid Microsphere IV Susp 1.1 MG/ML: INTRAVENOUS | Qty: 10 | Status: AC

## 2019-10-09 NOTE — Progress Notes (Signed)
  Echocardiogram 2D Echocardiogram has been performed with [facility].  Gerda Diss 10/09/2019, 2:16 PM

## 2019-10-09 NOTE — TOC Progression Note (Signed)
Transition of Care [facility]) - Progression Note    Patient Details  Name: Bruce Barrera MRN: [id number] Date of Birth: Feb 09, 1951  Transition of Care [facility]) CM/SW Contact  Lawerance Sabal, RN Phone Number: 10/09/2019, 11:25 AM  Clinical Narrative:    Independent patient from home w "girlfriend." He states that he has a scale for use for daily weights at home. He drives, has no problems affording medications, and no problems ambulating. CM will continue to follow for Dc planning needs, none identified at this time.  Coverage: [employer], Medicaid PCP- Wilson Singer, MD    Expected Discharge Plan: Home/Self Care Barriers to Discharge: Continued Medical Work up  Expected Discharge Plan and Services Expected Discharge Plan: Home/Self Care                                               Social Determinants of Health (SDOH) Interventions    Readmission Risk Interventions No flowsheet data found.

## 2019-10-09 NOTE — Progress Notes (Addendum)
Subjective:   Bruce Barrera reports he is feeling much better this AM. He is off oxygen at this time. He continues to have good urine output without difficulty, but is having some constipation. He normally uses milk of magnesia at home and has good output within a few hours.   Discussed importance of medication compliance and patient states he has difficulty picking it up at [facility] due to communication difficulty between [facility] and his doctor's office. It has become quite frustrating for him. He is able to pay for medication though.   Objective:  Vital signs in last 24 hours: Vitals:   10/08/19 2341 10/09/19 0057 10/09/19 0344 10/09/19 0737  BP:   125/79 127/81  Pulse:   73 72  Resp: (!) 23 (!) 23 19 18   Temp:   99 F (37.2 C) 97.7 F (36.5 C)  TempSrc:    Tympanic  SpO2: 96% 92% 100% 94%  Weight:      Height:        Physical Exam Vitals and nursing note reviewed.  Constitutional:      General: He is not in acute distress.    Appearance: He is obese.  Neck:     Vascular: No JVD.  Cardiovascular:     Rate and Rhythm: Normal rate and regular rhythm.  Pulmonary:     Effort: Pulmonary effort is normal. No tachypnea or accessory muscle usage.     Breath sounds: Examination of the right-middle field reveals rales. Examination of the left-middle field reveals rales. Examination of the right-lower field reveals rales. Examination of the left-lower field reveals rales. Rales present. No decreased breath sounds, wheezing or rhonchi.  Musculoskeletal:     Right lower leg: No tenderness. Edema present.     Left lower leg: No tenderness. Edema present.  Skin:    General: Skin is warm and dry.     Comments: Hyperpigmentation on bilateral lower calves consistent with venous stasis  Neurological:     General: No focal deficit present.     Mental Status: He is alert and oriented to person, place, and time.  Psychiatric:        Mood and Affect: Mood normal.        Behavior: Behavior normal.     Assessment/Plan:  Active Problems:   Acute respiratory failure with hypoxia [facility])  Bruce Barrera is a 69 year old male with a past medical history of recent ruptured AAA s/p stenting, CAD with multiple MI, ischemic cardiomyopathy, COPD who is currently admitted for acute hypoxic respiratory failure.   # Acute Hypoxic Respiratory Failure  # Ischemic Cardiomyopathy # HFrEF Bruce Barrera is feeling much better and has been able to be weaned to RA with saturation >95% on examination. Given improvement with diuresis, hypoxia likely from acutely decompensated HF due to medication non-compliance. TTE ordered to evaluate for new structural heart disease or wall motion abnormality. Will continue diuresing at this time and re-assess possible discharge tomorrow if continues to improve.    - Telemetry - Supplementation oxygen PRN - Wean oxygen as tolerated  - TTE - Lasix 40 mg BID  - Entresto 24-36 mg BID  - Condom catheter  - Strict in/out - Daily weights     # CAD # Angina  No active ischemic disease.  Continue medical management with aspirin, Plavix, and rosuvastatin.   # COPD Patient is not currently receiving treatment due to financial concerns regarding inhalers. No wheezing on examination today.    # OSA - CPAP overnight   #  Prostate Enlargement Noted on CT A/P on 10/06/19 with dystrophic calcifications as well. On admission, he did note hx of difficult initiating urination. Recommend outpatient follow up with PCP.   Dispo: Anticipated discharge in approximately 1-2 day(s).   Dr. Jose Persia Internal Medicine PGY-1  Pager: [phone] 10/09/2019, 8:15 AM

## 2019-10-10 ENCOUNTER — Encounter: Payer: Medicare Other | Admitting: Vascular Surgery

## 2019-10-10 DIAGNOSIS — Z9114 Patient's other noncompliance with medication regimen: Secondary | ICD-10-CM

## 2019-10-10 DIAGNOSIS — I5023 Acute on chronic systolic (congestive) heart failure: Secondary | ICD-10-CM

## 2019-10-10 LAB — CBC WITH DIFFERENTIAL/PLATELET
Abs Immature Granulocytes: 0.08 10*3/uL — ABNORMAL HIGH (ref 0.00–0.07)
Basophils Absolute: 0 10*3/uL (ref 0.0–0.1)
Basophils Relative: 0 %
Eosinophils Absolute: 0 10*3/uL (ref 0.0–0.5)
Eosinophils Relative: 0 %
HCT: 41.9 % (ref 39.0–52.0)
Hemoglobin: 13.5 g/dL (ref 13.0–17.0)
Immature Granulocytes: 1 %
Lymphocytes Relative: 16 %
Lymphs Abs: 1.1 10*3/uL (ref 0.7–4.0)
MCH: 31.3 pg (ref 26.0–34.0)
MCHC: 32.2 g/dL (ref 30.0–36.0)
MCV: 97.2 fL (ref 80.0–100.0)
Monocytes Absolute: 0.6 10*3/uL (ref 0.1–1.0)
Monocytes Relative: 9 %
Neutro Abs: 5 10*3/uL (ref 1.7–7.7)
Neutrophils Relative %: 74 %
Platelets: 145 10*3/uL — ABNORMAL LOW (ref 150–400)
RBC: 4.31 MIL/uL (ref 4.22–5.81)
RDW: 15.1 % (ref 11.5–15.5)
WBC: 6.8 10*3/uL (ref 4.0–10.5)
nRBC: 0 % (ref 0.0–0.2)

## 2019-10-10 LAB — BASIC METABOLIC PANEL
Anion gap: 9 (ref 5–15)
BUN: 19 mg/dL (ref 8–23)
CO2: 31 mmol/L (ref 22–32)
Calcium: 8 mg/dL — ABNORMAL LOW (ref 8.9–10.3)
Chloride: 94 mmol/L — ABNORMAL LOW (ref 98–111)
Creatinine, Ser: 1.07 mg/dL (ref 0.61–1.24)
GFR calc Af Amer: >60 mL/min (ref >60)
GFR calc non Af Amer: >60 mL/min (ref >60)
Glucose, Bld: 140 mg/dL — ABNORMAL HIGH (ref 70–99)
Potassium: 3.4 mmol/L — ABNORMAL LOW (ref 3.5–5.1)
Sodium: 134 mmol/L — ABNORMAL LOW (ref 135–145)

## 2019-10-10 MED ORDER — SACUBITRIL-VALSARTAN 24-26 MG PO TABS: 1.0000 | ORAL_TABLET | Freq: Two times a day (BID) | ORAL | 0 refills | Status: DC

## 2019-10-10 MED ORDER — CARVEDILOL 3.125 MG PO TABS: 3.1250 mg | ORAL_TABLET | Freq: Two times a day (BID) | ORAL | 0 refills | Status: DC

## 2019-10-10 MED ORDER — LEVOTHYROXINE SODIUM 100 MCG PO TABS: 100.0000 ug | ORAL_TABLET | Freq: Every day | ORAL | 0 refills | Status: DC

## 2019-10-10 MED ORDER — ATORVASTATIN CALCIUM 80 MG PO TABS: 80.0000 mg | ORAL_TABLET | Freq: Every day | ORAL | 0 refills | Status: DC

## 2019-10-10 MED ORDER — FUROSEMIDE 40 MG PO TABS: 40.0000 mg | ORAL_TABLET | Freq: Every day | ORAL | 0 refills | Status: DC

## 2019-10-10 MED ORDER — POTASSIUM CHLORIDE CRYS ER 20 MEQ PO TBCR
40.0000 meq | EXTENDED_RELEASE_TABLET | Freq: Once | ORAL | Status: AC
  Administered 2019-10-10: 40 meq via ORAL
  Filled 2019-10-10: qty 2

## 2019-10-10 MED ORDER — CLOPIDOGREL BISULFATE 75 MG PO TABS: 75.0000 mg | ORAL_TABLET | Freq: Every day | ORAL | 0 refills | Status: DC

## 2019-10-10 MED ORDER — PANTOPRAZOLE SODIUM 40 MG PO TBEC: 40.0000 mg | DELAYED_RELEASE_TABLET | Freq: Every day | ORAL | 0 refills | Status: DC

## 2019-10-10 MED ORDER — ASPIRIN 81 MG PO TABS: 81.0000 mg | ORAL_TABLET | Freq: Every day | ORAL | 0 refills | Status: DC

## 2019-10-10 NOTE — Discharge Instructions (Signed)
° °Acute Respiratory Failure, Adult ° °Acute respiratory failure occurs when there is not enough oxygen passing from your lungs to your body. When this happens, your lungs have trouble removing carbon dioxide from the blood. This causes your blood oxygen level to drop too low as carbon dioxide builds up. °Acute respiratory failure is a medical emergency. It can develop quickly, but it is temporary if treated promptly. Your lung capacity, or how much air your lungs can hold, may improve with time, exercise, and treatment. °What are the causes? °There are many possible causes of acute respiratory failure, including: °· Lung injury. °· Chest injury or damage to the ribs or tissues near the lungs. °· Lung conditions that affect the flow of air and blood into and out of the lungs, such as pneumonia, acute respiratory distress syndrome, and cystic fibrosis. °· Medical conditions, such as strokes or spinal cord injuries, that affect the muscles and nerves that control breathing. °· Blood infection (sepsis). °· Inflammation of the pancreas (pancreatitis). °· A blood clot in the lungs (pulmonary embolism). °· A large-volume blood transfusion. °· Burns. °· Near-drowning. °· Seizure. °· Smoke inhalation. °· Reaction to medicines. °· Alcohol or drug overdose. °What increases the risk? °This condition is more likely to develop in people who have: °· A blocked airway. °· Asthma. °· A condition or disease that damages or weakens the muscles, nerves, bones, or tissues that are involved in breathing. °· A serious infection. °· A health problem that blocks the unconscious reflex that is involved in breathing, such as hypothyroidism or sleep apnea. °· A lung injury or trauma. °What are the signs or symptoms? °Trouble breathing is the main symptom of acute respiratory failure. Symptoms may also include: °· Rapid breathing. °· Restlessness or anxiety. °· Skin, lips, or fingernails that appear blue (cyanosis). °· Rapid heart  rate. °· Abnormal heart rhythms (arrhythmias). °· Confusion or changes in behavior. °· Tiredness or loss of energy. °· Feeling sleepy or having a loss of consciousness. °How is this diagnosed? °Your health care provider can diagnose acute respiratory failure with a medical history and physical exam. During the exam, your health care provider will listen to your heart and check for crackling or wheezing sounds in your lungs. Your may also have tests to confirm the diagnosis and determine what is causing respiratory failure. These tests may include: °· Measuring the amount of oxygen in your blood (pulse oximetry). The measurement comes from a small device that is placed on your finger, earlobe, or toe. °· Other blood tests to measure blood gases and to look for signs of infection. °· Sampling your cerebral spinal fluid or tracheal fluid to check for infections. °· Chest X-ray to look for fluid in spaces that should be filled with air. °· Electrocardiogram (ECG) to look at the heart's electrical activity. °How is this treated? °Treatment for this condition usually takes places in a hospital intensive care unit (ICU). Treatment depends on what is causing the condition. It may include one or more treatments until your symptoms improve. Treatment may include: °· Supplemental oxygen. Extra oxygen is given through a tube in the nose, a face mask, or a hood. °· A device such as a continuous positive airway pressure (CPAP) or bi-level positive airway pressure (BiPAP or BPAP) machine. This treatment uses mild air pressure to keep the airways open. A mask or other device will be placed over your nose or mouth. A tube that is connected to a motor will deliver oxygen through   the mask. °· Ventilator. This treatment helps move air into and out of the lungs. This may be done with a bag and mask or a machine. For this treatment, a tube is placed in your windpipe (trachea) so air and oxygen can flow to the lungs. °· Extracorporeal  membrane oxygenation (ECMO). This treatment temporarily takes over the function of the heart and lungs, supplying oxygen and removing carbon dioxide. ECMO gives the lungs a chance to recover. It may be used if a ventilator is not effective. °· Tracheostomy. This is a procedure that creates a hole in the neck to insert a breathing tube. °· Receiving fluids and medicines. °· Rocking the bed to help breathing. °Follow these instructions at home: °· Take over-the-counter and prescription medicines only as told by your health care provider. °· Return to normal activities as told by your health care provider. Ask your health care provider what activities are safe for you. °· Keep all follow-up visits as told by your health care provider. This is important. °How is this prevented? °Treating infections and medical conditions that may lead to acute respiratory failure can help prevent the condition from developing. °Contact a health care provider if: °· You have a fever. °· Your symptoms do not improve or they get worse. °Get help right away if: °· You are having trouble breathing. °· You lose consciousness. °· Your have cyanosis or turn blue. °· You develop a rapid heart rate. °· You are confused. °These symptoms may represent a serious problem that is an emergency. Do not wait to see if the symptoms will go away. Get medical help right away. Call your local emergency services (911 in the [place] Do not drive yourself to the hospital. °This information is not intended to replace advice given to you by your health care provider. Make sure you discuss any questions you have with your health care provider. °Document Revised: 08/10/2017 Document Reviewed: 03/15/2016 °Elsevier Patient Education © 2020 [employer] °Acute Respiratory Failure, Adult ° °Acute respiratory failure occurs when there is not enough oxygen passing from your lungs to your body. When this happens, your lungs have trouble removing carbon dioxide from the  blood. This causes your blood oxygen level to drop too low as carbon dioxide builds up. °Acute respiratory failure is a medical emergency. It can develop quickly, but it is temporary if treated promptly. Your lung capacity, or how much air your lungs can hold, may improve with time, exercise, and treatment. °What are the causes? °There are many possible causes of acute respiratory failure, including: °· Lung injury. °· Chest injury or damage to the ribs or tissues near the lungs. °· Lung conditions that affect the flow of air and blood into and out of the lungs, such as pneumonia, acute respiratory distress syndrome, and cystic fibrosis. °· Medical conditions, such as strokes or spinal cord injuries, that affect the muscles and nerves that control breathing. °· Blood infection (sepsis). °· Inflammation of the pancreas (pancreatitis). °· A blood clot in the lungs (pulmonary embolism). °· A large-volume blood transfusion. °· Burns. °· Near-drowning. °· Seizure. °· Smoke inhalation. °· Reaction to medicines. °· Alcohol or drug overdose. °What increases the risk? °This condition is more likely to develop in people who have: °· A blocked airway. °· Asthma. °· A condition or disease that damages or weakens the muscles, nerves, bones, or tissues that are involved in breathing. °· A serious infection. °· A health problem that blocks the unconscious reflex that is involved   in breathing, such as hypothyroidism or sleep apnea. °· A lung injury or trauma. °What are the signs or symptoms? °Trouble breathing is the main symptom of acute respiratory failure. Symptoms may also include: °· Rapid breathing. °· Restlessness or anxiety. °· Skin, lips, or fingernails that appear blue (cyanosis). °· Rapid heart rate. °· Abnormal heart rhythms (arrhythmias). °· Confusion or changes in behavior. °· Tiredness or loss of energy. °· Feeling sleepy or having a loss of consciousness. °How is this diagnosed? °Your health care provider can  diagnose acute respiratory failure with a medical history and physical exam. During the exam, your health care provider will listen to your heart and check for crackling or wheezing sounds in your lungs. Your may also have tests to confirm the diagnosis and determine what is causing respiratory failure. These tests may include: °· Measuring the amount of oxygen in your blood (pulse oximetry). The measurement comes from a small device that is placed on your finger, earlobe, or toe. °· Other blood tests to measure blood gases and to look for signs of infection. °· Sampling your cerebral spinal fluid or tracheal fluid to check for infections. °· Chest X-ray to look for fluid in spaces that should be filled with air. °· Electrocardiogram (ECG) to look at the heart's electrical activity. °How is this treated? °Treatment for this condition usually takes places in a hospital intensive care unit (ICU). Treatment depends on what is causing the condition. It may include one or more treatments until your symptoms improve. Treatment may include: °· Supplemental oxygen. Extra oxygen is given through a tube in the nose, a face mask, or a hood. °· A device such as a continuous positive airway pressure (CPAP) or bi-level positive airway pressure (BiPAP or BPAP) machine. This treatment uses mild air pressure to keep the airways open. A mask or other device will be placed over your nose or mouth. A tube that is connected to a motor will deliver oxygen through the mask. °· Ventilator. This treatment helps move air into and out of the lungs. This may be done with a bag and mask or a machine. For this treatment, a tube is placed in your windpipe (trachea) so air and oxygen can flow to the lungs. °· Extracorporeal membrane oxygenation (ECMO). This treatment temporarily takes over the function of the heart and lungs, supplying oxygen and removing carbon dioxide. ECMO gives the lungs a chance to recover. It may be used if a ventilator is  not effective. °· Tracheostomy. This is a procedure that creates a hole in the neck to insert a breathing tube. °· Receiving fluids and medicines. °· Rocking the bed to help breathing. °Follow these instructions at home: °· Take over-the-counter and prescription medicines only as told by your health care provider. °· Return to normal activities as told by your health care provider. Ask your health care provider what activities are safe for you. °· Keep all follow-up visits as told by your health care provider. This is important. °How is this prevented? °Treating infections and medical conditions that may lead to acute respiratory failure can help prevent the condition from developing. °Contact a health care provider if: °· You have a fever. °· Your symptoms do not improve or they get worse. °Get help right away if: °· You are having trouble breathing. °· You lose consciousness. °· Your have cyanosis or turn blue. °· You develop a rapid heart rate. °· You are confused. °These symptoms may represent a serious problem that is   an emergency. Do not wait to see if the symptoms will go away. Get medical help right away. Call your local emergency services (911 in the [place] Do not drive yourself to the hospital. °This information is not intended to replace advice given to you by your health care provider. Make sure you discuss any questions you have with your health care provider. °Document Revised: 08/10/2017 Document Reviewed: 03/15/2016 °Elsevier Patient Education © 2020 [employer] ° °

## 2019-10-10 NOTE — TOC Transition Note (Signed)
Transition of Care [facility]) - CM/SW Discharge Note   Patient Details  Name: Bruce Barrera MRN: [id number] Date of Birth: 31-Mar-1951  Transition of Care [facility]) CM/SW Contact:  Kermit Balo, RN Phone Number: 10/10/2019, 11:27 AM   Clinical Narrative:    Pt discharging home with self care. Pt has insurance, hospital f/u and transportation home.   Final next level of care: Home/Self Care Barriers to Discharge: No Barriers Identified   Patient Goals and CMS Choice Patient states their goals for this hospitalization and ongoing recovery are:: to return home      Discharge Placement                       Discharge Plan and Services                                     Social Determinants of Health (SDOH) Interventions     Readmission Risk Interventions No flowsheet data found.

## 2019-10-10 NOTE — Progress Notes (Signed)
Patient is A+ O x4 . Awaiting DC Patient requesting to leave with external catheter because of distance.

## 2019-10-10 NOTE — Discharge Summary (Signed)
Name: Bruce Barrera MRN: [id number] DOB: 1951/05/16 69 y.o. PCP: Bruce Singer, MD  Date of Admission: 10/08/2019  2:17 PM Date of Discharge: 10/10/2019 Attending Physician: Bruce Barrera, *  Discharge Diagnosis: 1. Acute hypoxic respiratory failure  2. Acute decompensation of HFpEF  Discharge Medications: Allergies as of 10/10/2019   No Known Allergies     Medication List    STOP taking these medications   HYDROcodone-acetaminophen 5-325 MG tablet Commonly known as: Norco   ondansetron 4 MG disintegrating tablet Commonly known as: Zofran ODT   triamterene-hydrochlorothiazide 37.5-25 MG tablet Commonly known as: MAXZIDE-25     TAKE these medications   aspirin 81 MG tablet Take 1 tablet (81 mg total) by mouth at bedtime.   atorvastatin 80 MG tablet Commonly known as: LIPITOR Take 1 tablet (80 mg total) by mouth daily.   carvedilol 3.125 MG tablet Commonly known as: COREG Take 1 tablet (3.125 mg total) by mouth 2 (two) times daily with a meal.   clopidogrel 75 MG tablet Commonly known as: PLAVIX Take 1 tablet (75 mg total) by mouth daily.   furosemide 40 MG tablet Commonly known as: Lasix Take 1 tablet (40 mg total) by mouth daily.   levothyroxine 100 MCG tablet Commonly known as: Synthroid Take 1 tablet (100 mcg total) by mouth daily before breakfast.   pantoprazole 40 MG tablet Commonly known as: PROTONIX Take 1 tablet (40 mg total) by mouth at bedtime.   sacubitril-valsartan 24-26 MG Commonly known as: ENTRESTO Take 1 tablet by mouth 2 (two) times daily.       Disposition and follow-up:   Bruce Barrera was discharged from [facility] in Good condition.  At the hospital follow up visit please address:  1.   - Please ensure patient continues to take HFpEF medications daily - Discontinue triamterene-HCTZ and started patient on Lasix, please ensure patient is tolerating - Patient complained of difficulty urinating  and previous imaging showed prostamegaly, may benefit from urology referral  2.  Labs / imaging needed at time of follow-up: BMP  3.  Pending labs/ test needing follow-up: None   Follow-up Appointments: Follow-up Information    Bruce Singer, MD. Schedule an appointment as soon as possible for a visit in 1 week(s).   Specialty: Internal Medicine Contact information: [address] [place] [address] [phone]        Bruce Sidle, MD .   Specialty: Cardiology Contact information: [address] [place] [address] [phone]           Hospital Course by problem list: 1. Acute hypoxic respiratory failure  Bruce Barrera presented to the ED on 1/27 with acute intermittent shortness of breath.  He was previously seen for this 2 days prior and he been but was frustrated that no clear diagnosis was given to them.  He was also complaining of worsening lower extremity edema.  He noted that he had not taken any of his heart failure medications in at least over a week.  While in the ED, he received 80 mg of IV Lasix and noted significant symptomatic improvement.  Acute hypoxic respiratory failure at this point was felt to be secondary to acute decompensation of HFpEF.  Patient did have an elevated D-dimer and history of recent hospitalization however would not have suspected such quick improvement in symptoms with diuresis, so CTA was not pursued.  Chest x-ray showed after pulmonary edema consistent with fluid overload.  With continued diuresis  overnight, patient was weaned to room air by morning of second day of hospitalization.  He continued to symptomatically improve with continued diuresis.  On discharge, triamterene-HCTZ was discontinued as unlikely to provide adequate diuresis.  He was started on Lasix 40 mg twice daily.  All of his medications on his med list were refilled to ensure he is compliant until able to follow up with PCP.  2. Acute decompensation of  HFpEF Secondary to medication noncompliance.  Due to concern for reduction in EF, TTE was obtained that did not show any significant changes from prior TTE in 2019.  EF was still 50 to 55%.  Patient was restarted on his prior medications with the exception of triamterene-HCTZ which was switched for Lasix 40 mg twice daily.  Strongly recommended that patient follow-up with cardiology and his PCP.   Discharge Vitals:   BP 125/74   Pulse 69   Temp 98.2 F (36.8 C) (Oral)   Resp 17   Ht 5\' 11"  (1.803 m)   Wt 112.3 kg   SpO2 90%   BMI 34.53 kg/m   Pertinent Labs, Studies, and Procedures:   CBC Latest Ref Rng & Units 10/10/2019 10/08/2019 09/15/2019  WBC 4.0 - 10.5 K/uL 6.8 5.5 12.6(H)  Hemoglobin 13.0 - 17.0 g/dL 11/13/2019 67.6 11.7(L)  Hematocrit 39.0 - 52.0 % 41.9 40.7 36.5(L)  Platelets 150 - 400 K/uL 145(L) 162 208   BMP Latest Ref Rng & Units 10/10/2019 10/09/2019 10/08/2019  Glucose 70 - 99 mg/dL 10/10/2019) 947(S) 962(E)  BUN 8 - 23 mg/dL 19 16 15   Creatinine 0.61 - 1.24 mg/dL 366(Q 9.47  BUN/Creat Ratio 6 - 22 (calc) - - -  Sodium 135 - 145 mmol/L 134(L) 133(L) 138  Potassium 3.5 - 5.1 mmol/L 3.4(L) 3.1(L) 3.8  Chloride 98 - 111 mmol/L 94(L) 94(L) 101  CO2 22 - 32 mmol/L 31 29 28   Calcium 8.9 - 10.3 mg/dL 8.0(L) 8.5(L) 8.4(L)   CXR: 10/08/2019 IMPRESSION: Mildly increased interstitial markings particularly at the lung bases may represent mild edema or pneumonitis.  TTE: 10/09/2019 IMPRESSIONS  1. Left ventricular ejection fraction, by visual estimation, is 50 to  55%. The left ventricle has mildly decreased function. Left ventricular  septal wall thickness was mildly increased. Mildly increased left  ventricular posterior wall thickness. There  is mildly increased left ventricular hypertrophy.  2. Definity contrast agent was given IV to delineate the left ventricular  endocardial borders.  3. Elevated left ventricular end-diastolic pressure.  4. The left ventricle  demonstrates regional wall motion abnormalities.  5. Apical anterior apical septal, and apical hypokinesis.  6. Global right ventricle has normal systolic function.The right  ventricular size is normal. No increase in right ventricular wall  thickness.  7. Left atrial size was normal.  8. Right atrial size was normal.  9. The mitral valve is normal in structure. Trivial mitral valve  regurgitation. No evidence of mitral stenosis.  10. The tricuspid valve is normal in structure.  11. The tricuspid valve is normal in structure. Tricuspid valve  regurgitation is trivial.  12. The aortic valve is normal in structure. Aortic valve regurgitation is  not visualized. No evidence of aortic valve sclerosis or stenosis.  13. The pulmonic valve was normal in structure. Pulmonic valve  regurgitation is not visualized.  14. Normal pulmonary artery systolic pressure.  15. The inferior vena cava is dilated in size with >50% respiratory  variability, suggesting right atrial pressure of 8 mmHg.  Discharge Instructions: Discharge  Instructions    (HEART FAILURE PATIENTS) Call MD:  Anytime you have any of the following symptoms: 1) 3 pound weight gain in 24 hours or 5 pounds in 1 week 2) shortness of breath, with or without a dry hacking cough 3) swelling in the hands, feet or stomach 4) if you have to sleep on extra pillows at night in order to breathe.   Complete by: As directed    Call MD for:  difficulty breathing, headache or visual disturbances   Complete by: As directed    Call MD for:  persistant dizziness or light-headedness   Complete by: As directed    Call MD for:  persistant nausea and vomiting   Complete by: As directed    Call MD for:  severe uncontrolled pain   Complete by: As directed    Call MD for:  temperature >100.4   Complete by: As directed    Diet - low sodium heart healthy   Complete by: As directed    Discharge instructions   Complete by: As directed    Mr. Chrles, Selley were admitted to the hospital due to volume overload that lead to shortness of breathe. It is important to take all your medications daily as prescribed to avoid future symptoms.   To ensure your heart failure and volume level is well controlled, we discontinued your Triamterene-HCTZ (Maxzide). We started a new medication called Furosemide (Lasix), which is what we used in the hospital. Please take 1 tablet every day. If you notice your weight increasing more than 3 pounds in 1 day, or 5 pounds in 2 days, please take 1 extra dose of Furosemide and call your primary care doctor.   It was a pleasure meeting you!   Increase activity slowly   Complete by: As directed       Signed: Dr. Jose Persia Internal Medicine PGY-1  Pager: [phone] 10/10/2019, 11:27 AM

## 2019-10-10 NOTE — Progress Notes (Signed)
   Subjective:   Mr. Broner is feeling this morning. Endorses that breathing is back to baseline. He has had good urine output. He is anxious to get home. We agreed to this plan, but emphasized importance of medication compliance and following up with his PCP next week.   Objective:  Vital signs in last 24 hours: Vitals:   10/10/19 0507 10/10/19 0540 10/10/19 0545 10/10/19 0610  BP:  118/71  125/74  Pulse:      Resp: 20 18 19 17   Temp:      TempSrc:      SpO2: 95% 91% 92% 90%  Weight:      Height:        Physical Exam Vitals and nursing note reviewed.  Constitutional:      General: He is not in acute distress.    Appearance: He is obese.  Pulmonary:     Effort: Pulmonary effort is normal. No tachypnea or respiratory distress.     Breath sounds: Examination of the right-lower field reveals rales. Examination of the left-lower field reveals rales. Rales (Fine crackles with significant improvement from the day prior) present.  Musculoskeletal:     Right lower leg: Edema (2+ pitting) present.     Left lower leg: Edema (2+ pitting) present.  Neurological:     General: No focal deficit present.     Mental Status: He is alert and oriented to person, place, and time.  Psychiatric:        Mood and Affect: Mood normal.        Behavior: Behavior normal.    Assessment/Plan:  Principal Problem:   Acute on chronic heart failure with preserved ejection fraction (HFpEF) (HCC) Active Problems:   Essential hypertension, benign   Acute respiratory failure with hypoxia [facility])  Mr. Carrie Schoonmaker is a26 year old male with a past medical history of recent ruptured AAAs/pstenting, CAD with multiple MI, ischemic cardiomyopathy, COPD whois currently admitted for acute hypoxic respiratory failure.   # Acute Hypoxic Respiratory Failure # Ischemic Cardiomyopathy # HFrEF TTE demonstrated EF of 50-55% with slight increase in hypertrophy and decrease in LV function. No significant change  from prior in 2019. Net out is -1200 mL. Given improvement in pulm exam and patient's desire to go home, will discharge today with clear instructions to follow up with PCP. At this time, plan to discharge with Lasix and discontinue triamterene-HCTZ, as diuretic effect may be inadequate for Mr. Wedin.   - Lasix 40 mg BID  - Entresto 24-36 mg BID  # COPD Patient is not currently receiving treatment due to financial concerns regarding inhalers. No wheezing on examination today.   # OSA - CPAP overnight  - Recommend he follow up with PCP to get a home CPAP  # Prostate Enlargement Noted on CT A/P on 10/06/19 with dystrophic calcifications as well. On admission, he did note hx of difficult initiating urination. Recommend outpatient follow up with PCP.   Dispo: Anticipated discharge today.    Dr. 10/08/19 Internal Medicine PGY-1  Pager: [phone] 10/10/2019, 6:42 AM

## 2019-10-13 LAB — CULTURE, BLOOD (ROUTINE X 2)
Culture: NO GROWTH
Culture: NO GROWTH
Special Requests: ADEQUATE

## 2019-10-21 ENCOUNTER — Other Ambulatory Visit: Payer: Self-pay

## 2019-10-21 ENCOUNTER — Ambulatory Visit (INDEPENDENT_AMBULATORY_CARE_PROVIDER_SITE_OTHER): Payer: Medicare Other | Admitting: Internal Medicine

## 2019-10-21 ENCOUNTER — Encounter (INDEPENDENT_AMBULATORY_CARE_PROVIDER_SITE_OTHER): Payer: Self-pay | Admitting: Internal Medicine

## 2019-10-21 VITALS — BP 132/81 | HR 89 | Temp 98.0°F | Resp 18 | Ht 71.0 in | Wt 251.8 lb

## 2019-10-21 DIAGNOSIS — G4730 Sleep apnea, unspecified: Secondary | ICD-10-CM

## 2019-10-21 DIAGNOSIS — I10 Essential (primary) hypertension: Secondary | ICD-10-CM | POA: Diagnosis not present

## 2019-10-21 DIAGNOSIS — M170 Bilateral primary osteoarthritis of knee: Secondary | ICD-10-CM | POA: Diagnosis not present

## 2019-10-21 DIAGNOSIS — B070 Plantar wart: Secondary | ICD-10-CM

## 2019-10-21 DIAGNOSIS — N400 Enlarged prostate without lower urinary tract symptoms: Secondary | ICD-10-CM | POA: Diagnosis not present

## 2019-10-21 DIAGNOSIS — J449 Chronic obstructive pulmonary disease, unspecified: Secondary | ICD-10-CM | POA: Diagnosis not present

## 2019-10-21 LAB — COMPLETE METABOLIC PANEL WITH GFR
AG Ratio: 1.3 (calc) (ref 1.0–2.5)
ALT: 15 U/L (ref 9–46)
AST: 14 U/L (ref 10–35)
Albumin: 3.8 g/dL (ref 3.6–5.1)
Alkaline phosphatase (APISO): 127 U/L (ref 35–144)
BUN: 19 mg/dL (ref 7–25)
CO2: 31 mmol/L (ref 20–32)
Calcium: 8.9 mg/dL (ref 8.6–10.3)
Chloride: 101 mmol/L (ref 98–110)
Creat: 1.01 mg/dL (ref 0.70–1.25)
GFR, Est African American: 89 mL/min/{1.73_m2} (ref >=60)
GFR, Est Non African American: 77 mL/min/{1.73_m2} (ref >=60)
Globulin: 2.9 g/dL (calc) (ref 1.9–3.7)
Glucose, Bld: 165 mg/dL — ABNORMAL HIGH (ref 65–99)
Potassium: 4.4 mmol/L (ref 3.5–5.3)
Sodium: 138 mmol/L (ref 135–146)
Total Bilirubin: 0.6 mg/dL (ref 0.2–1.2)
Total Protein: 6.7 g/dL (ref 6.1–8.1)

## 2019-10-21 LAB — PSA: PSA: 2.4 ng/mL (ref <=4.0)

## 2019-10-21 NOTE — Progress Notes (Signed)
Metrics: Intervention Frequency ACO  Documented Smoking Status Yearly  Screened one or more times in 24 months  Cessation Counseling or  Active cessation medication Past 24 months  Past 24 months   Guideline developer: UpToDate (See UpToDate for funding source) Date Released: 2014       Wellness Office Visit  Subjective:  Patient ID: Bruce Barrera, male    DOB: April 11, 1951  Age: 69 y.o. MRN: [id number]  CC: This is a hospital follow-up visit.  This patient was admitted towards the end of January with dyspnea.  He previously had been operated on with ruptured abdominal aortic aneurysm.  This was operated on and he survived. HPI On the admission in January, he was found to be in acute congestive heart failure.  He was treated appropriately and was discharged just over 10 days ago.  He is doing reasonably well but there are a few things he had to discuss with me whilst he was in the hospital. CT scan apparently showed an enlarged prostate. He also requires CPAP machine and portable oxygen. He also requires a lift chair because of severe osteoarthritis of his knees which makes it difficult for him to stand up and sit down. He also requires form filled out for disability parking.  Past Medical History:  Diagnosis Date  . Arthritis    Hips and knees  . Back pain   . Chronic systolic CHF (congestive heart failure) (HCC)   . Coronary artery disease    a. 03/2016 subacute anterior MI -> 100% prox LAD >72 hours out thus treated medically, LVEF 30-35% initially  . Essential hypertension   . GERD (gastroesophageal reflux disease)   . Hemorrhoids   . History of stroke    States "stroke" with left sided weakness 5/95 in prison in [place]  . Hyperlipidemia   . Hypothyroidism   . Ischemic cardiomyopathy   . MI (myocardial infarction) (HCC)    States "heart attack" 2/95 in prison in [place]  . Obesity   . Peptic ulcer    GIB 1994  . Sleep apnea   . Vitamin D deficiency disease 06/19/2019       Family History  Problem Relation Age of Onset  . Hypertension Mother   . Diabetes Mother   . Heart disease Mother   . Stroke Mother   . Stroke Father   . Heart attack Brother   . Aneurysm Brother   . Cirrhosis Brother     Social History   Social History Narrative   Lives with girlfriend for last 8 years.On disability .   Social History   Tobacco Use  . Smoking status: Current Every Day Smoker    Packs/day: 2.00    Types: Cigarettes    Start date: 05/18/1961  . Smokeless tobacco: Never Used  Substance Use Topics  . Alcohol use: No    Current Meds  Medication Sig  . aspirin 81 MG tablet Take 1 tablet (81 mg total) by mouth at bedtime.  Marland Kitchen atorvastatin (LIPITOR) 80 MG tablet Take 1 tablet (80 mg total) by mouth daily.  . carvedilol (COREG) 3.125 MG tablet Take 1 tablet (3.125 mg total) by mouth 2 (two) times daily with a meal.  . clopidogrel (PLAVIX) 75 MG tablet Take 1 tablet (75 mg total) by mouth daily.  . furosemide (LASIX) 40 MG tablet Take 1 tablet (40 mg total) by mouth daily.  Marland Kitchen levothyroxine (SYNTHROID) 100 MCG tablet Take 1 tablet (100 mcg total) by mouth daily before breakfast.  .  pantoprazole (PROTONIX) 40 MG tablet Take 1 tablet (40 mg total) by mouth at bedtime.  . sacubitril-valsartan (ENTRESTO) 24-26 MG Take 1 tablet by mouth 2 (two) times daily.      Objective:   Today's Vitals: BP 132/81 (BP Location: Right Arm, Patient Position: Sitting, Cuff Size: Normal)   Pulse 89   Temp 98 F (36.7 C)   Resp 18   Ht 5\' 11"  (1.803 m)   Wt 251 lb 12.8 oz (114.2 kg)   SpO2 98% Comment: wearing mask.  BMI 35.12 kg/m  Vitals with BMI 10/21/2019 10/10/2019 10/10/2019  Height 5\' 11"  - -  Weight 251 lbs 13 oz - -  BMI 06.30 - -  Systolic 160 109 323  Diastolic 81 74 71  Pulse 89 - -     Physical Exam  He is obese.  He is chronically unwell.  Blood pressure is controlled.  Lung fields are clear.  Heart sounds are present without gallop rhythm.  He is  clinically not in heart failure.     Assessment   1. Enlarged prostate   2. Osteoarthritis of both knees, unspecified osteoarthritis type   3. Essential hypertension, benign   4. Plantar wart of right foot   5. Chronic obstructive pulmonary disease, unspecified COPD type ([place])   6. Sleep apnea, unspecified type       Tests ordered Orders Placed This Encounter  Procedures  . For home use only DME oxygen  . For home use only DME continuous positive airway pressure (CPAP)  . For home use only DME Other see comment  . PSA  . COMPLETE METABOLIC PANEL WITH GFR     Plan: 1. Blood work is ordered as above. 2. We will get DME for CPAP, portable oxygen and lift chair. 3. I will sign a form that will allow him to get disability parking. 4. Further recommendations will depend on blood results and he will follow-up with Sarah in about 3 months time.  In the meantime, he will continue with all medications as above.   No orders of the defined types were placed in this encounter.   Doree Albee, MD

## 2019-11-04 ENCOUNTER — Other Ambulatory Visit (INDEPENDENT_AMBULATORY_CARE_PROVIDER_SITE_OTHER): Payer: Self-pay | Admitting: Internal Medicine

## 2019-11-06 ENCOUNTER — Other Ambulatory Visit (INDEPENDENT_AMBULATORY_CARE_PROVIDER_SITE_OTHER): Payer: Self-pay

## 2019-11-06 DIAGNOSIS — G4730 Sleep apnea, unspecified: Secondary | ICD-10-CM

## 2019-11-06 DIAGNOSIS — Z7189 Other specified counseling: Secondary | ICD-10-CM

## 2019-11-06 NOTE — Progress Notes (Signed)
Verbal order to send in for referral for [facility]NA & sleep study.

## 2019-11-10 ENCOUNTER — Other Ambulatory Visit: Payer: Self-pay | Admitting: Internal Medicine

## 2019-11-11 ENCOUNTER — Other Ambulatory Visit (INDEPENDENT_AMBULATORY_CARE_PROVIDER_SITE_OTHER): Payer: Self-pay | Admitting: Internal Medicine

## 2019-11-14 ENCOUNTER — Encounter: Payer: Self-pay | Admitting: Vascular Surgery

## 2019-11-14 ENCOUNTER — Other Ambulatory Visit: Payer: Self-pay

## 2019-11-14 ENCOUNTER — Ambulatory Visit (INDEPENDENT_AMBULATORY_CARE_PROVIDER_SITE_OTHER): Payer: Self-pay | Admitting: Vascular Surgery

## 2019-11-14 VITALS — BP 167/100 | HR 90 | Temp 97.2°F | Resp 20 | Ht 71.0 in | Wt 255.2 lb

## 2019-11-14 DIAGNOSIS — I713 Abdominal aortic aneurysm, ruptured, unspecified: Secondary | ICD-10-CM

## 2019-11-14 NOTE — Progress Notes (Signed)
    Subjective:     Patient ID: Bruce Barrera, male   DOB: 02-Mar-1951, 69 y.o.   MRN: [id number]  HPI 69 year old male underwent endovascular aneurysm repair for rupture.  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  He has had an MI and been treated for this.  Has 8 previous MIs in the last several years.  Currently doing well.  Walking with help of a cane.  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  Given that he is recently had an MI and ruptured aneurysm would not proceed with any intervention at this time.  He will follow-up in a few months with CT scan.  I do not think that a duplex will be reasonable given his size and also we would want to see his endoleak's to evaluated that time for possible need for intervention.     Assessment:     69 year old male follows up for recent repair of ruptured abdominal aortic aneurysm.  There is a type II endoleak from an IMA.  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  Given all that is happened we will follow him up in a few months with CT scan to evaluate the aneurysm.    Krystan Northrop C. Randie Heinz, MD [facility] Office: [phone] Pager: [phone]

## 2019-11-18 ENCOUNTER — Other Ambulatory Visit: Payer: Self-pay

## 2019-11-18 NOTE — Patient Outreach (Signed)
[facility]) Care Management  11/18/2019  Braylan Faul 1951/01/08 [id number]   Medication Adherence call to Mr. Conan Mcmanaway Telephone call to Patient regarding Medication Adherence unable to reach patient. Mr. Barnier is showing past due on Atorvastatin 80 mg under [employer].   Lillia Abed CPhT Pharmacy Technician [facility] Management Direct Dial [phone]  Fax [phone] [email or web address]

## 2019-11-20 ENCOUNTER — Other Ambulatory Visit: Payer: Self-pay

## 2019-11-20 ENCOUNTER — Ambulatory Visit (INDEPENDENT_AMBULATORY_CARE_PROVIDER_SITE_OTHER): Payer: Medicare Other | Admitting: Neurology

## 2019-11-20 ENCOUNTER — Encounter: Payer: Self-pay | Admitting: Neurology

## 2019-11-20 VITALS — BP 151/94 | HR 80 | Temp 97.6°F | Ht 71.0 in | Wt 258.5 lb

## 2019-11-20 DIAGNOSIS — F17200 Nicotine dependence, unspecified, uncomplicated: Secondary | ICD-10-CM

## 2019-11-20 DIAGNOSIS — G4733 Obstructive sleep apnea (adult) (pediatric): Secondary | ICD-10-CM

## 2019-11-20 DIAGNOSIS — E669 Obesity, unspecified: Secondary | ICD-10-CM | POA: Diagnosis not present

## 2019-11-20 DIAGNOSIS — R600 Localized edema: Secondary | ICD-10-CM | POA: Diagnosis not present

## 2019-11-20 DIAGNOSIS — I5022 Chronic systolic (congestive) heart failure: Secondary | ICD-10-CM

## 2019-11-20 DIAGNOSIS — Z8673 Personal history of transient ischemic attack (TIA), and cerebral infarction without residual deficits: Secondary | ICD-10-CM | POA: Diagnosis not present

## 2019-11-20 DIAGNOSIS — R351 Nocturia: Secondary | ICD-10-CM

## 2019-11-20 DIAGNOSIS — J449 Chronic obstructive pulmonary disease, unspecified: Secondary | ICD-10-CM

## 2019-11-20 NOTE — Progress Notes (Signed)
Subjective:    Patient ID: Bruce Barrera, male    DOB: 1950-10-18, 69 y.o.   MRN: [id number]  HPI    Huston Foley, MD, PhD [facility] [address], [address] [address] [place], [place] [address]  Dear Dr. Karilyn Cota,   I saw your patient, Bruce Barrera, upon your kind request in my sleep clinic today for initial consultation of his sleep disorder, in particular, evaluation of his prior diagnosis of OSA.  The patient is unaccompanied today.  As you know, Bruce Barrera is a 69 year old right-handed gentleman with an underlying medical history of heart disease with history of ischemic cardiomyopathy, heart attack, history of stroke, hypothyroidism, hyperlipidemia, reflux disease, hypertension, chronic systolic CHF, AAA, vitamin D deficiency, peptic ulcer, arthritis and obesity, who was previously diagnosed with obstructive sleep apnea and placed on CPAP therapy.  Prior sleep study results are not available for my review today.  He no longer has a CPAP machine.  His sister gave him a CPAP machine but he has not used it.  I reviewed your office note from 10/21/2019.  His Epworth sleepiness score is 5 out of 24, fatigue severity score is 53 out of 63.  He had sleep study testing in 2018, testing was done in [place], [place].  He had supplemental oxygen but no longer has oxygen.  I was able to review an office note which indicated in 2018 that he was supposed to be set up for CPAP at home and then could discontinue supplemental oxygen once established on CPAP.  He reports that his machine was dirty.  He did not realize that he had to clean it.  He purchased a so clean machine.  His sister gave him one of her old machines but it is not usable.  He checked with his DME company, [employer] and was told that it was not adjustable.  He does not keep a schedule for his sleep, he may sleep at night or during the day.  He is not planning on working on changing his sleep  schedule to a set schedule.  He is working on smoking cessation, currently averages a pack per day, down from 2-1/2 packs/day.  He lives with his landlady.  He wakes up choking.  He has nocturia 3-4 times per night, no AM HAs.  Used to see a pulmonologist and reports a history of COPD, does not actually have a follow-up to see a lung specialist.    His Past Medical History Is Significant For: Past Medical History:  Diagnosis Date  . AAA (abdominal aortic aneurysm) (HCC)   . Arthritis    Hips and knees  . Back pain   . Chronic systolic CHF (congestive heart failure) (HCC)   . Coronary artery disease    a. 03/2016 subacute anterior MI -> 100% prox LAD >72 hours out thus treated medically, LVEF 30-35% initially  . Essential hypertension   . GERD (gastroesophageal reflux disease)   . Hemorrhoids   . History of stroke    States "stroke" with left sided weakness 5/95 in prison in [place]  . Hyperlipidemia   . Hypothyroidism   . Ischemic cardiomyopathy   . MI (myocardial infarction) (HCC)    States "heart attack" 2/95 in prison in [place]  . Obesity   . Peptic ulcer    GIB 1994  . Sleep apnea   . Vitamin D deficiency disease 06/19/2019    His Past Surgical History Is Significant For: Past Surgical History:  Procedure Laterality Date  . ABDOMINAL AORTIC ENDOVASCULAR STENT GRAFT N/A 09/09/2019   Procedure: ABDOMINAL AORTIC ENDOVASCULAR STENT GRAFT;  Surgeon: Maeola Harman, MD;  Location[facility] OR;  Service: Vascular;  Laterality: N/A;  . ADJUSTABLE SUTURE MANIPULATION Left 04/10/2018   Procedure: ADJUSTABLE SUTURE MANIPULATION;  Surgeon: Aura Camps, MD;  Location: [facility] OR;  Service: Ophthalmology;  Laterality: Left;  . CARDIAC CATHETERIZATION N/A 03/15/2016   Procedure: Right/Left Heart Cath and Coronary Angiography;  Surgeon: Iran Ouch, MD;  Location: [facility] INVASIVE CV LAB;  Service: Cardiovascular;  Laterality: N/A;  . COLONOSCOPY  03/23/2011   hemorrhoids  . KNEE ARTHROSCOPY      Right knee x 2  . MEDIAN RECTUS REPAIR Left 04/10/2018   Procedure: LEFT MEDIAN RECTUS REPAIR;  Surgeon: Aura Camps, MD;  [facility] OR;  Service: Ophthalmology;  Laterality: Left;  Marland Kitchen MUSCLE RECESSION AND RESECTION Left 04/10/2018   Procedure: LEFT MUSCLE RECESSION;  Surgeon: Aura Camps, MD;  Location: [facility] OR;  Service: Ophthalmology;  Laterality: Left;  . TOTAL HIP ARTHROPLASTY Right 2014    His Family History Is Significant For: Family History  Problem Relation Age of Onset  . Hypertension Mother   . Diabetes Mother   . Heart disease Mother   . Stroke Mother   . Stroke Father   . Heart attack Brother   . Aneurysm Brother   . Cirrhosis Brother     His Social History Is Significant For: Social History   Socioeconomic History  . Marital status: Divorced    Spouse name: Not on file  . Number of children: Not on file  . Years of education: Not on file  . Highest education level: Not on file  Occupational History  . Occupation: Disabled    Comment: Carpenter  Tobacco Use  . Smoking status: Current Every Day Smoker    Packs/day: 1.00    Types: Cigarettes    Start date: 05/18/1961  . Smokeless tobacco: Never Used  . Tobacco comment: smokes pack a day  Substance and Sexual Activity  . Alcohol use: No  . Drug use: No  . Sexual activity: Not on file  Other Topics Concern  . Not on file  Social History Narrative   Lives with girlfriend for last 8 years.On disability .   Social Determinants of Health   Financial Resource Strain:   . Difficulty of Paying Living Expenses:   Food Insecurity:   . Worried About Programme researcher, broadcasting/film/video in the Last Year:   . Barista in the Last Year:   Transportation Needs:   . Freight forwarder (Medical):   Marland Kitchen Lack of Transportation (Non-Medical):   Physical Activity:   . Days of Exercise per Week:   . Minutes of Exercise per Session:   Stress:   . Feeling of Stress :   Social Connections:   . Frequency of  Communication with Friends and Family:   . Frequency of Social Gatherings with Friends and Family:   . Attends Religious Services:   . Active Member of Clubs or Organizations:   . Attends Banker Meetings:   Marland Kitchen Marital Status:     His Allergies Are:  No Known Allergies:   His Current Medications Are:  Outpatient Encounter Medications as of 11/20/2019  Medication Sig  . aspirin 81 MG tablet Take 1 tablet (81 mg total) by mouth at bedtime.  Marland Kitchen atorvastatin (LIPITOR) 80 MG tablet Take 1 tablet (80 mg total) by mouth  daily.  . carvedilol (COREG) 3.125 MG tablet Take 1 tablet (3.125 mg total) by mouth 2 (two) times daily with a meal.  . clopidogrel (PLAVIX) 75 MG tablet Take 1 tablet (75 mg total) by mouth daily.  . furosemide (LASIX) 40 MG tablet Take 1 tablet (40 mg total) by mouth daily.  Marland Kitchen levothyroxine (SYNTHROID) 100 MCG tablet Take 1 tablet (100 mcg total) by mouth daily before breakfast.  . pantoprazole (PROTONIX) 40 MG tablet TAKE (1) TABLET BY MOUTH AT BEDTIME.  . sacubitril-valsartan (ENTRESTO) 24-26 MG Take 1 tablet by mouth 2 (two) times daily.   No facility-administered encounter medications on file as of 11/20/2019.  :  Review of Systems:  Out of a complete 14 point review of systems, all are reviewed and negative with the exception of these symptoms as listed below:  Review of Systems  Constitutional: Negative.   Eyes: Negative.   Respiratory: Positive for choking, shortness of breath and wheezing.   Cardiovascular: Positive for leg swelling.  Genitourinary: Negative.   Musculoskeletal: Positive for back pain and gait problem.  Skin: Negative.   Allergic/Immunologic: Negative.   Neurological: Positive for weakness and headaches.       Epworth Sleepiness Scale 0= would never doze 1= slight chance of dozing 2= moderate chance of dozing 3= high chance of dozing  Sitting and reading:1 Watching TV:1 Sitting inactive in a public place (ex. Theater or  meeting):0 As a passenger in a car for an hour without a break:0 Lying down to rest in the afternoon:3 Sitting and talking to someone:0 Sitting quietly after lunch (no alcohol):0 In a car, while stopped in traffic:0 Total:5   Psychiatric/Behavioral: Positive for sleep disturbance.       Objective:   Physical Exam    Physical Examination:   Vitals:   11/20/19 1056  BP: (!) 151/94  Pulse: 80  Temp: 97.6 F (36.4 C)    General Examination: The patient is a very pleasant 69 y.o. male in no acute distress. He appears deconditioned, mildly short of breath.  Wheezing noted.  HEENT: Normocephalic, atraumatic, pupils are equal, round and reactive to light, extraocular tracking is good without limitation to gaze excursion or nystagmus noted. Hearing is grossly intact. Face is symmetric with normal facial animation. Speech is clear with no dysarthria noted. There is no hypophonia. There is no lip, neck/head, jaw or voice tremor. Neck is supple with full range of passive and active motion. There are no carotid bruits on auscultation. Oropharynx exam reveals: moderate mouth dryness, edentulous, moderate airway crowding, neck 18.25 in. Tongue protrudes centrally and palate elevates symmetrically.   Chest: Clear to auscultation without wheezing, rhonchi or crackles noted.  Heart: S1+S2+0, regular and normal without murmurs, rubs or gallops noted.   Abdomen: Soft, non-tender and non-distended with normal bowel sounds appreciated on auscultation.  Extremities: There is 2+ pitting edema in the distal lower extremities bilaterally.   Skin: Warm and dry without trophic changes noted.   Musculoskeletal: exam reveals no obvious joint deformities, tenderness or joint swelling or erythema.   Neurologically:  Mental status: The patient is awake, alert and oriented in all 4 spheres. His immediate and remote memory, attention, language skills and fund of knowledge are appropriate. There is no  evidence of aphasia, agnosia, apraxia or anomia. Speech is clear with normal prosody and enunciation. Thought process is linear. Mood is normal and affect is somewhat blunted.  Cranial nerves II - XII are as described above under HEENT exam.  Motor  exam: Normal bulk, strength and tone is noted. There is no tremor. Fine motor skills and coordination: grossly intact.  Cerebellar testing: No dysmetria or intention tremor. There is no truncal or gait ataxia.  Sensory exam: intact to light touch in the upper and lower extremities.  Gait, station and balance: He stands with difficulty. No veering to one side is noted. No leaning to one side is noted. Posture is age-appropriate and stance is narrow based. Gait is slow, somewhat cautious. No problems turning are noted.     Assessment & Plan:  In summary, Ketan Renz is a very pleasant 69 y.o.-year old male with a complex medical history of ischemic cardiomyopathy, heart attack, history of stroke, hypothyroidism, hyperlipidemia, reflux disease, hypertension, chronic systolic CHF, AAA, with s/p repair, vitamin D deficiency, peptic ulcer, arthritis, smoking, COPD, no longer on O2, and obesity, who presents for re-evaluation of his obstructive sleep apnea (OSA). He had a CPAP machine but no longer has it, has an old CPAP machine from his sister, which cannot be adjusted or used. I had a long chat with the patient about my findings and the diagnosis of OSA, its prognosis and treatment options. We talked about medical treatments, surgical interventions and non-pharmacological approaches. I explained in particular the risks and ramifications of untreated moderate to severe OSA, especially with respect to developing cardiovascular disease down the Road, including congestive heart failure, difficult to treat hypertension, cardiac arrhythmias, or stroke. Even type 2 diabetes has, in part, been linked to untreated OSA. Symptoms of untreated OSA include daytime sleepiness,  memory problems, mood irritability and mood disorder such as depression and anxiety, lack of energy, as well as recurrent headaches, especially morning headaches. We talked about smoking cessation and trying to maintain a healthy lifestyle in general, as well as the importance of weight control. We also talked about the importance of good sleep hygiene. He is currently not planning on adjusting his sleep schedule. I recommended the following at this time: sleep study.  I explained the sleep test procedure to the patient and also outlined possible surgical and non-surgical treatment options of OSA. He indicated, that he would be willing to try CPAP again. He is encouraged to talk to you about seeing a pulmonary specialist.  I answered all his questions today and the patient was in agreement. I plan to see him back after the sleep study is completed and encouraged him to call with any interim questions, concerns, problems or updates.   Thank you very much for allowing me to participate in the care of this nice patient. If I can be of any further assistance to you please do not hesitate to call me at [phone].  Sincerely,   Huston Foley, MD, PhD

## 2019-11-20 NOTE — Patient Instructions (Addendum)
Thank you for choosing [facility] for your sleep related care! It was nice to meet you today! I appreciate that you entrust me with your sleep related healthcare concerns. I hope, I was able to address at least some of your concerns today, and that I can help you feel reassured and also get better.    Here is what we discussed today and what we came up with as our plan for you:    Based on your symptoms and your exam I believe you are at risk for obstructive sleep apnea (aka OSA), and I think we should proceed with a sleep study to determine whether you do or do not have OSA and how severe it is. Even, if you have mild OSA, I may want you to consider treatment with CPAP, as treatment of even borderline or mild sleep apnea can result and improvement of symptoms such as sleep disruption, daytime sleepiness, nighttime bathroom breaks, restless leg symptoms, improvement of headache syndromes, even improved mood disorder.   Please remember, the long-term risks and ramifications of untreated moderate to severe obstructive sleep apnea are: increased Cardiovascular disease, including congestive heart failure, stroke, difficult to control hypertension, treatment resistant obesity, arrhythmias, especially irregular heartbeat commonly known as A. Fib. (atrial fibrillation); even type 2 diabetes has been linked to untreated OSA.   Sleep apnea can cause disruption of sleep and sleep deprivation in most cases, which, in turn, can cause recurrent headaches, problems with memory, mood, concentration, focus, and vigilance. Most people with untreated sleep apnea report excessive daytime sleepiness, which can affect their ability to drive. Please do not drive if you feel sleepy. Patients with sleep apnea developed difficulty initiating and maintaining sleep (aka insomnia).   Having sleep apnea may increase your risk for other sleep disorders, including involuntary behaviors sleep such as sleep terrors,  sleep talking, sleepwalking.    Having sleep apnea can also increase your risk for restless leg syndrome and leg movements at night.   Please note that untreated obstructive sleep apnea may carry additional perioperative morbidity. Patients with significant obstructive sleep apnea (typically, in the moderate to severe degree) should receive, if possible, perioperative PAP (positive airway pressure) therapy and the surgeons and particularly the anesthesiologists should be informed of the diagnosis and the severity of the sleep disordered breathing.   I will likely see you back after your sleep study to go over the test results and where to go from there. We will call you after your sleep study to advise about the results (most likely, you will hear from Arispe, my nurse) and to set up an appointment at the time, as necessary.    Our sleep lab administrative assistant will call you to schedule your sleep study and give you further instructions, regarding the check in process for the sleep study, arrival time, what to bring, when you can expect to leave after the study, etc., and to answer any other logistical questions you may have. If you don't hear back from her by about 2 weeks from now, please feel free to call her direct line at [phone] or you can call our general clinic number, or email [place] through My Chart.   Please also talk to your primary care about seeing a lung specialist as you have wheezing and a history of COPD.

## 2019-11-26 ENCOUNTER — Telehealth: Payer: Self-pay | Admitting: Neurology

## 2019-11-26 NOTE — Telephone Encounter (Signed)
To ensure continuity of care, please advise patient to get a referral from his primary care physician for a sleep specialist who can order and read sleep studies at [facility] or any hospital of his choosing and follow-up with him or her henceforward.

## 2019-11-26 NOTE — Telephone Encounter (Signed)
I got a call from [facility] that the pt wants to have his sleep study at [facility]. She asked if Dr. Frances Furbish could place an order for a split night. I informed her that our doctors don't place orders for other sleep labs. The pt would have to come to our facility. She verbalized understanding. I called the pt and informed him that for him to continue care with Dr. Frances Furbish he would have to come to [facility] sleep lab. Pt stated, "[facility] is [facility] minutes away from him were [facility] is 45 minutes away." I told the pt he can go to [facility] if he wishes but Dr. Frances Furbish will not follow his care. I informed the pt that he will have to follow up with Dr. Gerilyn Pilgrim as he is to read the pt's study. I stressed with the pt that once he goes to [facility] our office can not follow his care. Pt verbalized understanding.

## 2019-11-27 ENCOUNTER — Other Ambulatory Visit (INDEPENDENT_AMBULATORY_CARE_PROVIDER_SITE_OTHER): Payer: Self-pay

## 2019-11-27 ENCOUNTER — Other Ambulatory Visit: Payer: Self-pay | Admitting: Internal Medicine

## 2019-11-27 MED ORDER — ATORVASTATIN CALCIUM 80 MG PO TABS: 80.0000 mg | ORAL_TABLET | Freq: Every day | ORAL | 0 refills | Status: DC

## 2019-11-27 MED ORDER — PANTOPRAZOLE SODIUM 40 MG PO TBEC: DELAYED_RELEASE_TABLET | ORAL | 2 refills | Status: DC

## 2019-11-27 MED ORDER — LEVOTHYROXINE SODIUM 100 MCG PO TABS: 100.0000 ug | ORAL_TABLET | Freq: Every day | ORAL | 0 refills | Status: DC

## 2019-11-27 MED ORDER — CARVEDILOL 3.125 MG PO TABS: 3.1250 mg | ORAL_TABLET | Freq: Two times a day (BID) | ORAL | 0 refills | Status: DC

## 2019-11-27 MED ORDER — CLOPIDOGREL BISULFATE 75 MG PO TABS: 75.0000 mg | ORAL_TABLET | Freq: Every day | ORAL | 0 refills | Status: DC

## 2019-11-27 MED ORDER — SACUBITRIL-VALSARTAN 24-26 MG PO TABS: 1.0000 | ORAL_TABLET | Freq: Two times a day (BID) | ORAL | 0 refills | Status: DC

## 2019-11-27 MED ORDER — FUROSEMIDE 40 MG PO TABS: 40.0000 mg | ORAL_TABLET | Freq: Every day | ORAL | 2 refills | Status: DC

## 2019-12-03 DIAGNOSIS — B070 Plantar wart: Secondary | ICD-10-CM | POA: Diagnosis not present

## 2019-12-05 ENCOUNTER — Other Ambulatory Visit: Payer: Self-pay

## 2019-12-05 ENCOUNTER — Other Ambulatory Visit (HOSPITAL_COMMUNITY)
Admission: RE | Admit: 2019-12-05 | Discharge: 2019-12-05 | Disposition: A | Discharge status: Home / Self Care | Payer: Medicare Other | Source: Ambulatory Visit | Attending: Neurology | Admitting: Neurology

## 2019-12-05 DIAGNOSIS — I110 Hypertensive heart disease with heart failure: Secondary | ICD-10-CM | POA: Diagnosis not present

## 2019-12-05 DIAGNOSIS — R0602 Shortness of breath: Secondary | ICD-10-CM | POA: Diagnosis not present

## 2019-12-06 LAB — SARS CORONAVIRUS 2 (TAT 6-24 HRS): SARS Coronavirus 2: NEGATIVE

## 2019-12-07 ENCOUNTER — Other Ambulatory Visit: Payer: Self-pay

## 2019-12-07 ENCOUNTER — Inpatient Hospital Stay (HOSPITAL_COMMUNITY)
Admission: EM | Admit: 2019-12-07 | Discharge: 2019-12-09 | DRG: 291 | Disposition: A | Discharge status: Home / Self Care | Payer: Medicare Other | Attending: Family Medicine | Admitting: Family Medicine

## 2019-12-07 ENCOUNTER — Encounter (HOSPITAL_COMMUNITY): Payer: Self-pay | Admitting: Emergency Medicine

## 2019-12-07 ENCOUNTER — Ambulatory Visit: Payer: Medicare Other | Admitting: Neurology

## 2019-12-07 ENCOUNTER — Emergency Department (HOSPITAL_COMMUNITY): Payer: Medicare Other

## 2019-12-07 DIAGNOSIS — E1169 Type 2 diabetes mellitus with other specified complication: Secondary | ICD-10-CM | POA: Diagnosis present

## 2019-12-07 DIAGNOSIS — G4733 Obstructive sleep apnea (adult) (pediatric): Secondary | ICD-10-CM | POA: Diagnosis present

## 2019-12-07 DIAGNOSIS — Z7982 Long term (current) use of aspirin: Secondary | ICD-10-CM | POA: Diagnosis not present

## 2019-12-07 DIAGNOSIS — E039 Hypothyroidism, unspecified: Secondary | ICD-10-CM | POA: Diagnosis not present

## 2019-12-07 DIAGNOSIS — E782 Mixed hyperlipidemia: Secondary | ICD-10-CM | POA: Diagnosis present

## 2019-12-07 DIAGNOSIS — I69354 Hemiplegia and hemiparesis following cerebral infarction affecting left non-dominant side: Secondary | ICD-10-CM

## 2019-12-07 DIAGNOSIS — I10 Essential (primary) hypertension: Secondary | ICD-10-CM | POA: Diagnosis present

## 2019-12-07 DIAGNOSIS — I160 Hypertensive urgency: Secondary | ICD-10-CM | POA: Diagnosis present

## 2019-12-07 DIAGNOSIS — J9601 Acute respiratory failure with hypoxia: Secondary | ICD-10-CM | POA: Diagnosis present

## 2019-12-07 DIAGNOSIS — E785 Hyperlipidemia, unspecified: Secondary | ICD-10-CM | POA: Diagnosis present

## 2019-12-07 DIAGNOSIS — Z79899 Other long term (current) drug therapy: Secondary | ICD-10-CM

## 2019-12-07 DIAGNOSIS — Z823 Family history of stroke: Secondary | ICD-10-CM

## 2019-12-07 DIAGNOSIS — Z20822 Contact with and (suspected) exposure to covid-19: Secondary | ICD-10-CM | POA: Diagnosis not present

## 2019-12-07 DIAGNOSIS — R0602 Shortness of breath: Secondary | ICD-10-CM | POA: Diagnosis not present

## 2019-12-07 DIAGNOSIS — I5043 Acute on chronic combined systolic (congestive) and diastolic (congestive) heart failure: Secondary | ICD-10-CM | POA: Diagnosis not present

## 2019-12-07 DIAGNOSIS — Z8711 Personal history of peptic ulcer disease: Secondary | ICD-10-CM

## 2019-12-07 DIAGNOSIS — Z9889 Other specified postprocedural states: Secondary | ICD-10-CM | POA: Diagnosis not present

## 2019-12-07 DIAGNOSIS — I509 Heart failure, unspecified: Secondary | ICD-10-CM | POA: Diagnosis not present

## 2019-12-07 DIAGNOSIS — Z8249 Family history of ischemic heart disease and other diseases of the circulatory system: Secondary | ICD-10-CM | POA: Diagnosis not present

## 2019-12-07 DIAGNOSIS — R0902 Hypoxemia: Secondary | ICD-10-CM | POA: Diagnosis not present

## 2019-12-07 DIAGNOSIS — I2510 Atherosclerotic heart disease of native coronary artery without angina pectoris: Secondary | ICD-10-CM | POA: Diagnosis not present

## 2019-12-07 DIAGNOSIS — Z955 Presence of coronary angioplasty implant and graft: Secondary | ICD-10-CM

## 2019-12-07 DIAGNOSIS — I110 Hypertensive heart disease with heart failure: Secondary | ICD-10-CM | POA: Diagnosis not present

## 2019-12-07 DIAGNOSIS — Z7902 Long term (current) use of antithrombotics/antiplatelets: Secondary | ICD-10-CM

## 2019-12-07 DIAGNOSIS — K219 Gastro-esophageal reflux disease without esophagitis: Secondary | ICD-10-CM | POA: Diagnosis not present

## 2019-12-07 DIAGNOSIS — J449 Chronic obstructive pulmonary disease, unspecified: Secondary | ICD-10-CM | POA: Diagnosis present

## 2019-12-07 DIAGNOSIS — Z96641 Presence of right artificial hip joint: Secondary | ICD-10-CM | POA: Diagnosis present

## 2019-12-07 DIAGNOSIS — I5033 Acute on chronic diastolic (congestive) heart failure: Secondary | ICD-10-CM | POA: Diagnosis present

## 2019-12-07 DIAGNOSIS — I252 Old myocardial infarction: Secondary | ICD-10-CM | POA: Diagnosis not present

## 2019-12-07 DIAGNOSIS — R778 Other specified abnormalities of plasma proteins: Secondary | ICD-10-CM

## 2019-12-07 DIAGNOSIS — R079 Chest pain, unspecified: Secondary | ICD-10-CM | POA: Diagnosis not present

## 2019-12-07 DIAGNOSIS — F17210 Nicotine dependence, cigarettes, uncomplicated: Secondary | ICD-10-CM | POA: Diagnosis present

## 2019-12-07 DIAGNOSIS — R451 Restlessness and agitation: Secondary | ICD-10-CM | POA: Diagnosis not present

## 2019-12-07 DIAGNOSIS — Z6834 Body mass index (BMI) 34.0-34.9, adult: Secondary | ICD-10-CM

## 2019-12-07 DIAGNOSIS — I25118 Atherosclerotic heart disease of native coronary artery with other forms of angina pectoris: Secondary | ICD-10-CM | POA: Diagnosis not present

## 2019-12-07 DIAGNOSIS — R7989 Other specified abnormal findings of blood chemistry: Secondary | ICD-10-CM | POA: Diagnosis not present

## 2019-12-07 DIAGNOSIS — Z79890 Hormone replacement therapy (postmenopausal): Secondary | ICD-10-CM

## 2019-12-07 DIAGNOSIS — R0600 Dyspnea, unspecified: Secondary | ICD-10-CM

## 2019-12-07 LAB — BASIC METABOLIC PANEL
Anion gap: 9 (ref 5–15)
BUN: 15 mg/dL (ref 8–23)
CO2: 28 mmol/L (ref 22–32)
Calcium: 9.1 mg/dL (ref 8.9–10.3)
Chloride: 100 mmol/L (ref 98–111)
Creatinine, Ser: 1 mg/dL (ref 0.61–1.24)
GFR calc Af Amer: >60 mL/min (ref >60)
GFR calc non Af Amer: >60 mL/min (ref >60)
Glucose, Bld: 109 mg/dL — ABNORMAL HIGH (ref 70–99)
Potassium: 3.7 mmol/L (ref 3.5–5.1)
Sodium: 137 mmol/L (ref 135–145)

## 2019-12-07 LAB — CBC
HCT: 50.2 % (ref 39.0–52.0)
Hemoglobin: 16.2 g/dL (ref 13.0–17.0)
MCH: 31.2 pg (ref 26.0–34.0)
MCHC: 32.3 g/dL (ref 30.0–36.0)
MCV: 96.7 fL (ref 80.0–100.0)
Platelets: 190 10*3/uL (ref 150–400)
RBC: 5.19 MIL/uL (ref 4.22–5.81)
RDW: 14.6 % (ref 11.5–15.5)
WBC: 14.8 10*3/uL — ABNORMAL HIGH (ref 4.0–10.5)
nRBC: 0 % (ref 0.0–0.2)

## 2019-12-07 LAB — TROPONIN I (HIGH SENSITIVITY): Troponin I (High Sensitivity): 37 ng/L — ABNORMAL HIGH (ref <18)

## 2019-12-07 MED ORDER — NITROGLYCERIN IN D5W 200-5 MCG/ML-% IV SOLN
5.0000 ug/min | INTRAVENOUS | Status: DC
  Administered 2019-12-08: 90 ug/min via INTRAVENOUS
  Administered 2019-12-08: 130 ug/min via INTRAVENOUS
  Filled 2019-12-07 (×3): qty 250

## 2019-12-07 MED ORDER — NITROGLYCERIN IN D5W 200-5 MCG/ML-% IV SOLN
INTRAVENOUS | Status: AC
  Administered 2019-12-07: 40 ug/min via INTRAVENOUS
  Filled 2019-12-07: qty 250

## 2019-12-07 NOTE — ED Triage Notes (Signed)
Pt arrives from sleep study lab. Pt C/O SOB and CP that started 10 minutes ago. Pt 86% on RA.

## 2019-12-07 NOTE — ED Provider Notes (Signed)
[facility] EMERGENCY DEPARTMENT Provider Note   CSN: [id number] Arrival date & time: 12/07/19  2300     History Chief Complaint  Patient presents with  . Chest Pain  . Shortness of Breath    Bruce Barrera is a 69 y.o. male.  Patient with history of congestive heart failure, coronary artery disease, heart attack, cardiomyopathy, peptic ulcer, sleep apnea presents from sleep apnea study with significant shortness of breath and hypoxia low 80s.  Patient was just getting set up no medications given and he started feeling extremely short of breath.  Patient denies recent fever.  Patient's had mild cough.  Patient unsure of weight gain or worsening leg edema.  Difficulty getting details from patient due to extremis.        Past Medical History:  Diagnosis Date  . AAA (abdominal aortic aneurysm) (HCC)   . Arthritis    Hips and knees  . Back pain   . Chronic systolic CHF (congestive heart failure) (HCC)   . Coronary artery disease    a. 03/2016 subacute anterior MI -> 100% prox LAD >72 hours out thus treated medically, LVEF 30-35% initially  . Essential hypertension   . GERD (gastroesophageal reflux disease)   . Hemorrhoids   . History of stroke    States "stroke" with left sided weakness 5/95 in prison in [place]  . Hyperlipidemia   . Hypothyroidism   . Ischemic cardiomyopathy   . MI (myocardial infarction) (HCC)    States "heart attack" 2/95 in prison in [place]  . Obesity   . Peptic ulcer    GIB 1994  . Sleep apnea   . Vitamin D deficiency disease 06/19/2019    Patient Active Problem List   Diagnosis Date Noted  . Acute on chronic heart failure with preserved ejection fraction (HFpEF) (HCC) 10/09/2019  . Acute respiratory failure with hypoxia (HCC) 10/08/2019  . Ruptured abdominal aortic aneurysm (HCC) 09/09/2019  . Ruptured abdominal aortic aneurysm (AAA) (HCC) 09/09/2019  . Hypothyroidism, adult 06/19/2019  . Vitamin D deficiency disease 06/19/2019  . Diplopia 04/07/2018      Class: Chronic  . Strabismus 04/07/2018    Class: Chronic  . Obstructive sleep apnea 11/21/2017  . Non-acute transmural anterior myocardial infarction within last eight weeks 03/15/2016  . Pain in the chest   . Chest pain 03/13/2016  . COPD exacerbation (HCC) 03/13/2016  . Exertional angina (HCC) 12/06/2010  . Coronary atherosclerosis of native coronary artery 12/06/2010  . Essential hypertension, benign 12/06/2010  . Mixed hyperlipidemia 12/06/2010  . Tobacco use disorder 12/06/2010  . Morbid obesity (HCC) 12/06/2010    Past Surgical History:  Procedure Laterality Date  . ABDOMINAL AORTIC ENDOVASCULAR STENT GRAFT N/A 09/09/2019   Procedure: ABDOMINAL AORTIC ENDOVASCULAR STENT GRAFT;  Surgeon: Maeola Harman, MD;  Location: [facility] OR;  Service: Vascular;  Laterality: N/A;  . ADJUSTABLE SUTURE MANIPULATION Left 04/10/2018   Procedure: ADJUSTABLE SUTURE MANIPULATION;  Surgeon: Aura Camps, MD;  Location: [facility] OR;  Service: Ophthalmology;  Laterality: Left;  . CARDIAC CATHETERIZATION N/A 03/15/2016   Procedure: Right/Left Heart Cath and Coronary Angiography;  Surgeon: Iran Ouch, MD;  [facility]: [facility] INVASIVE CV LAB;  Service: Cardiovascular;  Laterality: N/A;  . COLONOSCOPY  03/23/2011   hemorrhoids  . KNEE ARTHROSCOPY     Right knee x 2  . MEDIAN RECTUS REPAIR Left 04/10/2018   Procedure: LEFT MEDIAN RECTUS REPAIR;  Surgeon: Aura Camps, MD;  Location: [facility] OR;  Service: Ophthalmology;  Laterality: Left;  .  MUSCLE RECESSION AND RESECTION Left 04/10/2018   Procedure: LEFT MUSCLE RECESSION;  Surgeon: Aura Camps, MD;  Location: [facility] OR;  Service: Ophthalmology;  Laterality: Left;  . TOTAL HIP ARTHROPLASTY Right 2014       Family History  Problem Relation Age of Onset  . Hypertension Mother   . Diabetes Mother   . Heart disease Mother   . Stroke Mother   . Stroke Father   . Heart attack Brother   . Aneurysm Brother   . Cirrhosis Brother     Social History    Tobacco Use  . Smoking status: Current Every Day Smoker    Packs/day: 1.00    Types: Cigarettes    Start date: 05/18/1961  . Smokeless tobacco: Never Used  . Tobacco comment: smokes pack a day  Substance Use Topics  . Alcohol use: No  . Drug use: No    Home Medications Prior to Admission medications   Medication Sig Start Date End Date Taking? Authorizing Provider  aspirin 81 MG tablet Take 1 tablet (81 mg total) by mouth at bedtime. 10/10/19   Verdene Lennert, MD  atorvastatin (LIPITOR) 80 MG tablet Take 1 tablet (80 mg total) by mouth daily. 11/27/19   Wilson Singer, MD  carvedilol (COREG) 3.125 MG tablet Take 1 tablet (3.125 mg total) by mouth 2 (two) times daily with a meal. 11/27/19   Karilyn Cota, Nimish C, MD  clopidogrel (PLAVIX) 75 MG tablet Take 1 tablet (75 mg total) by mouth daily. 11/27/19   Wilson Singer, MD  furosemide (LASIX) 40 MG tablet Take 1 tablet (40 mg total) by mouth daily. 11/27/19   Wilson Singer, MD  levothyroxine (SYNTHROID) 100 MCG tablet Take 1 tablet (100 mcg total) by mouth daily before breakfast. 11/27/19   Gosrani, Nimish C, MD  pantoprazole (PROTONIX) 40 MG tablet TAKE (1) TABLET BY MOUTH AT BEDTIME. 11/27/19   Gosrani, Nimish C, MD  sacubitril-valsartan (ENTRESTO) 24-26 MG Take 1 tablet by mouth 2 (two) times daily. 11/27/19   Wilson Singer, MD    Allergies    Patient has no known allergies.  Review of Systems   Review of Systems  Unable to perform ROS: Acuity of condition    Physical Exam Updated Vital Signs BP (!) 152/111   Pulse (!) 110   Temp 97.7 F (36.5 C) (Axillary)   Resp (!) 28   SpO2 94%   Physical Exam Vitals and nursing note reviewed.  Constitutional:      Appearance: He is well-developed. He is ill-appearing.  HENT:     Head: Normocephalic and atraumatic.  Eyes:     General:        Right eye: No discharge.        Left eye: No discharge.     Conjunctiva/sclera: Conjunctivae normal.  Neck:     Trachea: No  tracheal deviation.  Cardiovascular:     Rate and Rhythm: Regular rhythm. Tachycardia present.  Pulmonary:     Effort: Tachypnea, accessory muscle usage and respiratory distress present.     Breath sounds: Examination of the right-lower field reveals rhonchi. Examination of the left-lower field reveals rhonchi. Decreased breath sounds and rhonchi present.  Abdominal:     General: There is no distension.     Palpations: Abdomen is soft.     Tenderness: There is no abdominal tenderness. There is no guarding.  Musculoskeletal:     Cervical back: Normal range of motion and neck supple.  Right lower leg: Edema present.     Left lower leg: Edema present.  Skin:    General: Skin is warm.     Findings: No rash.  Neurological:     General: No focal deficit present.     Mental Status: He is alert.  Psychiatric:        Behavior: Behavior is agitated.     ED Results / Procedures / Treatments   Labs (all labs ordered are listed, but only abnormal results are displayed) Labs Reviewed  BASIC METABOLIC PANEL - Abnormal; Notable for the following components:      Result Value   Glucose, Bld 109 (*)    All other components within normal limits  CBC - Abnormal; Notable for the following components:   WBC 14.8 (*)    All other components within normal limits  BRAIN NATRIURETIC PEPTIDE - Abnormal; Notable for the following components:   B Natriuretic Peptide 994.0 (*)    All other components within normal limits  HEPATIC FUNCTION PANEL - Abnormal; Notable for the following components:   Alkaline Phosphatase 140 (*)    All other components within normal limits  TROPONIN I (HIGH SENSITIVITY) - Abnormal; Notable for the following components:   Troponin I (High Sensitivity) 37 (*)    All other components within normal limits  RESPIRATORY PANEL BY RT PCR (FLU A&B, COVID)  LACTIC ACID, PLASMA  LACTIC ACID, PLASMA  TROPONIN I (HIGH SENSITIVITY)    EKG EKG  Interpretation  Date/Time:  Sunday December 07 2019 23:12:59 EDT Ventricular Rate:  132 PR Interval:    QRS Duration: 95 QT Interval:  302 QTC Calculation: 448 R Axis:   -48 Text Interpretation: Sinus tachycardia Atrial premature complexes LAE, consider biatrial enlargement Left anterior fascicular block Anterior infarct, old Nonspecific T abnormalities, lateral leads Confirmed by Blane Ohara [phone]) on 12/07/2019 11:52:51 PM   Radiology DG Chest Port 1 View  Result Date: 12/07/2019 CLINICAL DATA:  Chest pain and shortness of breath EXAM: PORTABLE CHEST 1 VIEW COMPARISON:  10/08/2019 FINDINGS: Cardiac shadow is stable. Lungs are again well aerated. Diffuse interstitial markings are noted increased from the prior exam with evidence of interstitial edema. No focal confluent infiltrate or sizable effusion is seen. IMPRESSION: Vascular congestion and edema consistent with CHF. This is increased from the prior exam. Electronically Signed   By: Alcide Clever M.D.   On: 12/07/2019 23:31    Procedures .Critical Care Performed by: Blane Ohara, MD Authorized by: Blane Ohara, MD   Critical care provider statement:    Critical care time (minutes):  75   Critical care start time:  12/07/2019 11:05 PM   Critical care end time:  12/08/2019 12:20 PM   Critical care time was exclusive of:  Separately billable procedures and treating other patients and teaching time   Critical care was necessary to treat or prevent imminent or life-threatening deterioration of the following conditions:  Respiratory failure   Critical care was time spent personally by me on the following activities:  Evaluation of patient's response to treatment, examination of patient, ordering and performing treatments and interventions, ordering and review of laboratory studies, ordering and review of radiographic studies, pulse oximetry, re-evaluation of patient's condition and review of old charts Ultrasound ED Echo  Date/Time:  12/07/2019 11:51 PM Performed by: Blane Ohara, MD Authorized by: Blane Ohara, MD   Procedure details:    Indications: dyspnea     Views: parasternal long axis view and apical 4 chamber view  Images: archived     Limitations:  Body habitus and positioning Findings:    Pericardium: no pericardial effusion     Cardiac Activity: normal cardiac activity     LV Function: depressed (30 - 50%)   Impression:    Impression: abnormal cardiac activity and probable elevated CVP   Ultrasound ED Thoracic  Date/Time: 12/07/2019 11:51 PM Performed by: Blane OharaZavitz, Danta Baumgardner, MD Authorized by: Blane OharaZavitz, Bailley Guilford, MD   Procedure details:    Indications: dyspnea     Assessment for:  Interstitial syndrome   Left lung pleural:  Visualized   Right lung pleural:  Visualized   Images: archived     Limitations:  Body habitus Findings:    B-lines noted throughout: identified   Impression:    Impression: interstitial syndrome     (including critical care time)  Medications Ordered in ED Medications  nitroGLYCERIN 50 mg in dextrose 5 % 250 mL (0.2 mg/mL) infusion (100 mcg/min Intravenous Rate/Dose Change 12/08/19 0035)  aspirin chewable tablet 324 mg (has no administration in time range)  furosemide (LASIX) injection 60 mg (has no administration in time range)  levalbuterol (XOPENEX) 0.63 MG/3ML nebulizer solution (0.63 mg  Given 12/08/19 0038)  ipratropium (ATROVENT) 0.02 % nebulizer solution (0.5 mg  Given 12/08/19 0038)    ED Course  I have reviewed the triage vital signs and the nursing notes.  Pertinent labs & imaging results that were available during my care of the patient were reviewed by me and considered in my medical decision making (see chart for details).    MDM Rules/Calculators/A&P                      Patient with significant medical history including congestive heart failure presents with extreme dyspnea and mild agitation likely due to hypoxia and difficulty breathing.  Patient  sitting up leaning forward does not want to lie back.  Nonrebreather placed initially had improved oxygen saturations to upper 80s however patient still significant respiratory difficulty.  Bedside ultrasound performed showing B-lines diffuse and decreased ejection fraction.  BiPAP ordered and discussed with respiratory therapy at bedside.  Patient improved significantly with BiPAP.  Nitro drip ordered and discussed with nursing to titrate as needed.  Blood work pending.  EKG showed sinus tachycardia.  Covid test pending.  Blood work reviewed white blood cell count 14.8 no obvious source of infection at this time will hold antibiotics.  Chest x-ray consistent with pulmonary edema in addition to bedside ultrasound and clinical exam.  Patient had multiple reassessments and improved with BiPAP and nitro drip increased to 100 mcg/min.  Plan to continue to titrate as needed for symptoms and blood pressure.  IV Lasix 60 mg ordered to assist with removing fluid. Paged hospitalist for admission to stepdown.   Guadelupe SabinJohn Draeger was evaluated in Emergency Department on 12/08/2019 for the symptoms described in the history of present illness. He was evaluated in the context of the global COVID-19 pandemic, which necessitated consideration that the patient might be at risk for infection with the SARS-CoV-2 virus that causes COVID-19. Institutional protocols and algorithms that pertain to the evaluation of patients at risk for COVID-19 are in a state of rapid change based on information released by regulatory bodies including the CDC and federal and state organizations. These policies and algorithms were followed during the patient's care in the ED.  Final Clinical Impression(s) / ED Diagnoses Final diagnoses:  Acute congestive heart failure, unspecified heart failure type (HCC)  Hypoxia  Troponin level elevated    Rx / DC Orders ED Discharge Orders    None       Elnora Morrison, MD 12/08/19 [phone]

## 2019-12-08 DIAGNOSIS — Z7902 Long term (current) use of antithrombotics/antiplatelets: Secondary | ICD-10-CM | POA: Diagnosis not present

## 2019-12-08 DIAGNOSIS — Z20822 Contact with and (suspected) exposure to covid-19: Secondary | ICD-10-CM | POA: Diagnosis present

## 2019-12-08 DIAGNOSIS — E785 Hyperlipidemia, unspecified: Secondary | ICD-10-CM

## 2019-12-08 DIAGNOSIS — F17210 Nicotine dependence, cigarettes, uncomplicated: Secondary | ICD-10-CM | POA: Diagnosis present

## 2019-12-08 DIAGNOSIS — Z8249 Family history of ischemic heart disease and other diseases of the circulatory system: Secondary | ICD-10-CM | POA: Diagnosis not present

## 2019-12-08 DIAGNOSIS — I25118 Atherosclerotic heart disease of native coronary artery with other forms of angina pectoris: Secondary | ICD-10-CM | POA: Diagnosis not present

## 2019-12-08 DIAGNOSIS — I2510 Atherosclerotic heart disease of native coronary artery without angina pectoris: Secondary | ICD-10-CM

## 2019-12-08 DIAGNOSIS — I110 Hypertensive heart disease with heart failure: Secondary | ICD-10-CM | POA: Diagnosis present

## 2019-12-08 DIAGNOSIS — I69354 Hemiplegia and hemiparesis following cerebral infarction affecting left non-dominant side: Secondary | ICD-10-CM | POA: Diagnosis not present

## 2019-12-08 DIAGNOSIS — Z8711 Personal history of peptic ulcer disease: Secondary | ICD-10-CM | POA: Diagnosis not present

## 2019-12-08 DIAGNOSIS — R0602 Shortness of breath: Secondary | ICD-10-CM | POA: Diagnosis not present

## 2019-12-08 DIAGNOSIS — I160 Hypertensive urgency: Secondary | ICD-10-CM | POA: Diagnosis not present

## 2019-12-08 DIAGNOSIS — Z79899 Other long term (current) drug therapy: Secondary | ICD-10-CM | POA: Diagnosis not present

## 2019-12-08 DIAGNOSIS — E039 Hypothyroidism, unspecified: Secondary | ICD-10-CM | POA: Diagnosis present

## 2019-12-08 DIAGNOSIS — I509 Heart failure, unspecified: Secondary | ICD-10-CM

## 2019-12-08 DIAGNOSIS — J449 Chronic obstructive pulmonary disease, unspecified: Secondary | ICD-10-CM | POA: Diagnosis present

## 2019-12-08 DIAGNOSIS — K219 Gastro-esophageal reflux disease without esophagitis: Secondary | ICD-10-CM | POA: Diagnosis present

## 2019-12-08 DIAGNOSIS — I5043 Acute on chronic combined systolic (congestive) and diastolic (congestive) heart failure: Secondary | ICD-10-CM | POA: Diagnosis present

## 2019-12-08 DIAGNOSIS — G4733 Obstructive sleep apnea (adult) (pediatric): Secondary | ICD-10-CM | POA: Diagnosis not present

## 2019-12-08 DIAGNOSIS — Z96641 Presence of right artificial hip joint: Secondary | ICD-10-CM | POA: Diagnosis present

## 2019-12-08 DIAGNOSIS — J9601 Acute respiratory failure with hypoxia: Secondary | ICD-10-CM | POA: Diagnosis present

## 2019-12-08 DIAGNOSIS — Z823 Family history of stroke: Secondary | ICD-10-CM | POA: Diagnosis not present

## 2019-12-08 DIAGNOSIS — Z7982 Long term (current) use of aspirin: Secondary | ICD-10-CM | POA: Diagnosis not present

## 2019-12-08 DIAGNOSIS — I252 Old myocardial infarction: Secondary | ICD-10-CM | POA: Diagnosis not present

## 2019-12-08 DIAGNOSIS — I5033 Acute on chronic diastolic (congestive) heart failure: Secondary | ICD-10-CM

## 2019-12-08 DIAGNOSIS — R451 Restlessness and agitation: Secondary | ICD-10-CM | POA: Diagnosis present

## 2019-12-08 DIAGNOSIS — E782 Mixed hyperlipidemia: Secondary | ICD-10-CM | POA: Diagnosis present

## 2019-12-08 DIAGNOSIS — Z79890 Hormone replacement therapy (postmenopausal): Secondary | ICD-10-CM | POA: Diagnosis not present

## 2019-12-08 LAB — HEPATIC FUNCTION PANEL
ALT: 17 U/L (ref 0–44)
AST: 18 U/L (ref 15–41)
Albumin: 3.9 g/dL (ref 3.5–5.0)
Alkaline Phosphatase: 140 U/L — ABNORMAL HIGH (ref 38–126)
Bilirubin, Direct: 0.1 mg/dL (ref 0.0–0.2)
Indirect Bilirubin: 0.5 mg/dL (ref 0.3–0.9)
Total Bilirubin: 0.6 mg/dL (ref 0.3–1.2)
Total Protein: 7.6 g/dL (ref 6.5–8.1)

## 2019-12-08 LAB — BASIC METABOLIC PANEL
Anion gap: 9 (ref 5–15)
BUN: 15 mg/dL (ref 8–23)
CO2: 26 mmol/L (ref 22–32)
Calcium: 8.5 mg/dL — ABNORMAL LOW (ref 8.9–10.3)
Chloride: 100 mmol/L (ref 98–111)
Creatinine, Ser: 1.01 mg/dL (ref 0.61–1.24)
GFR calc Af Amer: >60 mL/min (ref >60)
GFR calc non Af Amer: >60 mL/min (ref >60)
Glucose, Bld: 162 mg/dL — ABNORMAL HIGH (ref 70–99)
Potassium: 3 mmol/L — ABNORMAL LOW (ref 3.5–5.1)
Sodium: 135 mmol/L (ref 135–145)

## 2019-12-08 LAB — TROPONIN I (HIGH SENSITIVITY)
Troponin I (High Sensitivity): 58 ng/L — ABNORMAL HIGH (ref <18)
Troponin I (High Sensitivity): 72 ng/L — ABNORMAL HIGH (ref <18)
Troponin I (High Sensitivity): 65 ng/L — ABNORMAL HIGH (ref <18)
Troponin I (High Sensitivity): 46 ng/L — ABNORMAL HIGH (ref <18)

## 2019-12-08 LAB — RESPIRATORY PANEL BY RT PCR (FLU A&B, COVID)
Influenza A by PCR: NEGATIVE
Influenza B by PCR: NEGATIVE
SARS Coronavirus 2 by RT PCR: NEGATIVE

## 2019-12-08 LAB — CBC
HCT: 46.4 % (ref 39.0–52.0)
Hemoglobin: 15 g/dL (ref 13.0–17.0)
MCH: 31.6 pg (ref 26.0–34.0)
MCHC: 32.3 g/dL (ref 30.0–36.0)
MCV: 97.7 fL (ref 80.0–100.0)
Platelets: 156 10*3/uL (ref 150–400)
RBC: 4.75 MIL/uL (ref 4.22–5.81)
RDW: 14.9 % (ref 11.5–15.5)
WBC: 15.3 10*3/uL — ABNORMAL HIGH (ref 4.0–10.5)
nRBC: 0 % (ref 0.0–0.2)

## 2019-12-08 LAB — TSH: TSH: 3.816 u[IU]/mL (ref 0.350–4.500)

## 2019-12-08 LAB — LACTIC ACID, PLASMA
Lactic Acid, Venous: 0.9 mmol/L (ref 0.5–1.9)
Lactic Acid, Venous: 0.9 mmol/L (ref 0.5–1.9)

## 2019-12-08 LAB — BRAIN NATRIURETIC PEPTIDE: B Natriuretic Peptide: 994 pg/mL — ABNORMAL HIGH (ref 0.0–100.0)

## 2019-12-08 LAB — MAGNESIUM: Magnesium: 2 mg/dL (ref 1.7–2.4)

## 2019-12-08 LAB — MRSA PCR SCREENING: MRSA by PCR: NEGATIVE

## 2019-12-08 MED ORDER — ATORVASTATIN CALCIUM 40 MG PO TABS
80.0000 mg | ORAL_TABLET | Freq: Every day | ORAL | Status: DC
  Administered 2019-12-08 – 2019-12-09 (×2): 80 mg via ORAL
  Filled 2019-12-08 (×2): qty 2

## 2019-12-08 MED ORDER — ONDANSETRON HCL 4 MG/2ML IJ SOLN: 4.0000 mg | Freq: Four times a day (QID) | INTRAMUSCULAR | Status: DC | PRN

## 2019-12-08 MED ORDER — SODIUM CHLORIDE 0.9 % IV SOLN: 250.0000 mL | INTRAVENOUS | Status: DC | PRN

## 2019-12-08 MED ORDER — SODIUM CHLORIDE 0.9% FLUSH
3.0000 mL | Freq: Two times a day (BID) | INTRAVENOUS | Status: DC
  Administered 2019-12-08 – 2019-12-09 (×3): 3 mL via INTRAVENOUS

## 2019-12-08 MED ORDER — ASPIRIN 81 MG PO CHEW
324.0000 mg | CHEWABLE_TABLET | Freq: Once | ORAL | Status: AC
  Administered 2019-12-08: 324 mg via ORAL
  Filled 2019-12-08: qty 4

## 2019-12-08 MED ORDER — ASPIRIN 300 MG RE SUPP: 300.0000 mg | Freq: Once | RECTAL | Status: DC

## 2019-12-08 MED ORDER — BISACODYL 10 MG RE SUPP: 10.0000 mg | Freq: Every day | RECTAL | Status: DC | PRN

## 2019-12-08 MED ORDER — CLOPIDOGREL BISULFATE 75 MG PO TABS
75.0000 mg | ORAL_TABLET | Freq: Every day | ORAL | Status: DC
  Administered 2019-12-08 – 2019-12-09 (×2): 75 mg via ORAL
  Filled 2019-12-08 (×2): qty 1

## 2019-12-08 MED ORDER — SACUBITRIL-VALSARTAN 24-26 MG PO TABS
1.0000 | ORAL_TABLET | Freq: Two times a day (BID) | ORAL | Status: DC
  Administered 2019-12-08 – 2019-12-09 (×3): 1 via ORAL
  Filled 2019-12-08 (×4): qty 1

## 2019-12-08 MED ORDER — IPRATROPIUM BROMIDE 0.02 % IN SOLN
RESPIRATORY_TRACT | Status: AC
  Administered 2019-12-08: 0.5 mg
  Filled 2019-12-08: qty 2.5

## 2019-12-08 MED ORDER — CHLORHEXIDINE GLUCONATE CLOTH 2 % EX PADS
6.0000 | MEDICATED_PAD | Freq: Every day | CUTANEOUS | Status: DC
  Administered 2019-12-08 – 2019-12-09 (×2): 6 via TOPICAL

## 2019-12-08 MED ORDER — TRAMADOL HCL 50 MG PO TABS
50.0000 mg | ORAL_TABLET | Freq: Three times a day (TID) | ORAL | Status: DC | PRN
  Administered 2019-12-08: 50 mg via ORAL
  Filled 2019-12-08: qty 1

## 2019-12-08 MED ORDER — FUROSEMIDE 10 MG/ML IJ SOLN
60.0000 mg | Freq: Two times a day (BID) | INTRAMUSCULAR | Status: DC
  Administered 2019-12-08 – 2019-12-09 (×4): 60 mg via INTRAVENOUS
  Filled 2019-12-08 (×5): qty 6

## 2019-12-08 MED ORDER — FUROSEMIDE 10 MG/ML IJ SOLN
60.0000 mg | Freq: Once | INTRAMUSCULAR | Status: AC
  Administered 2019-12-08: 60 mg via INTRAVENOUS
  Filled 2019-12-08: qty 6

## 2019-12-08 MED ORDER — POLYETHYLENE GLYCOL 3350 17 G PO PACK: 17.0000 g | PACK | Freq: Every day | ORAL | Status: DC | PRN

## 2019-12-08 MED ORDER — LEVALBUTEROL HCL 0.63 MG/3ML IN NEBU
INHALATION_SOLUTION | RESPIRATORY_TRACT | Status: AC
  Administered 2019-12-08: 0.63 mg
  Filled 2019-12-08: qty 3

## 2019-12-08 MED ORDER — ASPIRIN 81 MG PO CHEW
81.0000 mg | CHEWABLE_TABLET | Freq: Every day | ORAL | Status: DC
  Administered 2019-12-08: 81 mg via ORAL
  Filled 2019-12-08: qty 1

## 2019-12-08 MED ORDER — PANTOPRAZOLE SODIUM 40 MG PO TBEC
40.0000 mg | DELAYED_RELEASE_TABLET | Freq: Every day | ORAL | Status: DC
  Administered 2019-12-08 – 2019-12-09 (×2): 40 mg via ORAL
  Filled 2019-12-08 (×2): qty 1

## 2019-12-08 MED ORDER — ONDANSETRON HCL 4 MG PO TABS: 4.0000 mg | ORAL_TABLET | Freq: Four times a day (QID) | ORAL | Status: DC | PRN

## 2019-12-08 MED ORDER — ENOXAPARIN SODIUM 40 MG/0.4ML ~~LOC~~ SOLN
40.0000 mg | SUBCUTANEOUS | Status: DC
  Administered 2019-12-08 – 2019-12-09 (×2): 40 mg via SUBCUTANEOUS
  Filled 2019-12-08 (×2): qty 0.4

## 2019-12-08 MED ORDER — POTASSIUM CHLORIDE CRYS ER 20 MEQ PO TBCR
40.0000 meq | EXTENDED_RELEASE_TABLET | ORAL | Status: AC
  Administered 2019-12-08 (×2): 40 meq via ORAL
  Filled 2019-12-08 (×2): qty 2

## 2019-12-08 MED ORDER — LEVOTHYROXINE SODIUM 100 MCG PO TABS
100.0000 ug | ORAL_TABLET | Freq: Every day | ORAL | Status: DC
  Administered 2019-12-08 – 2019-12-09 (×2): 100 ug via ORAL
  Filled 2019-12-08 (×3): qty 1

## 2019-12-08 MED ORDER — SODIUM CHLORIDE 0.9% FLUSH: 3.0000 mL | INTRAVENOUS | Status: DC | PRN

## 2019-12-08 MED ORDER — ACETAMINOPHEN 650 MG RE SUPP: 650.0000 mg | Freq: Four times a day (QID) | RECTAL | Status: DC | PRN

## 2019-12-08 MED ORDER — ACETAMINOPHEN 325 MG PO TABS: 650.0000 mg | ORAL_TABLET | Freq: Four times a day (QID) | ORAL | Status: DC | PRN

## 2019-12-08 MED ORDER — CARVEDILOL 3.125 MG PO TABS
3.1250 mg | ORAL_TABLET | Freq: Two times a day (BID) | ORAL | Status: DC
  Administered 2019-12-08 – 2019-12-09 (×3): 3.125 mg via ORAL
  Filled 2019-12-08 (×4): qty 1

## 2019-12-08 NOTE — Consult Note (Signed)
CARDIOLOGY CONSULT NOTE       Patient ID: Bruce Barrera MRN: [id number] DOB/AGE: [birth year] 69 y.o.  Admit date: 12/07/2019 Referring Physician: Courage  Primary Physician: Doree Albee, MD Primary Cardiologist: Domenic Polite Reason for Consultation: CHF  Principal Problem:   Acute on chronic heart failure with preserved ejection fraction (HFpEF) ([place]) Active Problems:   Essential hypertension, benign   Mixed hyperlipidemia   Morbid obesity ([place])   Hypothyroidism, adult   Acute respiratory failure with hypoxia ([place])   Obstructive sleep apnea   CHF exacerbation (HCC)   HPI:  69 y.o. obese male with history of DCM, CAD with anterior MI 2017 EF 30-35% Rx medically Last echo done 10/09/19 with EF 50-55% no significant valve disease Has not been compliant with meds last 3 days admitted with dyspnea and LE edema no chest pain Worsening LE edema also with HTN on admission requiring iv nitro. He denies any SSCP.  Troponin mildly elevated with not trend 72 with BNP 994 CXR with vascular congestion and CHF. He is currently on bipap and feels better Wants to go on [place]. Dyspnea improved with diuresis ECG with ST no acute ischemic changes   ROS All other systems reviewed and negative except as noted above  Past Medical History:  Diagnosis Date  . AAA (abdominal aortic aneurysm) ([place])   . Arthritis    Hips and knees  . Back pain   . Chronic systolic CHF (congestive heart failure) ([place])   . Coronary artery disease    a. 03/2016 subacute anterior MI -> 100% prox LAD >72 hours out thus treated medically, LVEF 30-35% initially  . Essential hypertension   . GERD (gastroesophageal reflux disease)   . Hemorrhoids   . History of stroke    States "stroke" with left sided weakness 5/95 in prison in [place]  . Hyperlipidemia   . Hypothyroidism   . Ischemic cardiomyopathy   . MI (myocardial infarction) ([place])    States "heart attack" 2/95 in prison in [place]  . Obesity   . Peptic ulcer    GIB 1994  .  Sleep apnea   . Vitamin D deficiency disease 06/19/2019    Family History  Problem Relation Age of Onset  . Hypertension Mother   . Diabetes Mother   . Heart disease Mother   . Stroke Mother   . Stroke Father   . Heart attack Brother   . Aneurysm Brother   . Cirrhosis Brother     Social History   Socioeconomic History  . Marital status: Divorced    Spouse name: Not on file  . Number of children: Not on file  . Years of education: Not on file  . Highest education level: Not on file  Occupational History  . Occupation: Disabled    Comment: [employer]  Tobacco Use  . Smoking status: Current Every Day Smoker    Packs/day: 1.00    Types: Cigarettes    Start date: 05/18/1961  . Smokeless tobacco: Never Used  . Tobacco comment: smokes pack a day  Substance and Sexual Activity  . Alcohol use: No  . Drug use: No  . Sexual activity: Not on file  Other Topics Concern  . Not on file  Social History Narrative   Lives with girlfriend for last 8 years.On disability .   Social Determinants of Health   Financial Resource Strain:   . Difficulty of Paying Living Expenses:   Food Insecurity:   . Worried About Charity fundraiser in  the Last Year:   . Ran Out of Food in the Last Year:   Transportation Needs:   . Freight forwarder (Medical):   Marland Kitchen Lack of Transportation (Non-Medical):   Physical Activity:   . Days of Exercise per Week:   . Minutes of Exercise per Session:   Stress:   . Feeling of Stress :   Social Connections:   . Frequency of Communication with Friends and Family:   . Frequency of Social Gatherings with Friends and Family:   . Attends Religious Services:   . Active Member of Clubs or Organizations:   . Attends Banker Meetings:   Marland Kitchen Marital Status:   Intimate Partner Violence:   . Fear of Current or Ex-Partner:   . Emotionally Abused:   Marland Kitchen Physically Abused:   . Sexually Abused:     Past Surgical History:  Procedure Laterality Date  .  ABDOMINAL AORTIC ENDOVASCULAR STENT GRAFT N/A 09/09/2019   Procedure: ABDOMINAL AORTIC ENDOVASCULAR STENT GRAFT;  Surgeon: Maeola Harman, MD;  Location: [facility] OR;  Service: Vascular;  Laterality: N/A;  . ADJUSTABLE SUTURE MANIPULATION Left 04/10/2018   Procedure: ADJUSTABLE SUTURE MANIPULATION;  Surgeon: Aura Camps, MD;  Location: [facility] OR;  Service: Ophthalmology;  Laterality: Left;  . CARDIAC CATHETERIZATION N/A 03/15/2016   Procedure: Right/Left Heart Cath and Coronary Angiography;  Surgeon: Iran Ouch, MD;  Location: [facility] INVASIVE CV LAB;  Service: Cardiovascular;  Laterality: N/A;  . COLONOSCOPY  03/23/2011   hemorrhoids  . KNEE ARTHROSCOPY     Right knee x 2  . MEDIAN RECTUS REPAIR Left 04/10/2018   Procedure: LEFT MEDIAN RECTUS REPAIR;  Surgeon: Aura Camps, MD;  Location: [facility] OR;  Service: Ophthalmology;  Laterality: Left;  Marland Kitchen MUSCLE RECESSION AND RESECTION Left 04/10/2018   Procedure: LEFT MUSCLE RECESSION;  Surgeon: Aura Camps, MD;  Location: [facility] OR;  Service: Ophthalmology;  Laterality: Left;  . TOTAL HIP ARTHROPLASTY Right 2014      Current Facility-Administered Medications:  .  0.9 %  sodium chloride infusion, 250 mL, Intravenous, PRN, Olga Coaster, MD .  acetaminophen (TYLENOL) tablet 650 mg, 650 mg, Oral, Q6H PRN **OR** acetaminophen (TYLENOL) suppository 650 mg, 650 mg, Rectal, Q6H PRN, Olga Coaster, MD .  aspirin chewable tablet 81 mg, 81 mg, Oral, QHS, Olga Coaster, MD .  atorvastatin (LIPITOR) tablet 80 mg, 80 mg, Oral, Daily, Olga Coaster, MD .  bisacodyl (DULCOLAX) suppository 10 mg, 10 mg, Rectal, Daily PRN, Olga Coaster, MD .  carvedilol (COREG) tablet 3.125 mg, 3.125 mg, Oral, BID WC, Olga Coaster, MD .  clopidogrel (PLAVIX) tablet 75 mg, 75 mg, Oral, Daily, Olga Coaster, MD .  enoxaparin (LOVENOX) injection 40 mg, 40 mg, Subcutaneous, Q24H, Gadhia, Shardul M, MD .  furosemide (LASIX) injection 60 mg, 60 mg,  Intravenous, Q12H, Olga Coaster, MD, 60 mg at 12/08/19 0409 .  levothyroxine (SYNTHROID) tablet 100 mcg, 100 mcg, Oral, Q0600, Olga Coaster, MD .  nitroGLYCERIN 50 mg in dextrose 5 % 250 mL (0.2 mg/mL) infusion, 40-200 mcg/min, Intravenous, Continuous, Olga Coaster, MD, Last Rate: 39 mL/hr at 12/08/19 0729, 130 mcg/min at 12/08/19 0729 .  ondansetron (ZOFRAN) tablet 4 mg, 4 mg, Oral, Q6H PRN **OR** ondansetron (ZOFRAN) injection 4 mg, 4 mg, Intravenous, Q6H PRN, Olga Coaster, MD .  pantoprazole (PROTONIX) EC tablet 40 mg, 40 mg, Oral, Daily, Teodoro Kil, Annice Pih, MD .  polyethylene glycol (MIRALAX / GLYCOLAX) packet 17  g, 17 g, Oral, Daily PRN, Olga Coaster, MD .  potassium chloride SA (KLOR-CON) CR tablet 40 mEq, 40 mEq, Oral, Q3H, Emokpae, Courage, MD .  sacubitril-valsartan (ENTRESTO) 24-26 mg per tablet, 1 tablet, Oral, BID, Teodoro Kil, Shardul M, MD .  sodium chloride flush (NS) 0.9 % injection 3 mL, 3 mL, Intravenous, Q12H, Teodoro Kil, Shardul M, MD .  sodium chloride flush (NS) 0.9 % injection 3 mL, 3 mL, Intravenous, PRN, Olga Coaster, MD .  traMADol (ULTRAM) tablet 50 mg, 50 mg, Oral, Q8H PRN, Olga Coaster, MD . aspirin  81 mg Oral QHS  . atorvastatin  80 mg Oral Daily  . carvedilol  3.125 mg Oral BID WC  . clopidogrel  75 mg Oral Daily  . enoxaparin (LOVENOX) injection  40 mg Subcutaneous Q24H  . furosemide  60 mg Intravenous Q12H  . levothyroxine  100 mcg Oral Q0600  . pantoprazole  40 mg Oral Daily  . potassium chloride  40 mEq Oral Q3H  . sacubitril-valsartan  1 tablet Oral BID  . sodium chloride flush  3 mL Intravenous Q12H   . sodium chloride    . nitroGLYCERIN 130 mcg/min (12/08/19 0729)    Physical Exam: Blood pressure 130/83, pulse 80, temperature 97.7 F (36.5 C), temperature source Axillary, resp. rate 19, SpO2 94 %.    Affect appropriate Obese male  HEENT: normal on bipap  Neck supple with no adenopathy JVP normal no bruits no  thyromegaly Lungs basilar rales  Heart:  S1/S2 no murmur, no rub, gallop or click PMI normal Abdomen: benighn, BS positve, no tenderness, no AAA no bruit.  No HSM or HJR Distal pulses intact with no bruits Plus one bilateral  edema Neuro non-focal Skin warm and dry No muscular weakness   Labs:   Lab Results  Component Value Date   WBC 15.3 (H) 12/08/2019   HGB 15.0 12/08/2019   HCT 46.4 12/08/2019   MCV 97.7 12/08/2019   PLT 156 12/08/2019    Recent Labs  Lab 12/07/19 2306 12/08/19 0001 12/08/19 0205  NA   < >  --  135  K   < >  --  3.0*  CL   < >  --  100  CO2   < >  --  26  BUN   < >  --  15  CREATININE   < >  --  1.01  CALCIUM   < >  --  8.5*  PROT  --  7.6  --   BILITOT  --  0.6  --   ALKPHOS  --  140*  --   ALT  --  17  --   AST  --  18  --   GLUCOSE   < >  --  162*   < > = values in this interval not displayed.   Lab Results  Component Value Date   TROPONINI <0.03 03/13/2016    Lab Results  Component Value Date   CHOL 136 06/19/2019   Lab Results  Component Value Date   HDL 27 (L) 06/19/2019   Lab Results  Component Value Date   LDLCALC 75 06/19/2019   Lab Results  Component Value Date   TRIG 293 (H) 10/08/2019   TRIG 258 (H) 06/19/2019   Lab Results  Component Value Date   CHOLHDL 5.0 (H) 06/19/2019   No results found for: LDLDIRECT    Radiology: [facility] Chest Port 1 View  Result Date: 12/07/2019 CLINICAL DATA:  Chest pain and shortness of breath EXAM: PORTABLE CHEST 1 VIEW COMPARISON:  10/08/2019 FINDINGS: Cardiac shadow is stable. Lungs are again well aerated. Diffuse interstitial markings are noted increased from the prior exam with evidence of interstitial edema. No focal confluent infiltrate or sizable effusion is seen. IMPRESSION: Vascular congestion and edema consistent with CHF. This is increased from the prior exam. Electronically Signed   By: Alcide Clever M.D.   On: 12/07/2019 23:31    EKG: ST no acute ischemic changes     ASSESSMENT AND PLAN:   1. CHF: related to non compliance with meds and diuretics Doing better this am continue 60 mg iv bid lasix today change to PO in am Continue coreg and entresto   2. CAD:  No chest pain or ischemic changes troponin minimally elevated with no trend continue plavix  3. HLD:   Continue statin   4. Thyroid: on replacement no recent TSH in chart consider checking   Signed: Charlton Haws 12/08/2019, 8:10 AM

## 2019-12-08 NOTE — Progress Notes (Signed)
Pt oxygen saturations are 88-90% on room air while sleeping and doing simple ADL's. 2L O2 applied, MD made aware. Pt external catheter removed and educated to use urinal. MD encouraged patient to sit up in the chair more throughout the day. Pt sat up for approx. 10 minutes and wanted to go back to bed as he states, "he didn't get any sleep last night." Educated patient to sit up in chair more especially for meals. Will continue to monitor.

## 2019-12-08 NOTE — ED Notes (Signed)
Patient given one time dose xopenex /atrovent he is not wheezing , but appears to air trap and has long exhalation phase.  Has long hx smoking -still smokes.

## 2019-12-08 NOTE — Progress Notes (Signed)
**Note De-Identified Bruce Barrera Obfuscation** Patient removed from BIPAP by RN; on RA with SAT 92% while sittinging on side of bed. Patient tolerating  well.  RRT to continue to monitor.

## 2019-12-08 NOTE — H&P (Addendum)
History and Physical    Patient Demographics:    Bruce Barrera XAJ:[id number] DOB: [birth year] DOA: 12/07/2019  PCP: Bruce Albee, MD  Patient coming from: Home  I have personally briefly reviewed patient's old medical records in [place]  Chief Complaint: Shortness of breath   Assessment & Plan:     Assessment/Plan Principal Problem:   Acute on chronic heart failure with preserved ejection fraction (HFpEF) ([place]) Active Problems:   Essential hypertension, benign   Mixed hyperlipidemia   Morbid obesity ([place])   Hypothyroidism, adult   Acute respiratory failure with hypoxia ([place])   Obstructive sleep apnea     Principal Problem: Pulmonary edema secondary to acute on chronic congestive heart failure Patient presented with worsening shortness of breath, bilateral lower extremity swelling, orthopnea, elevated BNP.  Has missed his Lasix dose over the last 2 days.  Mild elevated troponins.  EKG shows no ischemic changes. Most recent echocardiogram in January 2021 showed EF of 50 to 55% with multiple wall motion abnormalities with apical hypokinesis, mild LVH -We will place Lasix -Continue Entresto -Monitor input output, renal function, daily weights -Cardiology consult in a.m.  Other Active Problems: Hypertensive urgency -Continue nitroglycerin drip and titrate  Acute hypoxemic respiratory failure Patient requiring BiPAP on presentation due to severe respiratory distress. -We will continue BiPAP for now, titrate oxygen.  Can wean off BiPAP and placed on nasal cannula when respiratory status has improved  Chronic obstructive pulmonary disease Shortness of breath currently appears to be secondary to CHF exacerbation. -Continue albuterol as needed  Coronary artery disease s/p stent placement Patient did report some mild chest pain associated with shortness of breath.  EKG shows no ischemic changes.  Troponin is slightly elevated as noted below. -Continue aspirin,  Plavix  Elevated troponins Mild elevation in troponins as noted, likely demand ischemia secondary to pulmonary edema.  Does not appear to be consistent with ACS.  EKG shows no acute changes from prior.  Hyperlipidemia -Continue Lipitor  Hypothyroidism -Continue levothyroxine  Obstructive sleep apnea -Patient is being evaluated via sleep study.  Will need to follow-up as outpatient  Abdominal aortic aneurysm s/p stent placement Stable, follow-up as outpatient  Gastroesophageal reflux disease -Continue pantoprazole  DVT prophylaxis: Lovenox Code Status:  Full code Family Communication: N/A  Disposition Plan: Admitted as inpatient for treatment of pulmonary edema, hypertensive urgency, expect 3 to 4 days patient stay Consults called: N/A Admission status: Inpatient status    HPI:     HPI: Bruce Barrera is a 69 y.o. male with medical history significant of  ischemic cardiomyopathy, heart attack, history of stroke, hypothyroidism, hyperlipidemia, reflux disease, hypertension, chronic systolic CHF, AAA, with s/p repair, vitamin D deficiency, peptic ulcer, arthritis, smoking, COPD, no longer on O2, and obesity who presented to the ER with worsening shortness of breath. Patient had not taken his Lasix for the last 2 days as he was traveling. He reports having orthopnea, increased bilateral lower extremity swelling.  Also has mild associated cough with no expectoration.  He reports having some retrosternal chest pain associated with the shortness of breath which resolved by itself after a few minutes.  Described as pressure-like, nonradiating, moderate intensity, no exacerbating or relieving factors, associated with shortness of breath. No nausea, vomiting, abdominal pain, diarrhea, dysuria, hematemesis, melena, hematochezia No palpitations, fever, chills, dizziness, lightheadedness, seizures, syncope ED Course:  Vital Signs reviewed on presentation, significant for temperature 97.7,  heart rate 101, blood pressure 141/94, saturation 98% on BiPAP. Labs reviewed, significant for  sodium 137, potassium 3.7, BUN 15, creatinine 1.0, LFTs within normal limits, BNP 994, first 1 and 37, lactic acid negative, WBC count 15.3, hemoglobin 15.0, hematocrit 46, platelets 156.  SARS-CoV-2 RT-PCR as well as flu PCR is negative. Imaging personally Reviewed, chest x-ray shows vascular congestion and pulmonary edema consistent with CHF.  Worsened from prior exam. EKG personally reviewed, shows sinus tachycardia, left anterior fascicular block, PACs, mild ST depressions consistent with strain pattern.    Review of systems:    Review of Systems: As per HPI otherwise 10 point review of systems negative.  All other review of systems is negative except the ones noted above in the HPI.    Past Medical and Surgical History:  Reviewed by me  Past Medical History:  Diagnosis Date  . AAA (abdominal aortic aneurysm) ([facility])   . Arthritis    Hips and knees  . Back pain   . Chronic systolic CHF (congestive heart failure) ([facility])   . Coronary artery disease    a. 03/2016 subacute anterior MI -> 100% prox LAD >72 hours out thus treated medically, LVEF 30-35% initially  . Essential hypertension   . GERD (gastroesophageal reflux disease)   . Hemorrhoids   . History of stroke    States "stroke" with left sided weakness 5/95 in prison in [place]  . Hyperlipidemia   . Hypothyroidism   . Ischemic cardiomyopathy   . MI (myocardial infarction) ([facility])    States "heart attack" 2/95 in prison in [place]  . Obesity   . Peptic ulcer    GIB 1994  . Sleep apnea   . Vitamin D deficiency disease 06/19/2019    Past Surgical History:  Procedure Laterality Date  . ABDOMINAL AORTIC ENDOVASCULAR STENT GRAFT N/A 09/09/2019   Procedure: ABDOMINAL AORTIC ENDOVASCULAR STENT GRAFT;  Surgeon: Maeola Harman, MD;  Location: [facility] OR;  Service: Vascular;  Laterality: N/A;  . ADJUSTABLE SUTURE MANIPULATION Left 04/10/2018    Procedure: ADJUSTABLE SUTURE MANIPULATION;  Surgeon: Aura Camps, MD;  Location: [facility] OR;  Service: Ophthalmology;  Laterality: Left;  . CARDIAC CATHETERIZATION N/A 03/15/2016   Procedure: Right/Left Heart Cath and Coronary Angiography;  Surgeon: Iran Ouch, MD;  Location: [facility] INVASIVE CV LAB;  Service: Cardiovascular;  Laterality: N/A;  . COLONOSCOPY  03/23/2011   hemorrhoids  . KNEE ARTHROSCOPY     Right knee x 2  . MEDIAN RECTUS REPAIR Left 04/10/2018   Procedure: LEFT MEDIAN RECTUS REPAIR;  Surgeon: Aura Camps, MD;  Location: [facility] OR;  Service: Ophthalmology;  Laterality: Left;  Marland Kitchen MUSCLE RECESSION AND RESECTION Left 04/10/2018   Procedure: LEFT MUSCLE RECESSION;  Surgeon: Aura Camps, MD;  Location: [facility] OR;  Service: Ophthalmology;  Laterality: Left;  . TOTAL HIP ARTHROPLASTY Right 2014     Social History:  Reviewed by me   reports that he has been smoking cigarettes. He started smoking about 58 years ago. He has been smoking about 1.00 pack per day. He has never used smokeless tobacco. He reports that he does not drink alcohol or use drugs.  Allergies:    No Known Allergies  Family History :   Family History  Problem Relation Age of Onset  . Hypertension Mother   . Diabetes Mother   . Heart disease Mother   . Stroke Mother   . Stroke Father   . Heart attack Brother   . Aneurysm Brother   . Cirrhosis Brother    Family history reviewed, noted as above, not pertinent to current  presentation.   Home Medications:    Prior to Admission medications   Medication Sig Start Date End Date Taking? Authorizing Provider  aspirin 81 MG tablet Take 1 tablet (81 mg total) by mouth at bedtime. 10/10/19   Verdene Lennert, MD  atorvastatin (LIPITOR) 80 MG tablet Take 1 tablet (80 mg total) by mouth daily. 11/27/19   Wilson Singer, MD  carvedilol (COREG) 3.125 MG tablet Take 1 tablet (3.125 mg total) by mouth 2 (two) times daily with a meal. 11/27/19   Karilyn Cota, Nimish C, MD   clopidogrel (PLAVIX) 75 MG tablet Take 1 tablet (75 mg total) by mouth daily. 11/27/19   Wilson Singer, MD  furosemide (LASIX) 40 MG tablet Take 1 tablet (40 mg total) by mouth daily. 11/27/19   Wilson Singer, MD  levothyroxine (SYNTHROID) 100 MCG tablet Take 1 tablet (100 mcg total) by mouth daily before breakfast. 11/27/19   Gosrani, Nimish C, MD  pantoprazole (PROTONIX) 40 MG tablet TAKE (1) TABLET BY MOUTH AT BEDTIME. 11/27/19   Gosrani, Nimish C, MD  sacubitril-valsartan (ENTRESTO) 24-26 MG Take 1 tablet by mouth 2 (two) times daily. 11/27/19   Wilson Singer, MD    Physical Exam:    Physical Exam: Vitals:   12/08/19 0059 12/08/19 0100 12/08/19 0110 12/08/19 0123  BP:  (!) 152/111 (!) 144/99 (!) 150/104  Pulse: (!) 111 (!) 110 (!) 111 (!) 110  Resp: (!) 25 (!) 28 (!) 24 18  Temp:      TempSrc:      SpO2: 97% 94% 94% 94%    Constitutional: NAD, calm, comfortable Vitals:   12/08/19 0059 12/08/19 0100 12/08/19 0110 12/08/19 0123  BP:  (!) 152/111 (!) 144/99 (!) 150/104  Pulse: (!) 111 (!) 110 (!) 111 (!) 110  Resp: (!) 25 (!) 28 (!) 24 18  Temp:      TempSrc:      SpO2: 97% 94% 94% 94%   Eyes: PERRL, lids and conjunctivae normal ENMT: Mucous membranes are moist. Posterior pharynx clear of any exudate or lesions.Normal dentition.  Neck: normal, supple, no masses, no thyromegaly Respiratory: Bilateral basal crepitations, decreased air entry at bases, mild respiratory distress, no accessory muscle use.  Cardiovascular: Tachycardia, regular rhythm, no murmurs / rubs / gallops.  Bilateral 3+ pedal edema. 2+ pedal pulses. No carotid bruits.  Abdomen: no tenderness, no masses palpated. No hepatosplenomegaly. Bowel sounds positive.  Musculoskeletal: no clubbing / cyanosis. No joint deformity upper and lower extremities. Good ROM, no contractures. Normal muscle tone.  Skin: no rashes, lesions, ulcers. No induration Neurologic: CN 2-12 grossly intact. Sensation intact, DTR  normal. Strength 5/5 in all 4.  Psychiatric: Normal judgment and insight. Alert and oriented x 3. Normal mood.    Decubitus Ulcers: Not present on admission Catheters and tubes: None  Data Review:    Labs on Admission: I have personally reviewed following labs and imaging studies  CBC: Recent Labs  Lab 12/07/19 2306  WBC 14.8*  HGB 16.2  HCT 50.2  MCV 96.7  PLT 190   Basic Metabolic Panel: Recent Labs  Lab 12/07/19 2306  NA 137  K 3.7  CL 100  CO2 28  GLUCOSE 109*  BUN 15  CREATININE 1.00  CALCIUM 9.1   GFR: CrCl cannot be calculated (Unknown ideal weight.). Liver Function Tests: Recent Labs  Lab 12/08/19 0001  AST 18  ALT 17  ALKPHOS 140*  BILITOT 0.6  PROT 7.6  ALBUMIN 3.9   No  results for input(s): LIPASE, AMYLASE in the last 168 hours. No results for input(s): AMMONIA in the last 168 hours. Coagulation Profile: No results for input(s): INR, PROTIME in the last 168 hours. Cardiac Enzymes: No results for input(s): CKTOTAL, CKMB, CKMBINDEX, TROPONINI in the last 168 hours. BNP (last 3 results) No results for input(s): PROBNP in the last 8760 hours. HbA1C: No results for input(s): HGBA1C in the last 72 hours. CBG: No results for input(s): GLUCAP in the last 168 hours. Lipid Profile: No results for input(s): CHOL, HDL, LDLCALC, TRIG, CHOLHDL, LDLDIRECT in the last 72 hours. Thyroid Function Tests: No results for input(s): TSH, T4TOTAL, FREET4, T3FREE, THYROIDAB in the last 72 hours. Anemia Panel: No results for input(s): VITAMINB12, FOLATE, FERRITIN, TIBC, IRON, RETICCTPCT in the last 72 hours. Urine analysis:    Component Value Date/Time   COLORURINE AMBER (A) 09/15/2019 1241   APPEARANCEUR CLEAR 09/15/2019 1241   LABSPEC 1.024 09/15/2019 1241   PHURINE 6.0 09/15/2019 1241   GLUCOSEU NEGATIVE 09/15/2019 1241   HGBUR SMALL (A) 09/15/2019 1241   BILIRUBINUR NEGATIVE 09/15/2019 1241   KETONESUR 5 (A) 09/15/2019 1241   PROTEINUR 100 (A)  09/15/2019 1241   NITRITE NEGATIVE 09/15/2019 1241   LEUKOCYTESUR NEGATIVE 09/15/2019 1241     Imaging Results:      Radiological Exams on Admission: DG Chest Port 1 View  Result Date: 12/07/2019 CLINICAL DATA:  Chest pain and shortness of breath EXAM: PORTABLE CHEST 1 VIEW COMPARISON:  10/08/2019 FINDINGS: Cardiac shadow is stable. Lungs are again well aerated. Diffuse interstitial markings are noted increased from the prior exam with evidence of interstitial edema. No focal confluent infiltrate or sizable effusion is seen. IMPRESSION: Vascular congestion and edema consistent with CHF. This is increased from the prior exam. Electronically Signed   By: Alcide Clever M.D.   On: 12/07/2019 23:31      Danity Schmelzer Cyndie Chime MD [facility]  If 7PM-7AM, please contact night-coverage   12/08/2019, 1:32 AM

## 2019-12-08 NOTE — Progress Notes (Signed)
  Patient seen and evaluated, chart reviewed, please see EMR for updated orders. Please see full H&P dictated by admitting physician Dr. Teodoro Kil for same date of service.    Brief Summary 69 year old with past medical history relevant for CAD with anterior MI 2017 EF 30-35% Rx medically Last echo done 10/09/19 with EF 50-55%, dilated cardiomyopathy and HFpEF--- admitted on 12/08/2019 with CHF flareup in the setting of medication noncompliance   A/p  1)HFpEF--- acute diastolic CHF exacerbation, in the setting of medication noncompliance PTA--- cardiology consult appreciated continue IV Lasix -Fluid input/output and daily weight monitoring -Per cardiologist okay to continue Entresto and Coreg  2) acute hypoxic respiratory failure--- secondary to #1 above, required BiPAP initially has been transitioned to nasal cannula  3)H/o CAD--- no frank ACS type symptoms at this time, elevated troponin noted, cardiology input appreciated, continue Plavix and Coreg and atorvastatin   4)H/o COPD--- no acute exacerbation at this time  5)Hypothyroidism--- continue atorvastatin, check TSH in a.m.  6)Obesity  with Possible OSA--- patient was actually scheduled for sleep study this week but now is in the hospital -Will need sleep study as outpatient post discharge prior to being able to obtain a CPAP machine may use CPAP here nightly  7)PAD--troponin I status post prior stent----continue Plavix and Lipitor  8)Hypertensive Crisis-- BP control remains challenging, currently on IV nitro drip for BP control --- Plan to transition to oral BP meds possibly on 12/09/2019 in a.m.----   Shon Hale, MD

## 2019-12-09 ENCOUNTER — Inpatient Hospital Stay (HOSPITAL_COMMUNITY): Payer: Medicare Other

## 2019-12-09 DIAGNOSIS — I160 Hypertensive urgency: Secondary | ICD-10-CM

## 2019-12-09 DIAGNOSIS — I25118 Atherosclerotic heart disease of native coronary artery with other forms of angina pectoris: Secondary | ICD-10-CM

## 2019-12-09 DIAGNOSIS — Z9889 Other specified postprocedural states: Secondary | ICD-10-CM

## 2019-12-09 DIAGNOSIS — G4733 Obstructive sleep apnea (adult) (pediatric): Secondary | ICD-10-CM

## 2019-12-09 LAB — BASIC METABOLIC PANEL
Anion gap: 8 (ref 5–15)
BUN: 21 mg/dL (ref 8–23)
CO2: 32 mmol/L (ref 22–32)
Calcium: 8.3 mg/dL — ABNORMAL LOW (ref 8.9–10.3)
Chloride: 95 mmol/L — ABNORMAL LOW (ref 98–111)
Creatinine, Ser: 1.03 mg/dL (ref 0.61–1.24)
GFR calc Af Amer: >60 mL/min (ref >60)
GFR calc non Af Amer: >60 mL/min (ref >60)
Glucose, Bld: 100 mg/dL — ABNORMAL HIGH (ref 70–99)
Potassium: 2.9 mmol/L — ABNORMAL LOW (ref 3.5–5.1)
Sodium: 135 mmol/L (ref 135–145)

## 2019-12-09 LAB — CBC
HCT: 41.1 % (ref 39.0–52.0)
Hemoglobin: 13.3 g/dL (ref 13.0–17.0)
MCH: 31.3 pg (ref 26.0–34.0)
MCHC: 32.4 g/dL (ref 30.0–36.0)
MCV: 96.7 fL (ref 80.0–100.0)
Platelets: 152 10*3/uL (ref 150–400)
RBC: 4.25 MIL/uL (ref 4.22–5.81)
RDW: 15.2 % (ref 11.5–15.5)
WBC: 9.7 10*3/uL (ref 4.0–10.5)
nRBC: 0 % (ref 0.0–0.2)

## 2019-12-09 LAB — TROPONIN I (HIGH SENSITIVITY): Troponin I (High Sensitivity): 29 ng/L — ABNORMAL HIGH (ref <18)

## 2019-12-09 MED ORDER — CEFDINIR 300 MG PO CAPS: 300.0000 mg | ORAL_CAPSULE | Freq: Two times a day (BID) | ORAL | 0 refills | Status: AC

## 2019-12-09 MED ORDER — DOXYCYCLINE HYCLATE 100 MG PO TABS: 100.0000 mg | ORAL_TABLET | Freq: Two times a day (BID) | ORAL | 0 refills | Status: DC

## 2019-12-09 MED ORDER — BUDESONIDE-FORMOTEROL FUMARATE 80-4.5 MCG/ACT IN AERO: 2.0000 | INHALATION_SPRAY | Freq: Two times a day (BID) | RESPIRATORY_TRACT | 12 refills | Status: DC

## 2019-12-09 MED ORDER — CARVEDILOL 6.25 MG PO TABS: 6.2500 mg | ORAL_TABLET | Freq: Two times a day (BID) | ORAL | 3 refills | Status: DC

## 2019-12-09 MED ORDER — CARVEDILOL 3.125 MG PO TABS
3.1250 mg | ORAL_TABLET | Freq: Once | ORAL | Status: AC
  Administered 2019-12-09: 3.125 mg via ORAL
  Filled 2019-12-09: qty 1

## 2019-12-09 MED ORDER — ACETAMINOPHEN 325 MG PO TABS: 650.0000 mg | ORAL_TABLET | Freq: Four times a day (QID) | ORAL | 2 refills | Status: DC | PRN

## 2019-12-09 MED ORDER — CARVEDILOL 3.125 MG PO TABS
6.2500 mg | ORAL_TABLET | Freq: Two times a day (BID) | ORAL | Status: DC
  Administered 2019-12-09: 16:00:00 6.25 mg via ORAL
  Filled 2019-12-09: qty 2

## 2019-12-09 MED ORDER — ASPIRIN 81 MG PO TABS: 81.0000 mg | ORAL_TABLET | Freq: Every day | ORAL | 2 refills | Status: AC

## 2019-12-09 MED ORDER — MUCINEX 600 MG PO TB12: 600.0000 mg | ORAL_TABLET | Freq: Two times a day (BID) | ORAL | 0 refills | Status: AC

## 2019-12-09 MED ORDER — POTASSIUM CHLORIDE CRYS ER 20 MEQ PO TBCR
40.0000 meq | EXTENDED_RELEASE_TABLET | ORAL | Status: AC
  Administered 2019-12-09 (×2): 40 meq via ORAL
  Filled 2019-12-09 (×2): qty 2

## 2019-12-09 MED ORDER — POTASSIUM CHLORIDE ER 20 MEQ PO TBCR: 20.0000 meq | EXTENDED_RELEASE_TABLET | ORAL | 1 refills | Status: DC

## 2019-12-09 MED ORDER — CEFDINIR 300 MG PO CAPS: 300.0000 mg | ORAL_CAPSULE | Freq: Two times a day (BID) | ORAL | 0 refills | Status: DC

## 2019-12-09 MED ORDER — DOXYCYCLINE HYCLATE 100 MG PO TABS: 100.0000 mg | ORAL_TABLET | Freq: Two times a day (BID) | ORAL | 0 refills | Status: AC

## 2019-12-09 MED ORDER — CLOPIDOGREL BISULFATE 75 MG PO TABS: 75.0000 mg | ORAL_TABLET | Freq: Every day | ORAL | 5 refills | Status: DC

## 2019-12-09 MED ORDER — PANTOPRAZOLE SODIUM 40 MG PO TBEC: DELAYED_RELEASE_TABLET | ORAL | 2 refills | Status: DC

## 2019-12-09 NOTE — Progress Notes (Signed)
Pt ambulated approx. 150 feet around the unit. Tolerated well. Oxygen saturations stayed  93% or above on room air the whole time. MD made aware.

## 2019-12-09 NOTE — Progress Notes (Signed)
Nitroglycerin gtt stopped at 1131. BP have been stable. MD made aware. Pt has been very adamant and anxious about going home. Will continue to monitor.

## 2019-12-09 NOTE — Progress Notes (Signed)
Nsg Discharge Note  Admit Date:  12/07/2019 Discharge date: 12/09/2019   Bruce Barrera to be D/C'd home per MD order.  AVS completed.  Copy for chart, and copy for patient signed, and dated. Patient/caregiver able to verbalize understanding.  Discharge Medication: Allergies as of 12/09/2019   No Known Allergies     Medication List    STOP taking these medications   budesonide-formoterol 160-4.5 MCG/ACT inhaler Commonly known as: SYMBICORT Replaced by: budesonide-formoterol 80-4.5 MCG/ACT inhaler     TAKE these medications   acetaminophen 325 MG tablet Commonly known as: TYLENOL Take 2 tablets (650 mg total) by mouth every 6 (six) hours as needed for mild pain, fever or headache (or Fever >/= 101).   aspirin 81 MG tablet Take 1 tablet (81 mg total) by mouth daily with breakfast. What changed: when to take this   atorvastatin 80 MG tablet Commonly known as: LIPITOR Take 1 tablet (80 mg total) by mouth daily.   budesonide-formoterol 80-4.5 MCG/ACT inhaler Commonly known as: Symbicort Inhale 2 puffs into the lungs 2 (two) times daily. Replaces: budesonide-formoterol 160-4.5 MCG/ACT inhaler   carvedilol 6.25 MG tablet Commonly known as: COREG Take 1 tablet (6.25 mg total) by mouth 2 (two) times daily with a meal. Start taking on: December 10, 2019 What changed:   medication strength  how much to take   cefdinir 300 MG capsule Commonly known as: OMNICEF Take 1 capsule (300 mg total) by mouth 2 (two) times daily for 7 days.   clopidogrel 75 MG tablet Commonly known as: PLAVIX Take 1 tablet (75 mg total) by mouth daily.   doxycycline 100 MG tablet Commonly known as: VIBRA-TABS Take 1 tablet (100 mg total) by mouth 2 (two) times daily for 7 days.   fish oil-omega-3 fatty acids 1000 MG capsule Take 2 g by mouth daily.   furosemide 40 MG tablet Commonly known as: LASIX Take 1 tablet (40 mg total) by mouth daily.   levothyroxine 100 MCG tablet Commonly known as:  Synthroid Take 1 tablet (100 mcg total) by mouth daily before breakfast.   pantoprazole 40 MG tablet Commonly known as: PROTONIX TAKE (1) TABLET BY MOUTH AT BEDTIME.   Potassium Chloride ER 20 MEQ Tbcr Take 20 mEq by mouth every Monday, Wednesday, and Friday. 1 tab daily by mouth Start taking on: December 10, 2019   sacubitril-valsartan 24-26 MG Commonly known as: ENTRESTO Take 1 tablet by mouth 2 (two) times daily.       Discharge Assessment: Vitals:   12/09/19 1630 12/09/19 1745  BP: (!) 127/91   Pulse: 68   Resp: (!) 23   Temp:    SpO2: 92% 93%   Skin clean, dry and intact without evidence of skin break down, no evidence of skin tears noted. IV catheter discontinued intact. Site without signs and symptoms of complications - no redness or edema noted at insertion site, patient denies c/o pain - only slight tenderness at site.  Dressing with slight pressure applied.  D/c Instructions-Education: Discharge instructions given to patient/family with verbalized understanding. D/c education completed with patient/family including follow up instructions, medication list, d/c activities limitations if indicated, with other d/c instructions as indicated by MD - patient able to verbalize understanding, all questions fully answered. Patient instructed to return to ED, call 911, or call MD for any changes in condition.  Patient escorted via WC, and D/C home via private auto.  Diego Cory, RN 12/09/2019 6:04 PM

## 2019-12-09 NOTE — Discharge Instructions (Signed)
1)Very low-salt diet advised 2)Weigh yourself daily, call if you gain more than 3 pounds in 1 day or more than 5 pounds in 1 week as your diuretic medications may need to be adjusted 3)Limit your Fluid  intake to no more than 60 ounces (1.8 Liters) per day 4)Avoid ibuprofen/Advil/Aleve/Motrin/Goody Powders/Naproxen/BC powders/Meloxicam/Diclofenac/Indomethacin and other Nonsteroidal anti-inflammatory medications as these will make you more likely to bleed and can cause stomach ulcers, can also cause Kidney problems.  5)Follow-up with PCP in 1 weeks for Repeat BMP

## 2019-12-09 NOTE — Progress Notes (Addendum)
Progress Note  Patient Name: Bruce Barrera Date of Encounter: 12/09/2019  Primary Cardiologist: Nona Dell, MD   Subjective   Breathing improved. No orthopnea or PND overnight. No chest pain or palpitations. Does have a headache.   Inpatient Medications    Scheduled Meds: . aspirin  81 mg Oral QHS  . atorvastatin  80 mg Oral Daily  . carvedilol  3.125 mg Oral BID WC  . Chlorhexidine Gluconate Cloth  6 each Topical Daily  . clopidogrel  75 mg Oral Daily  . enoxaparin (LOVENOX) injection  40 mg Subcutaneous Q24H  . furosemide  60 mg Intravenous Q12H  . levothyroxine  100 mcg Oral Q0600  . pantoprazole  40 mg Oral Daily  . sacubitril-valsartan  1 tablet Oral BID  . sodium chloride flush  3 mL Intravenous Q12H   Continuous Infusions: . sodium chloride    . nitroGLYCERIN 50 mcg/min (12/09/19 0600)   PRN Meds: sodium chloride, acetaminophen **OR** acetaminophen, bisacodyl, ondansetron **OR** ondansetron (ZOFRAN) IV, polyethylene glycol, sodium chloride flush, traMADol   Vital Signs    Vitals:   12/09/19 0600 12/09/19 0725 12/09/19 0730 12/09/19 0745  BP: 116/70 119/66 127/74 120/69  Pulse: 89 91 89 87  Resp: 16 (!) 21 14 17   Temp:      TempSrc:      SpO2: 92% 90% 95% 92%  Weight:        Intake/Output Summary (Last 24 hours) at 12/09/2019 0803 Last data filed at 12/09/2019 0700 Gross per 24 hour  Intake 1167.55 ml  Output 5600 ml  Net -4432.45 ml    Last 3 Weights 12/09/2019 12/08/2019 11/20/2019  Weight (lbs) 249 lb 1.9 oz (No Data) 258 lb 8 oz  Weight (kg) 113 kg (No Data) 117.255 kg      Telemetry    NSR, HR in 80's to 90's. Occasional PVC's. - Personally Reviewed  ECG    No new tracings.   Physical Exam   General: Well developed, well nourished, male appearing in no acute distress. Head: Normocephalic, atraumatic.  Neck: Supple without bruits, JVD not elevated. Lungs:  Resp regular and unlabored, mild rales along bases bilaterally. Heart: RRR,  S1, S2, no S3, S4, or murmur; no rub. Abdomen: Soft, non-tender, non-distended with normoactive bowel sounds. No hepatomegaly. No rebound/guarding. No obvious abdominal masses. Extremities: No clubbing, cyanosis, 1+ pitting edema along LLE, trace edema long RLE. Distal pedal pulses are 2+ bilaterally. Neuro: Alert and oriented X 3. Moves all extremities spontaneously. Psych: Normal affect.  Labs    Chemistry Recent Labs  Lab 12/07/19 2306 12/08/19 0001 12/08/19 0205 12/09/19 0407  NA 137  --  135 135  K 3.7  --  3.0* 2.9*  CL 100  --  100 95*  CO2 28  --  26 32  GLUCOSE 109*  --  162* 100*  BUN 15  --  15 21  CREATININE 1.00  --  1.01 1.03  CALCIUM 9.1  --  8.5* 8.3*  PROT  --  7.6  --   --   ALBUMIN  --  3.9  --   --   AST  --  18  --   --   ALT  --  17  --   --   ALKPHOS  --  140*  --   --   BILITOT  --  0.6  --   --   GFRNONAA >60  --  >60 >60  GFRAA >60  --  >60 >  60  ANIONGAP 9  --  9 8     Hematology Recent Labs  Lab 12/07/19 2306 12/08/19 0205 12/09/19 0407  WBC 14.8* 15.3* 9.7  RBC 5.19 4.75 4.25  HGB 16.2 15.0 13.3  HCT 50.2 46.4 41.1  MCV 96.7 97.7 96.7  MCH 31.2 31.6 31.3  MCHC 32.3 32.3 32.4  RDW 14.6 14.9 15.2  PLT 190 156 152    Cardiac EnzymesNo results for input(s): TROPONINI in the last 168 hours. No results for input(s): TROPIPOC in the last 168 hours.   BNP Recent Labs  Lab 12/08/19 0001  BNP 994.0*     DDimer No results for input(s): DDIMER in the last 168 hours.   Radiology    DG Chest Port 1 View  Result Date: 12/07/2019 CLINICAL DATA:  Chest pain and shortness of breath EXAM: PORTABLE CHEST 1 VIEW COMPARISON:  10/08/2019 FINDINGS: Cardiac shadow is stable. Lungs are again well aerated. Diffuse interstitial markings are noted increased from the prior exam with evidence of interstitial edema. No focal confluent infiltrate or sizable effusion is seen. IMPRESSION: Vascular congestion and edema consistent with CHF. This is increased  from the prior exam. Electronically Signed   By: Alcide Clever M.D.   On: 12/07/2019 23:31    Cardiac Studies   Echocardiogram: 09/2019 IMPRESSIONS    1. Left ventricular ejection fraction, by visual estimation, is 50 to  55%. The left ventricle has mildly decreased function. Left ventricular  septal wall thickness was mildly increased. Mildly increased left  ventricular posterior wall thickness. There  is mildly increased left ventricular hypertrophy.  2. Definity contrast agent was given IV to delineate the left ventricular  endocardial borders.  3. Elevated left ventricular end-diastolic pressure.  4. The left ventricle demonstrates regional wall motion abnormalities.  5. Apical anterior apical septal, and apical hypokinesis.  6. Global right ventricle has normal systolic function.The right  ventricular size is normal. No increase in right ventricular wall  thickness.  7. Left atrial size was normal.  8. Right atrial size was normal.  9. The mitral valve is normal in structure. Trivial mitral valve  regurgitation. No evidence of mitral stenosis.  10. The tricuspid valve is normal in structure.  11. The tricuspid valve is normal in structure. Tricuspid valve  regurgitation is trivial.  12. The aortic valve is normal in structure. Aortic valve regurgitation is  not visualized. No evidence of aortic valve sclerosis or stenosis.  13. The pulmonic valve was normal in structure. Pulmonic valve  regurgitation is not visualized.  14. Normal pulmonary artery systolic pressure.  15. The inferior vena cava is dilated in size with >50% respiratory  variability, suggesting right atrial pressure of 8 mmHg.   Patient Profile     69 y.o. male w/ PMH of CAD (anterior MI in 2017 with cath showing occluded LAD and medical management recommended), history of ischemic cardiomyopathy (EF 30-35% in 03/2016, improved to 50-55% by echo in 09/2019), chronic diastolic CHF, COPD, HTN, HLD and  ruptured AAA (s/p endograft repair in 08/2019) who is currently admitted for a CHF exacerbation after having not taken his medications for several days.   Assessment & Plan    1. Acute on Chronic Diastolic CHF Exacerbation - presented with worsening dyspnea on exertion and edema after having missed his medications for several days. Reports being under increased stress given his multiple hospitalizations and his significant other recently being diagnosed with breast cancer. Therefore, he forgets to take his medications at times.  -  BNP elevated to 994 on admission and CXR showed vascular congestion and edema.  - receiving IV Lasix 60mg  BID with a recorded net output of -4.1L thus far. Creatinine stable at 1.03. Weight this AM 249 lbs and he reports a baseline of 255 lbs (was 265 lbs in 08/2019).  Was on Lasix 40mg  daily prior to admission and would anticipate switching back to PO dosing tomorrow. We reviewed sodium and fluid restriction along with the importance of following daily weights.  - remains on Coreg and Entresto as outlined below.    2. Hypertensive Urgency - BP initially elevated in the ED after having not taken his medications for several days. Initially on IV NTG and this is still at 40 mcg/min as that was the minimum dose for the order. Discussed with nursing staff and will gradually wean this AM. Restarted on Coreg 3.125mg  BID and Entresto 24-26mg  BID with BP at 118/70 on most recent check. Will increase Coreg to 6.25mg  BID and can further adjust pending BP trend as this can be titrated along with Entresto.   3. CAD - he has a known occluded LAD by catheterization in 2017. Denies any recent chest pain and breathing has improved with diuresis.  - HS Troponin values have been flat this admission, peaking at 72. - continue ASA, Plavix, BB and statin therapy.   4. HLD - FLP In 06/2019 showed total cholesterol of 136, HDL 27, triglycerides 258 and LDL 75. Continue PTA Atorvastatin 80mg   daily.   5. History of Ruptured AAA  - s/p endograft repair in 08/2019. Followed by Vascular Surgery as an outpatient.   6. Suspected OSA - he was scheduled for a sleep study as an outpatient which will need to be rescheduled. Reports using a family member's CPAP in the past and experienced significant improvement in his respiratory status and sleep habits with this.   For questions or updates, please contact [place] Please consult [email or web address] for contact info under Cardiology/STEMI.   Arna Medici , PA-C 8:03 AM 12/09/2019 Pager: [phone]  The patient was seen and examined, and I agree with the history, physical exam, assessment and plan as documented above, with modifications made above and as noted below.  He feels much better this morning and is hoping to go home.  He denies chest pain and palpitations and says shortness of breath has improved.  He remains on IV nitroglycerin drip and is normotensive.  I discussed weaning the strip off with his nurse who was in the room at the time of my evaluation.  It appears he decompensated due to missing medications for several days as he forgets to take them at times due to being under increased stress.  He has been on IV Lasix 60 mg twice daily with good urinary output.  I would recommend switching back to oral Lasix 40 mg daily on 12/10/2019.  He is on carvedilol and Entresto.  Both of these medications can be increased as needed to control blood pressure.  With respect to coronary artery disease, he denies anginal symptoms and he can remain on dual antiplatelet therapy with aspirin and clopidogrel (as per his primary cardiologist) along with atorvastatin.  He will also need an outpatient sleep study as untreated sleep apnea will certainly exacerbate hypertension and contribute to decompensated heart failure.  From my standpoint he may be able to be discharged either later today or if need be, tomorrow  morning.   Kate Sable, MD, [facility]  12/09/2019 9:59 AM

## 2019-12-09 NOTE — Discharge Summary (Addendum)
Bruce Barrera, is a 69 y.o. male  DOB 1950/09/23  MRN [id number].  Admission date:  12/07/2019  Admitting Physician  Olga Coaster, MD  Discharge Date:  12/09/2019   Primary MD  Wilson Singer, MD  Recommendations for primary care physician for things  to follow:   Admission Diagnosis  SOB (shortness of breath) [R06.02] Hypoxia [R09.02] CHF exacerbation (HCC) [I50.9] Troponin level elevated [R77.8] Acute congestive heart failure, unspecified heart failure type (HCC) [I50.9]  Discharge Diagnosis  SOB (shortness of breath) [R06.02] Hypoxia [R09.02] CHF exacerbation (HCC) [I50.9] Troponin level elevated [R77.8] Acute congestive heart failure, unspecified heart failure type (HCC) [I50.9]    Principal Problem:   Acute on chronic heart failure with preserved ejection fraction (HFpEF) (HCC) Active Problems:   Essential hypertension, benign   Mixed hyperlipidemia   Morbid obesity (HCC)   Hypothyroidism, adult   Acute respiratory failure with hypoxia (HCC)   Obstructive sleep apnea   CHF exacerbation (HCC)      Past Medical History:  Diagnosis Date  . AAA (abdominal aortic aneurysm) (HCC)   . Arthritis    Hips and knees  . Back pain   . Chronic systolic CHF (congestive heart failure) (HCC)   . Coronary artery disease    a. 03/2016 subacute anterior MI -> 100% prox LAD >72 hours out thus treated medically, LVEF 30-35% initially  . Essential hypertension   . GERD (gastroesophageal reflux disease)   . Hemorrhoids   . History of stroke    States "stroke" with left sided weakness 5/95 in prison in [place]  . Hyperlipidemia   . Hypothyroidism   . Ischemic cardiomyopathy   . MI (myocardial infarction) (HCC)    States "heart attack" 2/95 in prison in [place]  . Obesity   . Peptic ulcer    GIB 1994  . Sleep apnea   . Vitamin D deficiency disease 06/19/2019    Past Surgical History:  Procedure  Laterality Date  . ABDOMINAL AORTIC ENDOVASCULAR STENT GRAFT N/A 09/09/2019   Procedure: ABDOMINAL AORTIC ENDOVASCULAR STENT GRAFT;  Surgeon: Maeola Harman, MD;  Location: [facility] OR;  Service: Vascular;  Laterality: N/A;  . ADJUSTABLE SUTURE MANIPULATION Left 04/10/2018   Procedure: ADJUSTABLE SUTURE MANIPULATION;  Surgeon: Aura Camps, MD;  Location: [facility] OR;  Service: Ophthalmology;  Laterality: Left;  . CARDIAC CATHETERIZATION N/A 03/15/2016   Procedure: Right/Left Heart Cath and Coronary Angiography;  Surgeon: Iran Ouch, MD;  Location: [facility] INVASIVE CV LAB;  Service: Cardiovascular;  Laterality: N/A;  . COLONOSCOPY  03/23/2011   hemorrhoids  . KNEE ARTHROSCOPY     Right knee x 2  . MEDIAN RECTUS REPAIR Left 04/10/2018   Procedure: LEFT MEDIAN RECTUS REPAIR;  Surgeon: Aura Camps, MD;  Location: [facility] OR;  Service: Ophthalmology;  Laterality: Left;  Marland Kitchen MUSCLE RECESSION AND RESECTION Left 04/10/2018   Procedure: LEFT MUSCLE RECESSION;  Surgeon: Aura Camps, MD;  Location: [facility] OR;  Service: Ophthalmology;  Laterality: Left;  . TOTAL HIP ARTHROPLASTY Right 2014  HPI  from the history and physical done on the day of admission:     HPI: Bruce Barrera is a 69 y.o. male with medical history significant of ischemic cardiomyopathy, heart attack, history of stroke, hypothyroidism, hyperlipidemia, reflux disease, hypertension, chronic systolic CHF, AAA, with s/p repair,vitamin D deficiency, peptic ulcer, arthritis, smoking, COPD, no longer on O2,and obesity who presented to the ER with worsening shortness of breath. Patient had not taken his Lasix for the last 2 days as he was traveling. He reports having orthopnea, increased bilateral lower extremity swelling.  Also has mild associated cough with no expectoration.  He reports having some retrosternal chest pain associated with the shortness of breath which resolved by itself after a few minutes.  Described as pressure-like,  nonradiating, moderate intensity, no exacerbating or relieving factors, associated with shortness of breath. No nausea, vomiting, abdominal pain, diarrhea, dysuria, hematemesis, melena, hematochezia No palpitations, fever, chills, dizziness, lightheadedness, seizures, syncope ED Course:  Vital Signs reviewed on presentation, significant for temperature 97.7, heart rate 101, blood pressure 141/94, saturation 98% on BiPAP. Labs reviewed, significant for sodium 137, potassium 3.7, BUN 15, creatinine 1.0, LFTs within normal limits, BNP 994, first 1 and 37, lactic acid negative, WBC count 15.3, hemoglobin 15.0, hematocrit 46, platelets 156.  SARS-CoV-2 RT-PCR as well as flu PCR is negative. Imaging personally Reviewed, chest x-ray shows vascular congestion and pulmonary edema consistent with CHF.  Worsened from prior exam. EKG personally reviewed, shows sinus tachycardia, left anterior fascicular block, PACs, mild ST depressions consistent with strain pattern.     Hospital Course:   Brief Summary 69 year old with past medical history relevant for CAD with anterior MI 2017 EF 30-35% Rx medically Last echo done 10/09/19 with EF 50-55%, dilated cardiomyopathy and HFpEF--- admitted on 12/08/2019 with CHF flareup in the setting of medication noncompliance  A/p  1)HFpEF--- acute diastolic CHF exacerbation, in the setting of medication noncompliance PTA--- cardiology consult appreciated -Echo from October 09, 2019 with EF of 50 to 55% --Treated with IV Lasix -Per cardiologist okay to continue Entresto and Coreg -And resume PTA Lasix -It appears patient was not very compliant PTA  2) acute hypoxic respiratory failure--- secondary to #1 above, required BiPAP initially has been transitioned to nasal cannula --Hypoxia resolved, patient ambulating on room air with O2 sats 93 to 95% on room air -Repeat chest x-ray on 12/09/2018 with CHF versus infection --Patient adequately diuresed okay to resume home dose  Lasix at 40 mg daily -Treat empirically with Omnicef and doxycycline for possible concomitant respiratory infection, -Leukocytosis has resolved patient has no fevers no chills  3)H/o CAD--- no frank ACS type symptoms at this time, elevated troponin noted, cardiology input appreciated, continue Plavix and Coreg and atorvastatin   4)H/o COPD--- no acute exacerbation at this time  5)Hypothyroidism--- continue levothyroxine, TSH is 3.8  6)Obesity  with Possible OSA--- patient was actually scheduled for sleep study this week but now is in the hospital -Will need sleep study as outpatient post discharge prior to being able to obtain a CPAP machine   7)PAD-- status post prior stent----continue Plavix and Lipitor -troponins noted, cardiology consult appreciated  8)Hypertensive Crisis-- BP control remains challenging,  treated with IV nitro drip for BP control -- transitioned to oral BP meds  on 12/09/2019 --BP remains stable on oral agents   Discharge Condition: Stable  Follow UP--PCP and cardiologist as advised   Consults obtained -cardiology  Diet and Activity recommendation:  As advised  Discharge Instructions  Discharge Instructions    Call MD for:  difficulty breathing, headache or visual disturbances   Complete by: As directed    Call MD for:  extreme fatigue   Complete by: As directed    Call MD for:  persistant dizziness or light-headedness   Complete by: As directed    Call MD for:  persistant nausea and vomiting   Complete by: As directed    Call MD for:  severe uncontrolled pain   Complete by: As directed    Call MD for:  temperature >100.4   Complete by: As directed    Diet - low sodium heart healthy   Complete by: As directed    Discharge instructions   Complete by: As directed    1)Very low-salt diet advised 2)Weigh yourself daily, call if you gain more than 3 pounds in 1 day or more than 5 pounds in 1 week as your diuretic medications may need to be  adjusted 3)Limit your Fluid  intake to no more than 60 ounces (1.8 Liters) per day 4)Avoid ibuprofen/Advil/Aleve/Motrin/Goody Powders/Naproxen/BC powders/Meloxicam/Diclofenac/Indomethacin and other Nonsteroidal anti-inflammatory medications as these will make you more likely to bleed and can cause stomach ulcers, can also cause Kidney problems.  5)Follow-up with PCP in 1 weeks for Repeat BMP   Increase activity slowly   Complete by: As directed         Discharge Medications     Allergies as of 12/09/2019   No Known Allergies     Medication List    STOP taking these medications   budesonide-formoterol 160-4.5 MCG/ACT inhaler Commonly known as: SYMBICORT Replaced by: budesonide-formoterol 80-4.5 MCG/ACT inhaler     TAKE these medications   acetaminophen 325 MG tablet Commonly known as: TYLENOL Take 2 tablets (650 mg total) by mouth every 6 (six) hours as needed for mild pain, fever or headache (or Fever >/= 101).   aspirin 81 MG tablet Take 1 tablet (81 mg total) by mouth daily with breakfast. What changed: when to take this   atorvastatin 80 MG tablet Commonly known as: LIPITOR Take 1 tablet (80 mg total) by mouth daily.   budesonide-formoterol 80-4.5 MCG/ACT inhaler Commonly known as: Symbicort Inhale 2 puffs into the lungs 2 (two) times daily. Replaces: budesonide-formoterol 160-4.5 MCG/ACT inhaler   carvedilol 6.25 MG tablet Commonly known as: COREG Take 1 tablet (6.25 mg total) by mouth 2 (two) times daily with a meal. Start taking on: December 10, 2019 What changed:   medication strength  how much to take   cefdinir 300 MG capsule Commonly known as: OMNICEF Take 1 capsule (300 mg total) by mouth 2 (two) times daily for 7 days.   clopidogrel 75 MG tablet Commonly known as: PLAVIX Take 1 tablet (75 mg total) by mouth daily.   doxycycline 100 MG tablet Commonly known as: VIBRA-TABS Take 1 tablet (100 mg total) by mouth 2 (two) times daily for 7 days.     fish oil-omega-3 fatty acids 1000 MG capsule Take 2 g by mouth daily.   furosemide 40 MG tablet Commonly known as: LASIX Take 1 tablet (40 mg total) by mouth daily.   levothyroxine 100 MCG tablet Commonly known as: Synthroid Take 1 tablet (100 mcg total) by mouth daily before breakfast.   pantoprazole 40 MG tablet Commonly known as: PROTONIX TAKE (1) TABLET BY MOUTH AT BEDTIME.   Potassium Chloride ER 20 MEQ Tbcr Take 20 mEq by mouth every Monday, Wednesday, and Friday. 1 tab daily by mouth Start taking  on: December 10, 2019   sacubitril-valsartan 24-26 MG Commonly known as: ENTRESTO Take 1 tablet by mouth 2 (two) times daily.       Major procedures and Radiology Reports - PLEASE review detailed and final reports for all details, in brief -    DG Chest 2 View  Result Date: 12/09/2019 CLINICAL DATA:  Shortness of breath EXAM: CHEST - 2 VIEW COMPARISON:  11/29/2019 FINDINGS: Bilateral diffuse interstitial thickening. No pleural effusion or pneumothorax. Stable cardiomediastinal silhouette. No aggressive osseous lesion. IMPRESSION: Bilateral diffuse interstitial thickening which may reflect mild interstitial edema versus infection. Electronically Signed   By: Elige Ko   On: 12/09/2019 13:31   DG Chest Port 1 View  Result Date: 12/07/2019 CLINICAL DATA:  Chest pain and shortness of breath EXAM: PORTABLE CHEST 1 VIEW COMPARISON:  10/08/2019 FINDINGS: Cardiac shadow is stable. Lungs are again well aerated. Diffuse interstitial markings are noted increased from the prior exam with evidence of interstitial edema. No focal confluent infiltrate or sizable effusion is seen. IMPRESSION: Vascular congestion and edema consistent with CHF. This is increased from the prior exam. Electronically Signed   By: Alcide Clever M.D.   On: 12/07/2019 23:31    Micro Results   Recent Results (from the past 240 hour(s))  SARS CORONAVIRUS 2 (TAT 6-24 HRS) Nasopharyngeal Nasopharyngeal Swab     Status:  None   Collection Time: 12/05/19  7:10 AM   Specimen: Nasopharyngeal Swab  Result Value Ref Range Status   SARS Coronavirus 2 NEGATIVE NEGATIVE Final    Comment: (NOTE) SARS-CoV-2 target nucleic acids are NOT DETECTED. The SARS-CoV-2 RNA is generally detectable in upper and lower respiratory specimens during the acute phase of infection. Negative results do not preclude SARS-CoV-2 infection, do not rule out co-infections with other pathogens, and should not be used as the sole basis for treatment or other patient management decisions. Negative results must be combined with clinical observations, patient history, and epidemiological information. The expected result is Negative. Fact Sheet for Patients: HairSlick.no Fact Sheet for Healthcare Providers: quierodirigir.com This test is not yet approved or cleared by the [place] FDA and  has been authorized for detection and/or diagnosis of SARS-CoV-2 by FDA under an Emergency Use Authorization (EUA). This EUA will remain  in effect (meaning this test can be used) for the duration of the COVID-19 declaration under Section 56 4(b)(1) of the Act, 21 U.S.C. section 360bbb-3(b)(1), unless the authorization is terminated or revoked sooner. Performed at [facility], [address] [place], [place] [address]   Respiratory Panel by RT PCR (Flu A&B, Covid) - Nasopharyngeal Swab     Status: None   Collection Time: 12/07/19 11:27 PM   Specimen: Nasopharyngeal Swab  Result Value Ref Range Status   SARS Coronavirus 2 by RT PCR NEGATIVE NEGATIVE Final    Comment: (NOTE) SARS-CoV-2 target nucleic acids are NOT DETECTED. The SARS-CoV-2 RNA is generally detectable in upper respiratoy specimens during the acute phase of infection. The lowest concentration of SARS-CoV-2 viral copies this assay can detect is 131 copies/mL. A negative result does not preclude SARS-Cov-2 infection and  should not be used as the sole basis for treatment or other patient management decisions. A negative result may occur with  improper specimen collection/handling, submission of specimen other than nasopharyngeal swab, presence of viral mutation(s) within the areas targeted by this assay, and inadequate number of viral copies (<131 copies/mL). A negative result must be combined with clinical observations, patient history, and  epidemiological information. The expected result is Negative. Fact Sheet for Patients:  PinkCheek.be Fact Sheet for Healthcare Providers:  GravelBags.it This test is not yet ap proved or cleared by the [place] FDA and  has been authorized for detection and/or diagnosis of SARS-CoV-2 by FDA under an Emergency Use Authorization (EUA). This EUA will remain  in effect (meaning this test can be used) for the duration of the COVID-19 declaration under Section 564(b)(1) of the Act, 21 U.S.C. section 360bbb-3(b)(1), unless the authorization is terminated or revoked sooner.    Influenza A by PCR NEGATIVE NEGATIVE Final   Influenza B by PCR NEGATIVE NEGATIVE Final    Comment: (NOTE) The Xpert Xpress SARS-CoV-2/FLU/RSV assay is intended as an aid in  the diagnosis of influenza from Nasopharyngeal swab specimens and  should not be used as a sole basis for treatment. Nasal washings and  aspirates are unacceptable for Xpert Xpress SARS-CoV-2/FLU/RSV  testing. Fact Sheet for Patients: PinkCheek.be Fact Sheet for Healthcare Providers: GravelBags.it This test is not yet approved or cleared by the [place] FDA and  has been authorized for detection and/or diagnosis of SARS-CoV-2 by  FDA under an Emergency Use Authorization (EUA). This EUA will remain  in effect (meaning this test can be used) for the duration of the  Covid-19 declaration under Section  564(b)(1) of the Act, 21  U.S.C. section 360bbb-3(b)(1), unless the authorization is  terminated or revoked. Performed at [facility], [address] [place], [place] [address]   MRSA PCR Screening     Status: None   Collection Time: 12/08/19  3:51 AM   Specimen: Nasal Mucosa; Nasopharyngeal  Result Value Ref Range Status   MRSA by PCR NEGATIVE NEGATIVE Final    Comment:        The GeneXpert MRSA Assay (FDA approved for NASAL specimens only), is one component of a comprehensive MRSA colonization surveillance program. It is not intended to diagnose MRSA infection nor to guide or monitor treatment for MRSA infections. Performed at [facility], [address], [place], [place] [address]        Today   Subjective    Bruce Barrera today has no new complaints -Ambulating around without dyspnea on exertion, no chest pain, no palpitations no dizziness O2 sat is 93 to 95% on room air with extensive ambulation around the unit          Patient has been seen and examined prior to discharge   Objective   Blood pressure (!) 127/91, pulse 68, temperature 98.2 F (36.8 C), temperature source Oral, resp. rate (!) 23, weight 113 kg, SpO2 93 %.   Intake/Output Summary (Last 24 hours) at 12/09/2019 1802 Last data filed at 12/09/2019 1605 Gross per 24 hour  Intake 593.49 ml  Output 6150 ml  Net -5556.51 ml    Exam Gen:- Awake Alert, no acute distress  HEENT:- .AT, No sclera icterus Neck-Supple Neck,No JVD,.  Lungs-much improved air movement, no wheezing CV- S1, S2 normal, regular Abd-  +ve B.Sounds, Abd Soft, No tenderness,    Extremity/Skin:- No  edema,   good pulses Psych-affect is appropriate, oriented x3 Neuro-no new focal deficits, no tremors    Data Review   CBC w Diff:  Lab Results  Component Value Date   WBC 9.7 12/09/2019   HGB 13.3 12/09/2019   HCT 41.1 12/09/2019   PLT 152 12/09/2019   LYMPHOPCT 16 10/10/2019   MONOPCT 9 10/10/2019   EOSPCT 0 10/10/2019    BASOPCT 0  10/10/2019    CMP:  Lab Results  Component Value Date   NA 135 12/09/2019   K 2.9 (L) 12/09/2019   CL 95 (L) 12/09/2019   CO2 32 12/09/2019   BUN 21 12/09/2019   CREATININE 1.03 12/09/2019   CREATININE 1.01 10/21/2019   PROT 7.6 12/08/2019   ALBUMIN 3.9 12/08/2019   BILITOT 0.6 12/08/2019   ALKPHOS 140 (H) 12/08/2019   AST 18 12/08/2019   ALT 17 12/08/2019  .   Total Discharge time is about 33 minutes  Shon Hale M.D on 12/09/2019 at 6:02 PM  Go to [email or web address] -  for contact info  [facility] - Office  [phone]

## 2019-12-15 NOTE — Procedures (Unsigned)
  [facility] Adra Shepler A. Gerilyn Pilgrim, MD     [email or web address]             NOCTURNAL POLYSOMNOGRAPHY   LOCATION: [facility]   Study terminated due to SOB and sent to ER.  Argie Ramming, MD Diplomate, American Board of Sleep Medicine. ELECTRONICALLY SIGNED ON:  12/15/2019, 8:59 PM [facility] PH: [phone]   FX: [phone] ACCREDITED BY THE AMERICAN ACADEMY OF SLEEP MEDICINE

## 2019-12-30 ENCOUNTER — Other Ambulatory Visit: Payer: Self-pay

## 2019-12-30 ENCOUNTER — Ambulatory Visit (INDEPENDENT_AMBULATORY_CARE_PROVIDER_SITE_OTHER): Payer: Medicare Other | Admitting: Student

## 2019-12-30 ENCOUNTER — Encounter: Payer: Self-pay | Admitting: Student

## 2019-12-30 VITALS — BP 140/76 | HR 88 | Temp 99.9°F | Ht 71.0 in | Wt 263.0 lb

## 2019-12-30 DIAGNOSIS — I2510 Atherosclerotic heart disease of native coronary artery without angina pectoris: Secondary | ICD-10-CM | POA: Diagnosis not present

## 2019-12-30 DIAGNOSIS — E782 Mixed hyperlipidemia: Secondary | ICD-10-CM

## 2019-12-30 DIAGNOSIS — I10 Essential (primary) hypertension: Secondary | ICD-10-CM

## 2019-12-30 DIAGNOSIS — I5032 Chronic diastolic (congestive) heart failure: Secondary | ICD-10-CM

## 2019-12-30 DIAGNOSIS — I713 Abdominal aortic aneurysm, ruptured, unspecified: Secondary | ICD-10-CM

## 2019-12-30 DIAGNOSIS — Z79899 Other long term (current) drug therapy: Secondary | ICD-10-CM | POA: Diagnosis not present

## 2019-12-30 MED ORDER — POTASSIUM CHLORIDE ER 20 MEQ PO TBCR: 20.0000 meq | EXTENDED_RELEASE_TABLET | Freq: Every day | ORAL | 1 refills | Status: DC

## 2019-12-30 MED ORDER — FUROSEMIDE 40 MG PO TABS: 60.0000 mg | ORAL_TABLET | Freq: Every day | ORAL | 3 refills | Status: DC

## 2019-12-30 NOTE — Patient Instructions (Signed)
Medication Instructions:  INCREASE Lasix to 60 mg ( 1 1/2 tablets) per day  *If you need a refill on your cardiac medications before your next appointment, please call your pharmacy*   Lab Work: BMET in 2 weeks   If you have labs (blood work) drawn today and your tests are completely normal, you will receive your results only by: Marland Kitchen MyChart Message (if you have MyChart) OR . A paper copy in the mail If you have any lab test that is abnormal or we need to change your treatment, we will call you to review the results.   Testing/Procedures: None today   Follow-Up: At [facility], you and your health needs are our priority.  As part of our continuing mission to provide you with exceptional heart care, we have created designated Provider Care Teams.  These Care Teams include your primary Cardiologist (physician) and Advanced Practice Providers (APPs -  Physician Assistants and Nurse Practitioners) who all work together to provide you with the care you need, when you need it.  We recommend signing up for the patient portal called "MyChart".  Sign up information is provided on this After Visit Summary.  MyChart is used to connect with patients for Virtual Visits (Telemedicine).  Patients are able to view lab/test results, encounter notes, upcoming appointments, etc.  Non-urgent messages can be sent to your provider as well.   To learn more about what you can do with MyChart, go to [email or web address].    Your next appointment:   2 month(s)  The format for your next appointment:   In Person  Provider:   Nona Dell, MD   Other Instructions None       Thank you for choosing [facility] !

## 2019-12-30 NOTE — Progress Notes (Signed)
Cardiology Office Note    Date:  12/30/2019   ID:  Lillian Tigges, [place] 1951-03-14, MRN [id number]  PCP:  Wilson Singer, MD  Cardiologist: Nona Dell, MD    Chief Complaint  Patient presents with  . Hospitalization Follow-up    History of Present Illness:    Bruce Barrera is a 69 y.o. male with past medical history of CAD (anterior MI in 2017 with cath showing occluded LAD and medical management recommended), history of ischemic cardiomyopathy (EF 30-35% in 03/2016, improved to 50-55% by echo in 09/2019), chronic diastolic CHF, COPD, HTN, HLD and ruptured AAA (s/p endograft repair in 08/2019) who presents to the office today for hospital follow-up.   He most recently presented to [facility] ED on 12/07/2019 for evaluation of worsening dyspnea on exertion and lower extremity edema after having been without his medications for several days.  He was found to have an acute CHF exacerbation and was also in hypertensive urgency upon admission and required IV nitroglycerin. He was started on IV Lasix and responded well with weight having declined to 249 lbs at the time of discharge.  He was transitioned back to Lasix 40 mg daily and continued on Coreg 6.25mg  BID and Entresto 24-26mg  BID.  In talking with the patient today, he reports still having baseline dyspnea on exertion which occurs with walking from room to room in his home. He does have COPD and reports having significant coughing and wheezing in the morning hours which improves after 20 to 30 minutes. His weight was at 255 lbs upon returning home at hospital discharge and has increased to 260 lbs on most recent check. At 263 lbs on the office scales today. He denies any orthopnea but does report intermittent PND. No recent exertional chest pain or palpitations. He does report some episodes of discomfort when feeling like he cannot catch his breath.  Past Medical History:  Diagnosis Date  . AAA (abdominal aortic aneurysm)  (HCC)   . Arthritis    Hips and knees  . Back pain   . Chronic systolic CHF (congestive heart failure) (HCC)   . Coronary artery disease    a. 03/2016 subacute anterior MI -> 100% prox LAD >72 hours out thus treated medically, LVEF 30-35% initially  . Essential hypertension   . GERD (gastroesophageal reflux disease)   . Hemorrhoids   . History of stroke    States "stroke" with left sided weakness 5/95 in prison in [place]  . Hyperlipidemia   . Hypothyroidism   . Ischemic cardiomyopathy   . MI (myocardial infarction) (HCC)    States "heart attack" 2/95 in prison in [place]  . Obesity   . Peptic ulcer    GIB 1994  . Sleep apnea   . Vitamin D deficiency disease 06/19/2019    Past Surgical History:  Procedure Laterality Date  . ABDOMINAL AORTIC ENDOVASCULAR STENT GRAFT N/A 09/09/2019   Procedure: ABDOMINAL AORTIC ENDOVASCULAR STENT GRAFT;  Surgeon: Maeola Harman, MD;  Location: [facility] OR;  Service: Vascular;  Laterality: N/A;  . ADJUSTABLE SUTURE MANIPULATION Left 04/10/2018   Procedure: ADJUSTABLE SUTURE MANIPULATION;  Surgeon: Aura Camps, MD;  Location: [facility] OR;  Service: Ophthalmology;  Laterality: Left;  . CARDIAC CATHETERIZATION N/A 03/15/2016   Procedure: Right/Left Heart Cath and Coronary Angiography;  Surgeon: Iran Ouch, MD;  Location: [facility] INVASIVE CV LAB;  Service: Cardiovascular;  Laterality: N/A;  . COLONOSCOPY  03/23/2011   hemorrhoids  . KNEE ARTHROSCOPY  Right knee x 2  . MEDIAN RECTUS REPAIR Left 04/10/2018   Procedure: LEFT MEDIAN RECTUS REPAIR;  Surgeon: Aura Camps, MD;  Location: [facility] OR;  Service: Ophthalmology;  Laterality: Left;  Marland Kitchen MUSCLE RECESSION AND RESECTION Left 04/10/2018   Procedure: LEFT MUSCLE RECESSION;  Surgeon: Aura Camps, MD;  Location: [facility] OR;  Service: Ophthalmology;  Laterality: Left;  . TOTAL HIP ARTHROPLASTY Right 2014    Current Medications: Outpatient Medications Prior to Visit  Medication Sig Dispense Refill  .  acetaminophen (TYLENOL) 325 MG tablet Take 2 tablets (650 mg total) by mouth every 6 (six) hours as needed for mild pain, fever or headache (or Fever >/= 101). 30 tablet 2  . aspirin 81 MG tablet Take 1 tablet (81 mg total) by mouth daily with breakfast. 30 tablet 2  . atorvastatin (LIPITOR) 80 MG tablet Take 1 tablet (80 mg total) by mouth daily. 30 tablet 0  . budesonide-formoterol (SYMBICORT) 80-4.5 MCG/ACT inhaler Inhale 2 puffs into the lungs 2 (two) times daily. 1 Inhaler 12  . carvedilol (COREG) 6.25 MG tablet Take 1 tablet (6.25 mg total) by mouth 2 (two) times daily with a meal. 60 tablet 3  . clopidogrel (PLAVIX) 75 MG tablet Take 1 tablet (75 mg total) by mouth daily. 30 tablet 5  . fish oil-omega-3 fatty acids 1000 MG capsule Take 2 g by mouth daily.    Marland Kitchen levothyroxine (SYNTHROID) 100 MCG tablet Take 1 tablet (100 mcg total) by mouth daily before breakfast. 30 tablet 0  . pantoprazole (PROTONIX) 40 MG tablet TAKE (1) TABLET BY MOUTH AT BEDTIME. 30 tablet 2  . sacubitril-valsartan (ENTRESTO) 24-26 MG Take 1 tablet by mouth 2 (two) times daily. 60 tablet 0  . furosemide (LASIX) 40 MG tablet Take 1 tablet (40 mg total) by mouth daily. 30 tablet 2  . Potassium Chloride ER 20 MEQ TBCR Take 20 mEq by mouth every Monday, Wednesday, and Friday. 1 tab daily by mouth 30 tablet 1   No facility-administered medications prior to visit.     Allergies:   Patient has no known allergies.   Social History   Socioeconomic History  . Marital status: Divorced    Spouse name: Not on file  . Number of children: Not on file  . Years of education: Not on file  . Highest education level: Not on file  Occupational History  . Occupation: Disabled    Comment: Carpenter  Tobacco Use  . Smoking status: Current Every Day Smoker    Packs/day: 1.00    Types: Cigarettes    Start date: 05/18/1961  . Smokeless tobacco: Never Used  . Tobacco comment: smokes pack a day  Substance and Sexual Activity  .  Alcohol use: No  . Drug use: No  . Sexual activity: Not on file  Other Topics Concern  . Not on file  Social History Narrative   Lives with girlfriend for last 8 years.On disability .   Social Determinants of Health   Financial Resource Strain:   . Difficulty of Paying Living Expenses:   Food Insecurity:   . Worried About Programme researcher, broadcasting/film/video in the Last Year:   . Barista in the Last Year:   Transportation Needs:   . Freight forwarder (Medical):   Marland Kitchen Lack of Transportation (Non-Medical):   Physical Activity:   . Days of Exercise per Week:   . Minutes of Exercise per Session:   Stress:   . Feeling of Stress :  Social Connections:   . Frequency of Communication with Friends and Family:   . Frequency of Social Gatherings with Friends and Family:   . Attends Religious Services:   . Active Member of Clubs or Organizations:   . Attends BankerClub or Organization Meetings:   Marland Kitchen. Marital Status:      Family History:  The patient's family history includes Aneurysm in his brother; Cirrhosis in his brother; Diabetes in his mother; Heart attack in his brother; Heart disease in his mother; Hypertension in his mother; Stroke in his father and mother.   Review of Systems:   Please see the history of present illness.     General:  No chills, fever, night sweats or weight changes.  Cardiovascular:  No chest pain, edema, orthopnea, palpitations, paroxysmal nocturnal dyspnea. Positive for dyspnea on exertion.  Dermatological: No rash, lesions/masses Respiratory: No cough, dyspnea Urologic: No hematuria, dysuria Abdominal:   No nausea, vomiting, diarrhea, bright red blood per rectum, melena, or hematemesis Neurologic:  No visual changes, wkns, changes in mental status. All other systems reviewed and are otherwise negative except as noted above.   Physical Exam:    VS:  BP 140/76   Pulse 88   Temp 99.9 F (37.7 C)   Ht 5\' 11"  (1.803 m)   Wt 263 lb (119.3 kg)   SpO2 95%   BMI  36.68 kg/m    General: Well developed, well nourished,male appearing in no acute distress. Head: Normocephalic, atraumatic, sclera non-icteric.  Neck: No carotid bruits. JVD not elevated.  Lungs: Respirations regular and unlabored, without wheezes or rales.  Heart: Regular rate and rhythm. No S3 or S4.  No murmur, no rubs, or gallops appreciated. Abdomen: Soft, non-tender, non-distended. No obvious abdominal masses. Msk:  Strength and tone appear normal for age. No obvious joint deformities or effusions. Extremities: No clubbing or cyanosis. 1+ pitting edema up to mid-shins bilaterally.  Distal pedal pulses are 2+ bilaterally. Neuro: Alert and oriented X 3. Moves all extremities spontaneously. No focal deficits noted. Psych:  Responds to questions appropriately with a normal affect. Skin: No rashes or lesions noted  Wt Readings from Last 3 Encounters:  12/30/19 263 lb (119.3 kg)  12/09/19 249 lb 1.9 oz (113 kg)  11/20/19 258 lb 8 oz (117.3 kg)      Studies/Labs Reviewed:   EKG:  EKG is not ordered today.   Recent Labs: 12/08/2019: ALT 17; B Natriuretic Peptide 994.0; Magnesium 2.0; TSH 3.816 12/09/2019: BUN 21; Creatinine, Ser 1.03; Hemoglobin 13.3; Platelets 152; Potassium 2.9; Sodium 135   Lipid Panel    Component Value Date/Time   CHOL 136 06/19/2019 1522   TRIG 293 (H) 10/08/2019 1502   HDL 27 (L) 06/19/2019 1522   CHOLHDL 5.0 (H) 06/19/2019 1522   LDLCALC 75 06/19/2019 1522    Additional studies/ records that were reviewed today include:   Echocardiogram: 09/2019 IMPRESSIONS    1. Left ventricular ejection fraction, by visual estimation, is 50 to  55%. The left ventricle has mildly decreased function. Left ventricular  septal wall thickness was mildly increased. Mildly increased left  ventricular posterior wall thickness. There  is mildly increased left ventricular hypertrophy.  2. Definity contrast agent was given IV to delineate the left ventricular   endocardial borders.  3. Elevated left ventricular end-diastolic pressure.  4. The left ventricle demonstrates regional wall motion abnormalities.  5. Apical anterior apical septal, and apical hypokinesis.  6. Global right ventricle has normal systolic function.The right  ventricular size  is normal. No increase in right ventricular wall  thickness.  7. Left atrial size was normal.  8. Right atrial size was normal.  9. The mitral valve is normal in structure. Trivial mitral valve  regurgitation. No evidence of mitral stenosis.  10. The tricuspid valve is normal in structure.  11. The tricuspid valve is normal in structure. Tricuspid valve  regurgitation is trivial.  12. The aortic valve is normal in structure. Aortic valve regurgitation is  not visualized. No evidence of aortic valve sclerosis or stenosis.  13. The pulmonic valve was normal in structure. Pulmonic valve  regurgitation is not visualized.  14. Normal pulmonary artery systolic pressure.  15. The inferior vena cava is dilated in size with >50% respiratory  variability, suggesting right atrial pressure of 8 mmHg.   Assessment:    1. Chronic diastolic (congestive) heart failure ([place])   2. Medication management   3. Coronary artery disease involving native coronary artery of native heart without angina pectoris   4. Essential hypertension   5. Mixed hyperlipidemia   6. Ruptured abdominal aortic aneurysm ([place])      Plan:   In order of problems listed above:  1. Chronic Diastolic CHF - he has a history of ischemic cardiomyopathy with EF reduced to 30-35% in 2017, improved to 50-55% by most recent imaging earlier this year. Recently admitted for a CHF exacerbation as outlined above and weight has increased by at least 5 lbs since hospital discharge on his home scales. He has baseline dyspnea on exertion in the setting of COPD but also has pitting edema on examination as well.  - he is currently on Lasix 40mg  daily  and will plan to titrate to 60mg  daily. Repeat BMET in 2 weeks. Continue Coreg and Entresto at current dosing for now.   2. CAD -  He is s/p anterior MI in 2017 with cath showing occluded LAD and medical management was recommended a that time. He does describe pleuritic pain when coughing and having difficulty catching his breath but denies any acute changes in this.  - remains on ASA, statin and BB therapy.   3. HTN - BP is elevated at 140/76 during today's visit. I encouraged him to follow readings at home following dose titration of Lasix. Can further titrate Coreg or Entresto if additional BP control is needed in the future.   4. HLD - followed by PCP. Goal LDL is less than 70 with known CAD. He remains on Atorvastatin 80mg  daily.   5. Ruptured AAA - s/p endograft repair in 08/2019. Followed by Vascular Surgery.  Medication Adjustments/Labs and Tests Ordered: Current medicines are reviewed at length with the patient today.  Concerns regarding medicines are outlined above.  Medication changes, Labs and Tests ordered today are listed in the Patient Instructions below. Patient Instructions  Medication Instructions:  INCREASE Lasix to 60 mg ( 1 1/2 tablets) per day  *If you need a refill on your cardiac medications before your next appointment, please call your pharmacy*   Lab Work: BMET in 2 weeks   If you have labs (blood work) drawn today and your tests are completely normal, you will receive your results only by: Marland Kitchen MyChart Message (if you have MyChart) OR . A paper copy in the mail If you have any lab test that is abnormal or we need to change your treatment, we will call you to review the results.   Testing/Procedures: None today   Follow-Up: At [facility], you and your  health needs are our priority.  As part of our continuing mission to provide you with exceptional heart care, we have created designated Provider Care Teams.  These Care Teams include your primary  Cardiologist (physician) and Advanced Practice Providers (APPs -  Physician Assistants and Nurse Practitioners) who all work together to provide you with the care you need, when you need it.  We recommend signing up for the patient portal called "MyChart".  Sign up information is provided on this After Visit Summary.  MyChart is used to connect with patients for Virtual Visits (Telemedicine).  Patients are able to view lab/test results, encounter notes, upcoming appointments, etc.  Non-urgent messages can be sent to your provider as well.   To learn more about what you can do with MyChart, go to [email or web address].    Your next appointment:   2 month(s)  The format for your next appointment:   In Person  Provider:   Nona Dell, MD   Other Instructions None       Thank you for choosing [facility] !            Signed, Ellsworth Lennox, PA-C  12/30/2019 7:45 PM    [facility] [address] [place], [place] [address] Phone: [phone] Fax: [phone]

## 2020-01-02 ENCOUNTER — Other Ambulatory Visit: Payer: Self-pay

## 2020-01-02 ENCOUNTER — Other Ambulatory Visit (HOSPITAL_COMMUNITY)
Admission: RE | Admit: 2020-01-02 | Discharge: 2020-01-02 | Disposition: A | Discharge status: Home / Self Care | Payer: Medicare Other | Source: Ambulatory Visit | Attending: Neurology | Admitting: Neurology

## 2020-01-02 DIAGNOSIS — Z01812 Encounter for preprocedural laboratory examination: Secondary | ICD-10-CM | POA: Insufficient documentation

## 2020-01-02 DIAGNOSIS — Z20822 Contact with and (suspected) exposure to covid-19: Secondary | ICD-10-CM | POA: Diagnosis not present

## 2020-01-03 LAB — SARS CORONAVIRUS 2 (TAT 6-24 HRS): SARS Coronavirus 2: NEGATIVE

## 2020-01-04 ENCOUNTER — Ambulatory Visit: Payer: Medicare Other | Attending: Neurology | Admitting: Neurology

## 2020-01-04 ENCOUNTER — Other Ambulatory Visit: Payer: Self-pay

## 2020-01-04 DIAGNOSIS — I5022 Chronic systolic (congestive) heart failure: Secondary | ICD-10-CM | POA: Diagnosis not present

## 2020-01-04 DIAGNOSIS — Z79899 Other long term (current) drug therapy: Secondary | ICD-10-CM | POA: Insufficient documentation

## 2020-01-04 DIAGNOSIS — Z7982 Long term (current) use of aspirin: Secondary | ICD-10-CM | POA: Insufficient documentation

## 2020-01-04 DIAGNOSIS — E669 Obesity, unspecified: Secondary | ICD-10-CM | POA: Diagnosis not present

## 2020-01-04 DIAGNOSIS — Z6830 Body mass index (BMI) 30.0-30.9, adult: Secondary | ICD-10-CM | POA: Insufficient documentation

## 2020-01-04 DIAGNOSIS — Z7951 Long term (current) use of inhaled steroids: Secondary | ICD-10-CM | POA: Insufficient documentation

## 2020-01-04 DIAGNOSIS — Z8673 Personal history of transient ischemic attack (TIA), and cerebral infarction without residual deficits: Secondary | ICD-10-CM | POA: Insufficient documentation

## 2020-01-04 DIAGNOSIS — G4733 Obstructive sleep apnea (adult) (pediatric): Secondary | ICD-10-CM

## 2020-01-05 ENCOUNTER — Other Ambulatory Visit (INDEPENDENT_AMBULATORY_CARE_PROVIDER_SITE_OTHER): Payer: Self-pay | Admitting: Internal Medicine

## 2020-01-13 ENCOUNTER — Other Ambulatory Visit (HOSPITAL_COMMUNITY)
Admission: RE | Admit: 2020-01-13 | Discharge: 2020-01-13 | Disposition: A | Discharge status: Home / Self Care | Payer: Medicare Other | Source: Ambulatory Visit | Attending: Student | Admitting: Student

## 2020-01-13 DIAGNOSIS — Z79899 Other long term (current) drug therapy: Secondary | ICD-10-CM | POA: Diagnosis not present

## 2020-01-13 LAB — BASIC METABOLIC PANEL
Anion gap: 11 (ref 5–15)
BUN: 23 mg/dL (ref 8–23)
CO2: 28 mmol/L (ref 22–32)
Calcium: 8.9 mg/dL (ref 8.9–10.3)
Chloride: 99 mmol/L (ref 98–111)
Creatinine, Ser: 1.01 mg/dL (ref 0.61–1.24)
GFR calc Af Amer: >60 mL/min (ref >60)
GFR calc non Af Amer: >60 mL/min (ref >60)
Glucose, Bld: 127 mg/dL — ABNORMAL HIGH (ref 70–99)
Potassium: 3.4 mmol/L — ABNORMAL LOW (ref 3.5–5.1)
Sodium: 138 mmol/L (ref 135–145)

## 2020-01-15 ENCOUNTER — Telehealth: Payer: Self-pay | Admitting: *Deleted

## 2020-01-15 DIAGNOSIS — Z79899 Other long term (current) drug therapy: Secondary | ICD-10-CM

## 2020-01-15 MED ORDER — POTASSIUM CHLORIDE CRYS ER 20 MEQ PO TBCR: 20.0000 meq | EXTENDED_RELEASE_TABLET | Freq: Every day | ORAL | 3 refills | Status: DC

## 2020-01-15 NOTE — Procedures (Signed)
[place] A. Merlene Laughter, MD     [email or web address]             NOCTURNAL POLYSOMNOGRAPHY   LOCATION: [facility]  Patient Name: Bruce Barrera, Bruce Barrera Date: 01/04/2020 Gender: Male D.O.B: 04-01-1951 Age (years): 72 Referring Provider: Star Age MD Height (inches): 71 Interpreting Physician: Phillips Odor MD, ABSM Weight (lbs): 263 RPSGT: Rosebud Poles BMI: 37 MRN: [id number] Neck Size: 18.50 CLINICAL INFORMATION Sleep Study Type: NPSG     Indication for sleep study: N/A     Epworth Sleepiness Score: 4      Most recent polysomnogram dated 12/07/2019 revealed RDI of 0.0/h. SLEEP STUDY TECHNIQUE As per the AASM Manual for the Scoring of Sleep and Associated Events v2.3 (April 2016) with a hypopnea requiring 4% desaturations.  The channels recorded and monitored were frontal, central and occipital EEG, electrooculogram (EOG), submentalis EMG (chin), nasal and oral airflow, thoracic and abdominal wall motion, anterior tibialis EMG, snore microphone, electrocardiogram, and pulse oximetry.  MEDICATIONS Medications self-administered by patient taken the night of the study : N/A  Current Outpatient Medications:  .  acetaminophen (TYLENOL) 325 MG tablet, Take 2 tablets (650 mg total) by mouth every 6 (six) hours as needed for mild pain, fever or headache (or Fever >/= 101)., Disp: 30 tablet, Rfl: 2 .  aspirin 81 MG tablet, Take 1 tablet (81 mg total) by mouth daily with breakfast., Disp: 30 tablet, Rfl: 2 .  atorvastatin (LIPITOR) 80 MG tablet, Take 1 tablet (80 mg total) by mouth daily., Disp: 30 tablet, Rfl: 0 .  budesonide-formoterol (SYMBICORT) 80-4.5 MCG/ACT inhaler, Inhale 2 puffs into the lungs 2 (two) times daily., Disp: 1 Inhaler, Rfl: 12 .  carvedilol (COREG) 6.25 MG tablet, Take 1 tablet (6.25 mg total) by mouth 2 (two) times daily with a meal., Disp: 60 tablet, Rfl: 3 .  clopidogrel (PLAVIX) 75 MG tablet, Take 1 tablet (75 mg total) by  mouth daily., Disp: 30 tablet, Rfl: 5 .  fish oil-omega-3 fatty acids 1000 MG capsule, Take 2 g by mouth daily., Disp: , Rfl:  .  furosemide (LASIX) 40 MG tablet, Take 1.5 tablets (60 mg total) by mouth daily., Disp: 135 tablet, Rfl: 3 .  levothyroxine (SYNTHROID) 100 MCG tablet, Take 1 tablet (100 mcg total) by mouth daily before breakfast., Disp: 30 tablet, Rfl: 0 .  pantoprazole (PROTONIX) 40 MG tablet, TAKE (1) TABLET BY MOUTH AT BEDTIME., Disp: 30 tablet, Rfl: 2 .  Potassium Chloride ER 20 MEQ TBCR, Take 20 mEq by mouth daily., Disp: 90 tablet, Rfl: 1 .  potassium chloride SA (KLOR-CON) 20 MEQ tablet, Take 1 tablet (20 mEq total) by mouth daily., Disp: 90 tablet, Rfl: 3 .  sacubitril-valsartan (ENTRESTO) 24-26 MG, Take 1 tablet by mouth 2 (two) times daily., Disp: 60 tablet, Rfl: 0     SLEEP ARCHITECTURE The study was initiated at 10:41:22 PM and ended at 5:09:18 AM.  Sleep onset time was 15.7 minutes and the sleep efficiency was 85.8%%. The total sleep time was 332.7 minutes.  Stage REM latency was 90.0 minutes.  The patient spent 6.5%% of the night in stage N1 sleep, 66.9%% in stage N2 sleep, 15.9%% in stage N3 and 10.7% in REM.  Alpha intrusion was absent.  Supine sleep was 0.00%.  RESPIRATORY PARAMETERS The overall apnea/hypopnea index (AHI) was 3.1 per hour. There were 0 total apneas, including 0 obstructive, 0 central and 0 mixed apneas. There were 17 hypopneas and 0 RERAs.  The AHI during Stage  REM sleep was 22.0 per hour.  AHI while supine was N/A per hour.  The mean oxygen saturation was 87.8%. The minimum SpO2 during sleep was 77.0%.  none snoring was noted during this study.  CARDIAC DATA The 2 lead EKG demonstrated sinus rhythm. The mean heart rate was 84.4 beats per minute. Other EKG findings include: None.  LEG MOVEMENT DATA The total PLMS were 0 with a resulting PLMS index of 0.0. Associated arousal with leg movement index was 0.0.  IMPRESSIONS No  significant obstructive sleep apnea occurred during this study. No significant central sleep apnea occurred during this study.      Argie Ramming, MD Diplomate, American Board of Sleep Medicine.  ELECTRONICALLY SIGNED ON:  01/15/2020, 2:28 PM [facility] PH: [phone]   FX: [phone] ACCREDITED BY THE AMERICAN ACADEMY OF SLEEP MEDICINE

## 2020-01-15 NOTE — Telephone Encounter (Signed)
-----   Message from Ellsworth Lennox, [place] sent at 01/14/2020 11:22 AM EDT ----- Please let the patient know his kidney function has remained stable but potassium is still low. He is listed as taking  K-dur 20 mEq every Monday, Wednesday, and Friday. I would recommend he take 20 mEq on a daily basis. Repeat BMET in 3-4 weeks for reassessment after making dose adjustments.

## 2020-01-19 ENCOUNTER — Telehealth (INDEPENDENT_AMBULATORY_CARE_PROVIDER_SITE_OTHER): Payer: Self-pay

## 2020-01-19 ENCOUNTER — Ambulatory Visit (INDEPENDENT_AMBULATORY_CARE_PROVIDER_SITE_OTHER): Payer: Medicare Other | Admitting: Nurse Practitioner

## 2020-01-19 NOTE — Telephone Encounter (Signed)
Called to cancel appt; pt is on way to ER. He feels like he is having another heart attack.

## 2020-02-03 ENCOUNTER — Other Ambulatory Visit: Payer: Self-pay

## 2020-02-03 ENCOUNTER — Ambulatory Visit (INDEPENDENT_AMBULATORY_CARE_PROVIDER_SITE_OTHER): Payer: Medicare Other | Admitting: Nurse Practitioner

## 2020-02-03 ENCOUNTER — Encounter (INDEPENDENT_AMBULATORY_CARE_PROVIDER_SITE_OTHER): Payer: Self-pay | Admitting: Nurse Practitioner

## 2020-02-03 VITALS — BP 150/84 | HR 84 | Temp 98.1°F | Ht 71.0 in | Wt 255.0 lb

## 2020-02-03 DIAGNOSIS — E559 Vitamin D deficiency, unspecified: Secondary | ICD-10-CM

## 2020-02-03 DIAGNOSIS — E039 Hypothyroidism, unspecified: Secondary | ICD-10-CM | POA: Diagnosis not present

## 2020-02-03 DIAGNOSIS — I10 Essential (primary) hypertension: Secondary | ICD-10-CM | POA: Diagnosis not present

## 2020-02-03 DIAGNOSIS — Z23 Encounter for immunization: Secondary | ICD-10-CM | POA: Diagnosis not present

## 2020-02-03 DIAGNOSIS — J449 Chronic obstructive pulmonary disease, unspecified: Secondary | ICD-10-CM | POA: Diagnosis not present

## 2020-02-03 DIAGNOSIS — E782 Mixed hyperlipidemia: Secondary | ICD-10-CM | POA: Diagnosis not present

## 2020-02-03 DIAGNOSIS — I5032 Chronic diastolic (congestive) heart failure: Secondary | ICD-10-CM

## 2020-02-03 MED ORDER — BUDESONIDE-FORMOTEROL FUMARATE 80-4.5 MCG/ACT IN AERO: 2.0000 | INHALATION_SPRAY | Freq: Two times a day (BID) | RESPIRATORY_TRACT | 3 refills | Status: DC

## 2020-02-03 MED ORDER — ALBUTEROL SULFATE HFA 108 (90 BASE) MCG/ACT IN AERS: 2.0000 | INHALATION_SPRAY | Freq: Four times a day (QID) | RESPIRATORY_TRACT | 3 refills | Status: DC | PRN

## 2020-02-03 MED ORDER — CARVEDILOL 12.5 MG PO TABS: 12.5000 mg | ORAL_TABLET | Freq: Two times a day (BID) | ORAL | 0 refills | Status: DC

## 2020-02-03 NOTE — Patient Instructions (Signed)
Take your SYMBICORT INHALER 2 puffs into the lungs twice a day every day.  Take your ALBUTEROL INHALER 2 puffs into the lungs every 6 hours ONLY AS NEEDED for shortness of breath or wheezing

## 2020-02-03 NOTE — Progress Notes (Signed)
Subjective:  Patient ID: Bruce Barrera, male    DOB: 08/11/1951  Age: 69 y.o. MRN: [id number]  CC:  Chief Complaint  Patient presents with  . Hypertension  . Medication Refill    symbicort inhaler  . COPD  . Congestive Heart Failure  . Hypothyroidism  . Hyperlipidemia  . Other    Vitamin D deficiency      HPI  This patient arrives today for the above.  Hypertension: Continues on carvedilol 6.25 mg twice a day, furosemide 40 mg, potassium supplement 20 mEq, Entresto twice daily.  He is tolerating these medications well.  COPD: He is on his Symbicort inhaler that he takes only as needed.  He feels that his breathing is at baseline currently.  He has had the COVID-19 vaccinations, it does not appear that he has had pneumonia vaccine.  He does not recall having had one administered.  He does not spend time around unvaccinated children.  CHF: He had hospitalization for CHF exacerbation back in March of this year.  He is being followed by cardiology.  Since discharge he has followed up with them and Lasix has been increased from 40 mg daily to 60 mg daily.    Hypothyroidism: He continues on levothyroxine 100 mcg daily.  Last TSH was collected in March 2021 it was 3.8  Hyperlipidemia: He does have a history of hyperlipidemia and continues on aspirin 81 mg, Lipitor 80 mg, Plavix 85 mg, and omega-3 2 g daily.  Last of the panel showed LDL 75.  Vitamin D deficiency: He has a history of vitamin D deficiency, but I do not see that he is on any medication for this at this time currently.   Past Medical History:  Diagnosis Date  . AAA (abdominal aortic aneurysm) ([place])   . Arthritis    Hips and knees  . Back pain   . Chronic systolic CHF (congestive heart failure) ([place])   . Coronary artery disease    a. 03/2016 subacute anterior MI -> 100% prox LAD >72 hours out thus treated medically, LVEF 30-35% initially  . Essential hypertension   . GERD (gastroesophageal reflux disease)    . Hemorrhoids   . History of stroke    States "stroke" with left sided weakness 5/95 in prison in [place]  . Hyperlipidemia   . Hypothyroidism   . Ischemic cardiomyopathy   . MI (myocardial infarction) ([place])    States "heart attack" 2/95 in prison in [place]  . Obesity   . Peptic ulcer    GIB 1994  . Sleep apnea   . Vitamin D deficiency disease 06/19/2019      Family History  Problem Relation Age of Onset  . Hypertension Mother   . Diabetes Mother   . Heart disease Mother   . Stroke Mother   . Stroke Father   . Heart attack Brother   . Aneurysm Brother   . Cirrhosis Brother     Social History   Social History Narrative   Lives with girlfriend for last 8 years.On disability .   Social History   Tobacco Use  . Smoking status: Current Every Day Smoker    Packs/day: 1.00    Types: Cigarettes    Start date: 05/18/1961  . Smokeless tobacco: Never Used  . Tobacco comment: smokes pack a day  Substance Use Topics  . Alcohol use: No     Current Meds  Medication Sig  . aspirin 81 MG tablet Take 1 tablet (  81 mg total) by mouth daily with breakfast.  . atorvastatin (LIPITOR) 80 MG tablet Take 1 tablet (80 mg total) by mouth daily.  . budesonide-formoterol (SYMBICORT) 80-4.5 MCG/ACT inhaler Inhale 2 puffs into the lungs 2 (two) times daily.  . carvedilol (COREG) 12.5 MG tablet Take 1 tablet (12.5 mg total) by mouth 2 (two) times daily with a meal.  . clopidogrel (PLAVIX) 75 MG tablet Take 1 tablet (75 mg total) by mouth daily.  . furosemide (LASIX) 40 MG tablet Take 1.5 tablets (60 mg total) by mouth daily.  Marland Kitchen levothyroxine (SYNTHROID) 100 MCG tablet Take 1 tablet (100 mcg total) by mouth daily before breakfast.  . pantoprazole (PROTONIX) 40 MG tablet TAKE (1) TABLET BY MOUTH AT BEDTIME.  Marland Kitchen Potassium Chloride ER 20 MEQ TBCR Take 20 mEq by mouth daily.  . sacubitril-valsartan (ENTRESTO) 24-26 MG Take 1 tablet by mouth 2 (two) times daily.  . [DISCONTINUED] budesonide-formoterol  (SYMBICORT) 80-4.5 MCG/ACT inhaler Inhale 2 puffs into the lungs 2 (two) times daily.  . [DISCONTINUED] carvedilol (COREG) 6.25 MG tablet Take 1 tablet (6.25 mg total) by mouth 2 (two) times daily with a meal.    ROS:  Review of Systems  Constitutional: Negative for fever, malaise/fatigue and weight loss.  Eyes: Negative for blurred vision and double vision.  Respiratory: Positive for shortness of breath. Negative for cough and wheezing.   Cardiovascular: Negative for chest pain and palpitations.  Neurological: Negative for dizziness and headaches.     Objective:   Today's Vitals: BP (!) 150/84   Pulse 84   Temp 98.1 F (36.7 C) (Temporal)   Ht '5\' 11"'  (1.803 m)   Wt 255 lb (115.7 kg)   SpO2 90%   BMI 35.57 kg/m  Vitals with BMI 02/03/2020 02/03/2020 12/30/2019  Height - '5\' 11"'  '5\' 11"'   Weight - 255 lbs 263 lbs  BMI - 63.14 97.0  Systolic 263 785 885  Diastolic 84 90 76  Pulse - 84 88     Physical Exam Vitals reviewed.  Constitutional:      Appearance: Normal appearance.  HENT:     Head: Normocephalic and atraumatic.  Cardiovascular:     Rate and Rhythm: Normal rate and regular rhythm.  Pulmonary:     Effort: Pulmonary effort is normal.     Breath sounds: Normal breath sounds.  Musculoskeletal:     Cervical back: Neck supple.  Skin:    General: Skin is warm and dry.  Neurological:     Mental Status: He is alert and oriented to person, place, and time.  Psychiatric:        Mood and Affect: Mood normal.        Behavior: Behavior normal.        Thought Content: Thought content normal.        Judgment: Judgment normal.          Assessment and Plan   1. Chronic obstructive pulmonary disease, unspecified COPD type ([place])   2. Immunization due   3. Hypothyroidism, adult   4. Essential hypertension, benign   5. Vitamin D deficiency disease   6. Mixed hyperlipidemia   7. Chronic diastolic heart failure ([place])      Plan: 1., 2.  I recommend that he take his  Symbicort daily as maintenance medication, and will prescribe him albuterol that he can take as needed.  I emphasized the importance of using his Symbicort as maintenance only and his albuterol for rescue medication only as well.  We will also refer him to pulmonology for further assistance in managing his COPD.  I will have his PPSV23 vaccine administered today as well.  3.  I will collect blood work today for further evaluation.  4.  Blood pressure was quite elevated today, I did consult with Dr. Anastasio Champion regarding this.  We will increase his carvedilol to 12.5 mg twice a day.  I did tell the patient if he feels unwell with this medication dose change that he should call this office, he tells me he understands.  5.  I will check his serum vitamin D level today for further evaluation.  6.  I will collect flu panel today for further evaluation.  7.  He will continue to take his current medication regimen, and follow-up with his cardiologist as scheduled.   Tests ordered Orders Placed This Encounter  Procedures  . Pneumococcal polysaccharide vaccine 23-valent greater than or equal to 2yo subcutaneous/IM  . CMP with eGFR([facility])  . TSH  . Vitamin D, 25-hydroxy  . Lipid Panel  . Ambulatory referral to Pulmonology      Meds ordered this encounter  Medications  . budesonide-formoterol (SYMBICORT) 80-4.5 MCG/ACT inhaler    Sig: Inhale 2 puffs into the lungs 2 (two) times daily.    Dispense:  1 Inhaler    Refill:  3    Order Specific Question:   Supervising Provider    Answer:   Anastasio Champion, NIMISH C [[id number]]  . albuterol (VENTOLIN HFA) 108 (90 Base) MCG/ACT inhaler    Sig: Inhale 2 puffs into the lungs every 6 (six) hours as needed for wheezing or shortness of breath.    Dispense:  8 g    Refill:  3    Order Specific Question:   Supervising Provider    Answer:   Anastasio Champion, NIMISH C [[id number]]  . carvedilol (COREG) 12.5 MG tablet    Sig: Take 1 tablet (12.5 mg total) by mouth 2 (two) times daily  with a meal.    Dispense:  180 tablet    Refill:  0    Order Specific Question:   Supervising Provider    Answer:   Doree Albee [[id number]]    Patient to follow-up in 1 month for blood pressure recheck  Ailene Ards, NP

## 2020-02-04 LAB — COMPLETE METABOLIC PANEL WITH GFR
AG Ratio: 1.4 (calc) (ref 1.0–2.5)
ALT: 11 U/L (ref 9–46)
AST: 15 U/L (ref 10–35)
Albumin: 3.8 g/dL (ref 3.6–5.1)
Alkaline phosphatase (APISO): 135 U/L (ref 35–144)
BUN: 19 mg/dL (ref 7–25)
CO2: 32 mmol/L (ref 20–32)
Calcium: 9 mg/dL (ref 8.6–10.3)
Chloride: 99 mmol/L (ref 98–110)
Creat: 1.14 mg/dL (ref 0.70–1.25)
GFR, Est African American: 76 mL/min/{1.73_m2} (ref >=60)
GFR, Est Non African American: 66 mL/min/{1.73_m2} (ref >=60)
Globulin: 2.8 g/dL (calc) (ref 1.9–3.7)
Glucose, Bld: 116 mg/dL — ABNORMAL HIGH (ref 65–99)
Potassium: 3.8 mmol/L (ref 3.5–5.3)
Sodium: 138 mmol/L (ref 135–146)
Total Bilirubin: 0.8 mg/dL (ref 0.2–1.2)
Total Protein: 6.6 g/dL (ref 6.1–8.1)

## 2020-02-04 LAB — LIPID PANEL
Cholesterol: 129 mg/dL (ref <200)
HDL: 34 mg/dL — ABNORMAL LOW (ref >=40)
LDL Cholesterol (Calc): 76 mg/dL (calc)
Non-HDL Cholesterol (Calc): 95 mg/dL (calc) (ref <130)
Total CHOL/HDL Ratio: 3.8 (calc) (ref <5.0)
Triglycerides: 111 mg/dL (ref <150)

## 2020-02-04 LAB — VITAMIN D 25 HYDROXY (VIT D DEFICIENCY, FRACTURES): Vit D, 25-Hydroxy: 14 ng/mL — ABNORMAL LOW (ref 30–100)

## 2020-02-04 LAB — TSH: TSH: 2.52 mIU/L (ref 0.40–4.50)

## 2020-02-05 ENCOUNTER — Other Ambulatory Visit: Payer: Self-pay

## 2020-02-05 ENCOUNTER — Other Ambulatory Visit (HOSPITAL_COMMUNITY)
Admission: RE | Admit: 2020-02-05 | Discharge: 2020-02-05 | Disposition: A | Discharge status: Home / Self Care | Payer: Medicare Other | Source: Ambulatory Visit | Attending: Student | Admitting: Student

## 2020-02-05 DIAGNOSIS — Z79899 Other long term (current) drug therapy: Secondary | ICD-10-CM | POA: Insufficient documentation

## 2020-02-05 LAB — BASIC METABOLIC PANEL
Anion gap: 12 (ref 5–15)
BUN: 17 mg/dL (ref 8–23)
CO2: 27 mmol/L (ref 22–32)
Calcium: 9 mg/dL (ref 8.9–10.3)
Chloride: 98 mmol/L (ref 98–111)
Creatinine, Ser: 1.09 mg/dL (ref 0.61–1.24)
GFR calc Af Amer: >60 mL/min (ref >60)
GFR calc non Af Amer: >60 mL/min (ref >60)
Glucose, Bld: 222 mg/dL — ABNORMAL HIGH (ref 70–99)
Potassium: 3.5 mmol/L (ref 3.5–5.1)
Sodium: 137 mmol/L (ref 135–145)

## 2020-02-17 ENCOUNTER — Telehealth (INDEPENDENT_AMBULATORY_CARE_PROVIDER_SITE_OTHER): Payer: Self-pay

## 2020-02-17 DIAGNOSIS — J449 Chronic obstructive pulmonary disease, unspecified: Secondary | ICD-10-CM

## 2020-02-17 NOTE — Telephone Encounter (Signed)
I ordered referral to pulmonologist when I saw him on 02/03/20. I will order referral again. Thank you.

## 2020-02-17 NOTE — Telephone Encounter (Signed)
-----   Message from Verlon Au sent at 02/17/2020 11:38 AM EDT ----- Regarding: Lung Dr Jones Bales I just transferred a voicemail message to you.  Maralyn Sago was suppose to refer him to a lung Dr because he is having issues with breathing and said he has not heard anything from anyone and wants to check on referral.

## 2020-02-20 ENCOUNTER — Ambulatory Visit: Payer: Medicare Other | Admitting: Cardiology

## 2020-02-23 ENCOUNTER — Other Ambulatory Visit (INDEPENDENT_AMBULATORY_CARE_PROVIDER_SITE_OTHER): Payer: Self-pay | Admitting: Internal Medicine

## 2020-03-03 ENCOUNTER — Other Ambulatory Visit: Payer: Self-pay

## 2020-03-03 ENCOUNTER — Ambulatory Visit (INDEPENDENT_AMBULATORY_CARE_PROVIDER_SITE_OTHER): Payer: Medicare Other | Admitting: Nurse Practitioner

## 2020-03-03 ENCOUNTER — Encounter (INDEPENDENT_AMBULATORY_CARE_PROVIDER_SITE_OTHER): Payer: Self-pay | Admitting: Nurse Practitioner

## 2020-03-03 VITALS — BP 160/89 | HR 87 | Temp 97.7°F | Ht 71.0 in | Wt 266.4 lb

## 2020-03-03 DIAGNOSIS — I10 Essential (primary) hypertension: Secondary | ICD-10-CM

## 2020-03-03 DIAGNOSIS — I5032 Chronic diastolic (congestive) heart failure: Secondary | ICD-10-CM

## 2020-03-03 DIAGNOSIS — J449 Chronic obstructive pulmonary disease, unspecified: Secondary | ICD-10-CM

## 2020-03-03 MED ORDER — PERFOROMIST 20 MCG/2ML IN NEBU: 20.0000 ug | INHALATION_SOLUTION | Freq: Two times a day (BID) | RESPIRATORY_TRACT | 3 refills | Status: DC

## 2020-03-03 MED ORDER — ENTRESTO 49-51 MG PO TABS: 1.0000 | ORAL_TABLET | Freq: Two times a day (BID) | ORAL | 2 refills | Status: DC

## 2020-03-03 MED ORDER — BUDESONIDE 0.25 MG/2ML IN SUSP: 0.2500 mg | Freq: Two times a day (BID) | RESPIRATORY_TRACT | 3 refills | Status: DC

## 2020-03-03 NOTE — Progress Notes (Signed)
Subjective:  Patient ID: Bruce Barrera, male    DOB: [birth year]  Age: 69 y.o. MRN: [id number]  CC:  Chief Complaint  Patient presents with  . COPD  . Shortness of Breath  . Follow-up  . Hypertension  . Congestive Heart Failure      HPI  This patient comes in today for the above.  At his last office visit I did educate him about using his Symbicort inhaler on a daily basis as his maintenance medication, and using his albuterol only as needed. He tells me since his last visit he has been using his Symbicort inhaler daily, but has not felt much improvement in his breathing status. He has been experiencing a lot more fatigue and shortness of breath here lately. He is scheduled to see pulmonology next month for further evaluation and assistance with managing his COPD. He also has a history of hypertension heart failure and continues on carvedilol. I did increase his dose from 6.25 mg twice daily to 12.5 mg twice daily about 1 month ago. He is also on furosemide, potassium supplementation, and Entresto. He does not weigh himself daily, but does weigh himself frequently. He tells me he does not feel like he is fluid overloaded but he has noticed that his weight has been creeping up over the last week. He is aware that he is supposed to be on a fluid restriction and was told to drink no more than 60 ounces a day, however he tells me yesterday he felt that he was dehydrated so he drank four large glasses of water. He is wondering if he can get portable oxygen as he feels he gets quite short of breath with activity. This is affecting his quality of life, as he is not able to watch his grandchildren's sporting events, because he is not able to walk from the car to the field without significant shortness of breath. He is not interested in a wheelchair as he does not feel his wife would be able to physically push the chair. He has had a rolling walker in the past with a seat, but he gave this to a  family member and is not interested in having another one prescribed for himself. He tells me he used to be on portable oxygen as well as CPAP, however after losing power at home he was not able to use these devices and his insurance company refused to continue to pay for the oxygen and CPAP because they state he was noncompliant. He tells me he has repeated sleep study but was told on his repeat sleep study that he did not have sleep apnea.   Past Medical History:  Diagnosis Date  . AAA (abdominal aortic aneurysm) ([place])   . Arthritis    Hips and knees  . Back pain   . Chronic systolic CHF (congestive heart failure) ([place])   . Coronary artery disease    a. 03/2016 subacute anterior MI -> 100% prox LAD >72 hours out thus treated medically, LVEF 30-35% initially  . Essential hypertension   . GERD (gastroesophageal reflux disease)   . Hemorrhoids   . History of stroke    States "stroke" with left sided weakness 5/95 in prison in [place]  . Hyperlipidemia   . Hypothyroidism   . Ischemic cardiomyopathy   . MI (myocardial infarction) ([place])    States "heart attack" 2/95 in prison in [place]  . Obesity   . Peptic ulcer    GIB 1994  .  Sleep apnea   . Vitamin D deficiency disease 06/19/2019      Family History  Problem Relation Age of Onset  . Hypertension Mother   . Diabetes Mother   . Heart disease Mother   . Stroke Mother   . Stroke Father   . Heart attack Brother   . Aneurysm Brother   . Cirrhosis Brother     Social History   Social History Narrative   Lives with girlfriend for last 8 years.On disability .   Social History   Tobacco Use  . Smoking status: Current Every Day Smoker    Packs/day: 1.00    Types: Cigarettes    Start date: 05/18/1961  . Smokeless tobacco: Never Used  . Tobacco comment: smokes pack a day  Substance Use Topics  . Alcohol use: No     Current Meds  Medication Sig  . albuterol (VENTOLIN HFA) 108 (90 Base) MCG/ACT inhaler Inhale 2 puffs into the lungs  every 6 (six) hours as needed for wheezing or shortness of breath.  Marland Kitchen aspirin 81 MG tablet Take 1 tablet (81 mg total) by mouth daily with breakfast.  . atorvastatin (LIPITOR) 80 MG tablet Take 1 tablet (80 mg total) by mouth daily.  . carvedilol (COREG) 12.5 MG tablet Take 1 tablet (12.5 mg total) by mouth 2 (two) times daily with a meal.  . clopidogrel (PLAVIX) 75 MG tablet Take 1 tablet (75 mg total) by mouth daily.  . furosemide (LASIX) 40 MG tablet Take 1.5 tablets (60 mg total) by mouth daily.  Marland Kitchen levothyroxine (SYNTHROID) 100 MCG tablet Take 1 tablet (100 mcg total) by mouth daily before breakfast.  . pantoprazole (PROTONIX) 40 MG tablet TAKE (1) TABLET BY MOUTH AT BEDTIME.  Marland Kitchen Potassium Chloride ER 20 MEQ TBCR Take 20 mEq by mouth daily.  . [DISCONTINUED] budesonide-formoterol (SYMBICORT) 80-4.5 MCG/ACT inhaler Inhale 2 puffs into the lungs 2 (two) times daily.  . [DISCONTINUED] ENTRESTO 24-26 MG TAKE (1) TABLET BY MOUTH TWICE DAILY.    ROS:  Review of Systems  Constitutional: Positive for malaise/fatigue.  Eyes: Negative.   Respiratory: Positive for shortness of breath. Negative for cough and wheezing.   Cardiovascular: Positive for leg swelling. Negative for chest pain.  Neurological: Negative.      Objective:   Today's Vitals: BP (!) 160/89 (BP Location: Left Arm, Patient Position: Sitting, Cuff Size: Normal)   Pulse 87   Temp 97.7 F (36.5 C) (Temporal)   Ht _0  (1.803 m)   Wt 266 lb 6.4 oz (120.8 kg)   SpO2 91%   BMI 37.16 kg/m  Vitals with BMI 03/03/2020 02/03/2020 02/03/2020  Height _1  - _2   Weight 266 lbs 6 oz - 255 lbs  BMI 18.84 - 16.60  Systolic 630 160 109  Diastolic 89 84 90  Pulse 87 - 84     Physical Exam Vitals reviewed.  Constitutional:      Appearance: Normal appearance.  HENT:     Head: Normocephalic and atraumatic.  Cardiovascular:     Rate and Rhythm: Normal rate and regular rhythm.  Pulmonary:     Effort: Pulmonary effort is  normal. No respiratory distress.     Breath sounds: Decreased breath sounds present. No wheezing or rhonchi.  Musculoskeletal:     Cervical back: Neck supple.     Right lower leg: 1+ Pitting Edema present.     Left lower leg: 1+ Pitting Edema present.  Skin:    General: Skin is  warm and dry.  Neurological:     Mental Status: He is alert and oriented to person, place, and time.  Psychiatric:        Mood and Affect: Mood normal.        Behavior: Behavior normal.        Thought Content: Thought content normal.        Judgment: Judgment normal.    Walking O2 Sats: At rest on room air, oxygen saturations were 93%, walking on RA oxygen saturations were 91%.      Assessment and Plan   1. Chronic obstructive pulmonary disease, unspecified COPD type ([place])   2. Essential hypertension, benign   3. Chronic diastolic heart failure ([place])      Plan: 1. I encouraged him to follow-up with pulmonology as scheduled. We did discuss increasing his dose of his Symbicort, but he is not interested in having his Symbicort dosage change.  We did discuss trying alternative agents such as perforomist and budesonide in a nebulized format to see if this results in better symptom management.  He would like to try this, thus he will stop his symbicort inhaler and we will send prescription for perforomist and budesonide nebulized as an alternative to Symbicort.  We will send order for at home nebulizer machine, and I will also set him for overnight pulse oximetry testing to see if he qualifies for oxygen overnight.  2., 3. Blood pressure is above goal. I am hesitant to increase his Coreg further just because he is complaining of worsening fatigue.  I did reach out to the patient's cardiologist regarding the situation and he recommended trying to increase the patient's Entresto dose.  I did discuss this with the patient, and he is agreeable to this.  I will send refill for higher dose of Entresto and patient will  return to office in approximately 10 days to have blood work rechecked.  I also recommended that the patient continue to follow his fluid restriction and avoid salt intake as much as possible.  He was encouraged to follow-up with his cardiologist as scheduled.  The patient tells me he understands.     Tests ordered Orders Placed This Encounter  Procedures  . For home use only DME oxygen  . For home use only DME Nebulizer machine  . CMP with eGFR([facility])  . Pulse oximetry, overnight      Meds ordered this encounter  Medications  . formoterol (PERFOROMIST) 20 MCG/2ML nebulizer solution    Sig: Take 2 mLs (20 mcg total) by nebulization 2 (two) times daily.    Dispense:  120 mL    Refill:  3    Order Specific Question:   Supervising Provider    Answer:   Anastasio Champion, NIMISH C [[id number]]  . budesonide (PULMICORT) 0.25 MG/2ML nebulizer solution    Sig: Take 2 mLs (0.25 mg total) by nebulization in the morning and at bedtime.    Dispense:  60 mL    Refill:  3    Order Specific Question:   Supervising Provider    Answer:   Anastasio Champion, NIMISH C [[id number]]  . sacubitril-valsartan (ENTRESTO) 49-51 MG    Sig: Take 1 tablet by mouth 2 (two) times daily.    Dispense:  60 tablet    Refill:  2    Order Specific Question:   Supervising Provider    Answer:   Doree Albee [[id number]]    Patient to follow-up in approximately 6 weeks for close follow-up. I spent >60  minutes dedicated to the care of this patient on the date of this encounter which includes a combination of either face-to-face or virtual contact with the patient, review of records, coordination of care with at-home services, consultation with cardiology, and ordering of tests and/or procedures.   Ailene Ards, NP

## 2020-03-04 DIAGNOSIS — J449 Chronic obstructive pulmonary disease, unspecified: Secondary | ICD-10-CM | POA: Diagnosis not present

## 2020-03-05 DIAGNOSIS — J449 Chronic obstructive pulmonary disease, unspecified: Secondary | ICD-10-CM | POA: Diagnosis not present

## 2020-03-09 ENCOUNTER — Other Ambulatory Visit: Payer: Self-pay

## 2020-03-09 ENCOUNTER — Encounter: Payer: Self-pay | Admitting: Cardiology

## 2020-03-09 ENCOUNTER — Ambulatory Visit (INDEPENDENT_AMBULATORY_CARE_PROVIDER_SITE_OTHER): Payer: Medicare Other | Admitting: Cardiology

## 2020-03-09 VITALS — BP 140/80 | HR 70 | Ht 71.0 in | Wt 268.4 lb

## 2020-03-09 DIAGNOSIS — I10 Essential (primary) hypertension: Secondary | ICD-10-CM | POA: Diagnosis not present

## 2020-03-09 DIAGNOSIS — I5032 Chronic diastolic (congestive) heart failure: Secondary | ICD-10-CM | POA: Diagnosis not present

## 2020-03-09 DIAGNOSIS — I25119 Atherosclerotic heart disease of native coronary artery with unspecified angina pectoris: Secondary | ICD-10-CM

## 2020-03-09 DIAGNOSIS — J449 Chronic obstructive pulmonary disease, unspecified: Secondary | ICD-10-CM | POA: Diagnosis not present

## 2020-03-09 NOTE — Patient Instructions (Signed)
Medication Instructions:  Your physician recommends that you continue on your current medications as directed. Please refer to the Current Medication list given to you today.  *If you need a refill on your cardiac medications before your next appointment, please call your pharmacy*   Lab Work: None today If you have labs (blood work) drawn today and your tests are completely normal, you will receive your results only by: . MyChart Message (if you have MyChart) OR . A paper copy in the mail If you have any lab test that is abnormal or we need to change your treatment, we will call you to review the results.   Testing/Procedures: None today   Follow-Up: At [facility], you and your health needs are our priority.  As part of our continuing mission to provide you with exceptional heart care, we have created designated Provider Care Teams.  These Care Teams include your primary Cardiologist (physician) and Advanced Practice Providers (APPs -  Physician Assistants and Nurse Practitioners) who all work together to provide you with the care you need, when you need it.  We recommend signing up for the patient portal called "MyChart".  Sign up information is provided on this After Visit Summary.  MyChart is used to connect with patients for Virtual Visits (Telemedicine).  Patients are able to view lab/test results, encounter notes, upcoming appointments, etc.  Non-urgent messages can be sent to your provider as well.   To learn more about what you can do with MyChart, go to [email or web address].    Your next appointment:   3 month(s)  The format for your next appointment:   In Person  Provider:   Samuel McDowell, MD   Other Instructions None      Thank you for choosing [facility] !         

## 2020-03-09 NOTE — Progress Notes (Signed)
Cardiology Office Note  Date: 03/09/2020   ID: Bruce Barrera, Bruce Barrera 1950/09/15, MRN [id number]  PCP:  Wilson Singer, MD  Cardiologist:  Nona Dell, MD Electrophysiologist:  None   Chief Complaint  Patient presents with  . Cardiac follow-up    History of Present Illness: Bruce Barrera is a 69 y.o. male last seen in April by Ms. Strader PA-C.  He presents for a routine follow-up visit.  Still reports dyspnea on exertion, likely combination of cardiac and pulmonary status.  His weight is stable, he reports regular use of Lasix which has most recently been at 60 mg daily.  He had a recent follow-up with his PCP on June 23, I reviewed the note. Entresto dose was increased at that time.  He states that he is due for follow-up lab work next week.  He is also following with Dr. Randie Heinz with [facility], had a visit in March.  He has a history of AAA status post rupture with endovascular repair in December 2020.  Follow-up CTA of the abdomen and pelvis noted in January of this year as outlined below.  I reviewed his cardiac regimen.  He states that he is establishing with Pulmonary in July.  Past Medical History:  Diagnosis Date  . AAA (abdominal aortic aneurysm) (HCC)   . Arthritis    Hips and knees  . Back pain   . Chronic systolic CHF (congestive heart failure) (HCC)   . Coronary artery disease    a. 03/2016 subacute anterior MI -> 100% prox LAD >72 hours out thus treated medically, LVEF 30-35% initially  . Essential hypertension   . GERD (gastroesophageal reflux disease)   . Hemorrhoids   . History of stroke    States "stroke" with left sided weakness 5/95 in prison in [place]  . Hyperlipidemia   . Hypothyroidism   . Ischemic cardiomyopathy   . MI (myocardial infarction) (HCC)    States "heart attack" 2/95 in prison in [place]  . Obesity   . Peptic ulcer    GIB 1994  . Sleep apnea   . Vitamin D deficiency disease 06/19/2019    Past Surgical History:  Procedure Laterality  Date  . ABDOMINAL AORTIC ENDOVASCULAR STENT GRAFT N/A 09/09/2019   Procedure: ABDOMINAL AORTIC ENDOVASCULAR STENT GRAFT;  Surgeon: Maeola Harman, MD;  Location[facility] OR;  Service: Vascular;  Laterality: N/A;  . ADJUSTABLE SUTURE MANIPULATION Left 04/10/2018   Procedure: ADJUSTABLE SUTURE MANIPULATION;  Surgeon: Aura Camps, MD;  Location: [facility] OR;  Service: Ophthalmology;  Laterality: Left;  . CARDIAC CATHETERIZATION N/A 03/15/2016   Procedure: Right/Left Heart Cath and Coronary Angiography;  Surgeon: Iran Ouch, MD;  Location: [facility] INVASIVE CV LAB;  Service: Cardiovascular;  Laterality: N/A;  . COLONOSCOPY  03/23/2011   hemorrhoids  . KNEE ARTHROSCOPY     Right knee x 2  . MEDIAN RECTUS REPAIR Left 04/10/2018   Procedure: LEFT MEDIAN RECTUS REPAIR;  Surgeon: Aura Camps, MD;  Location: [facility] OR;  Service: Ophthalmology;  Laterality: Left;  Marland Kitchen MUSCLE RECESSION AND RESECTION Left 04/10/2018   Procedure: LEFT MUSCLE RECESSION;  Surgeon: Aura Camps, MD;  Location: [facility] OR;  Service: Ophthalmology;  Laterality: Left;  . TOTAL HIP ARTHROPLASTY Right 2014    Current Outpatient Medications  Medication Sig Dispense Refill  . albuterol (VENTOLIN HFA) 108 (90 Base) MCG/ACT inhaler Inhale 2 puffs into the lungs every 6 (six) hours as needed for wheezing or shortness of breath. 8 g 3  .  aspirin 81 MG tablet Take 1 tablet (81 mg total) by mouth daily with breakfast. 30 tablet 2  . atorvastatin (LIPITOR) 80 MG tablet Take 1 tablet (80 mg total) by mouth daily. 30 tablet 0  . budesonide (PULMICORT) 0.25 MG/2ML nebulizer solution Take 2 mLs (0.25 mg total) by nebulization in the morning and at bedtime. 60 mL 3  . carvedilol (COREG) 12.5 MG tablet Take 1 tablet (12.5 mg total) by mouth 2 (two) times daily with a meal. 180 tablet 0  . clopidogrel (PLAVIX) 75 MG tablet Take 1 tablet (75 mg total) by mouth daily. 30 tablet 5  . formoterol (PERFOROMIST) 20 MCG/2ML nebulizer solution Take 2 mLs  (20 mcg total) by nebulization 2 (two) times daily. 120 mL 3  . furosemide (LASIX) 40 MG tablet Take 1.5 tablets (60 mg total) by mouth daily. 135 tablet 3  . levothyroxine (SYNTHROID) 100 MCG tablet Take 1 tablet (100 mcg total) by mouth daily before breakfast. 30 tablet 0  . pantoprazole (PROTONIX) 40 MG tablet TAKE (1) TABLET BY MOUTH AT BEDTIME. 30 tablet 2  . Potassium Chloride ER 20 MEQ TBCR Take 20 mEq by mouth daily. 90 tablet 1  . sacubitril-valsartan (ENTRESTO) 49-51 MG Take 1 tablet by mouth 2 (two) times daily. 60 tablet 2   No current facility-administered medications for this visit.   Allergies:  Patient has no known allergies.   ROS:   No palpitations or syncope.  Physical Exam: VS:  BP 140/80   Pulse 70   Ht 5\' 11"  (1.803 m)   Wt 268 lb 6.4 oz (121.7 kg)   SpO2 95%   BMI 37.43 kg/m , BMI Body mass index is 37.43 kg/m.  Wt Readings from Last 3 Encounters:  03/09/20 268 lb 6.4 oz (121.7 kg)  03/03/20 266 lb 6.4 oz (120.8 kg)  02/03/20 255 lb (115.7 kg)    General: Morbidly obese male, no distress.  Using a cane. HEENT: Conjunctiva and lids normal, wearing a mask. Neck: Supple, no elevated JVP or carotid bruits, no thyromegaly. Lungs: Clear to auscultation, nonlabored breathing at rest. Cardiac: Regular rate and rhythm, no S3 or significant systolic murmur. Abdomen: Protuberant, nontender, bowel sounds present. Extremities: 1+ lower leg edema.  ECG:  An ECG dated 12/07/2019 was personally reviewed today and demonstrated:  Sinus tachycardia with PACs, left atrial enlargement, left anterior fascicular block, nonspecific T wave changes.  Recent Labwork: 12/08/2019: B Natriuretic Peptide 994.0; Magnesium 2.0 12/09/2019: Hemoglobin 13.3; Platelets 152 02/03/2020: ALT 11; AST 15; TSH 2.52 02/05/2020: BUN 17; Creatinine, Ser 1.09; Potassium 3.5; Sodium 137     Component Value Date/Time   CHOL 129 02/03/2020 1013   TRIG 111 02/03/2020 1013   HDL 34 (L) 02/03/2020 1013     CHOLHDL 3.8 02/03/2020 1013   LDLCALC 76 02/03/2020 1013    Other Studies Reviewed Today:  Echocardiogram 10/09/2019: 1. Left ventricular ejection fraction, by visual estimation, is 50 to  55%. The left ventricle has mildly decreased function. Left ventricular  septal wall thickness was mildly increased. Mildly increased left  ventricular posterior wall thickness. There  is mildly increased left ventricular hypertrophy.  2. Definity contrast agent was given IV to delineate the left ventricular  endocardial borders.  3. Elevated left ventricular end-diastolic pressure.  4. The left ventricle demonstrates regional wall motion abnormalities.  5. Apical anterior apical septal, and apical hypokinesis.  6. Global right ventricle has normal systolic function.The right  ventricular size is normal. No increase in right  ventricular wall  thickness.  7. Left atrial size was normal.  8. Right atrial size was normal.  9. The mitral valve is normal in structure. Trivial mitral valve  regurgitation. No evidence of mitral stenosis.  10. The tricuspid valve is normal in structure.  11. The tricuspid valve is normal in structure. Tricuspid valve  regurgitation is trivial.  12. The aortic valve is normal in structure. Aortic valve regurgitation is  not visualized. No evidence of aortic valve sclerosis or stenosis.  13. The pulmonic valve was normal in structure. Pulmonic valve  regurgitation is not visualized.  14. Normal pulmonary artery systolic pressure.  15. The inferior vena cava is dilated in size with >50% respiratory  variability, suggesting right atrial pressure of 8 mmHg.   Abdominal and pelvic CTA 10/06/2019: IMPRESSION: VASCULAR  1. 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 This finding is associated with  potential slight increase in size of the native abdominal aortic aneurysm sac, currently measuring, 6.0 x 6.7 x 5.5 cm, previously, 5.7 x 6.8 x 5.3 cm, though note, measurements are difficult secondary to residual periaortic stranding. No definitive extra aortic or caval contrast extravasation. 2. 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 The left renal artery remains widely patent without a hemodynamically significant narrowing 3.  Aortic aneurysm NOS (ICD10-I71.9). 4.  Aortic Atherosclerosis (ICD10-I70.0).  NON-VASCULAR  1. Peripherally enhancing macrolobulated left-sided retroperitoneal fluid collection measures approximately 9.3 cm in maximal diameter, favored to represent an evolving hematoma though infection not excluded on the basis of this examination. 2. Cholelithiasis without evidence of cholecystitis.  Assessment and Plan:  1.  Ischemic cardiomyopathy with LVEF improved to the range of 50 to 55% as of echocardiogram in January.  He still has chronic diastolic heart failure symptoms.  Plan is to continue Lasix at 60 mg daily in addition to potassium supplement.  Sherryll Burger was just increased and he otherwise continues on Coreg.  Follow-up lab work with PCP as arranged.  May be able to add Aldactone next.  2.  CAD status post previous anterior infarct in 2017 with medically managed occluded LAD, late presentation.  He does not describe any active angina at this time.  Aspirin, beta-blocker, and Lipitor.  3.  AAA status post rupture in December 2020 and now status post endovascular repair.  He continues to follow with Dr. Randie Heinz.  4.  Mixed hyperlipidemia, on Lipitor.  Recent LDL 76.  Medication Adjustments/Labs and Tests Ordered: Current medicines are reviewed at length with the patient today.  Concerns regarding medicines are outlined above.   Tests Ordered: No orders of the defined types  were placed in this encounter.   Medication Changes: No orders of the defined types were placed in this encounter.   Disposition:  Follow up 3 months in the [place] office.  Signed, Jonelle Sidle, MD, [facility] 03/09/2020 9:57 AM    [facility] [address] [place], [place] [address] Phone: [phone]; Fax: [phone]

## 2020-03-16 ENCOUNTER — Other Ambulatory Visit (INDEPENDENT_AMBULATORY_CARE_PROVIDER_SITE_OTHER): Payer: Medicare Other

## 2020-03-16 ENCOUNTER — Other Ambulatory Visit: Payer: Self-pay

## 2020-03-16 DIAGNOSIS — I10 Essential (primary) hypertension: Secondary | ICD-10-CM | POA: Diagnosis not present

## 2020-03-16 DIAGNOSIS — I5032 Chronic diastolic (congestive) heart failure: Secondary | ICD-10-CM | POA: Diagnosis not present

## 2020-03-16 LAB — COMPLETE METABOLIC PANEL WITH GFR
AG Ratio: 1.5 (calc) (ref 1.0–2.5)
ALT: 11 U/L (ref 9–46)
AST: 12 U/L (ref 10–35)
Albumin: 4 g/dL (ref 3.6–5.1)
Alkaline phosphatase (APISO): 132 U/L (ref 35–144)
BUN: 16 mg/dL (ref 7–25)
CO2: 30 mmol/L (ref 20–32)
Calcium: 9.1 mg/dL (ref 8.6–10.3)
Chloride: 103 mmol/L (ref 98–110)
Creat: 1.06 mg/dL (ref 0.70–1.25)
GFR, Est African American: 83 mL/min/{1.73_m2} (ref >=60)
GFR, Est Non African American: 72 mL/min/{1.73_m2} (ref >=60)
Globulin: 2.7 g/dL (calc) (ref 1.9–3.7)
Glucose, Bld: 173 mg/dL — ABNORMAL HIGH (ref 65–99)
Potassium: 3.9 mmol/L (ref 3.5–5.3)
Sodium: 139 mmol/L (ref 135–146)
Total Bilirubin: 0.5 mg/dL (ref 0.2–1.2)
Total Protein: 6.7 g/dL (ref 6.1–8.1)

## 2020-03-18 DIAGNOSIS — J449 Chronic obstructive pulmonary disease, unspecified: Secondary | ICD-10-CM | POA: Diagnosis not present

## 2020-04-03 ENCOUNTER — Other Ambulatory Visit (INDEPENDENT_AMBULATORY_CARE_PROVIDER_SITE_OTHER): Payer: Self-pay | Admitting: Nurse Practitioner

## 2020-04-03 ENCOUNTER — Other Ambulatory Visit (INDEPENDENT_AMBULATORY_CARE_PROVIDER_SITE_OTHER): Payer: Self-pay | Admitting: Internal Medicine

## 2020-04-03 DIAGNOSIS — I10 Essential (primary) hypertension: Secondary | ICD-10-CM

## 2020-04-04 DIAGNOSIS — J449 Chronic obstructive pulmonary disease, unspecified: Secondary | ICD-10-CM | POA: Diagnosis not present

## 2020-04-08 ENCOUNTER — Encounter: Payer: Self-pay | Admitting: Internal Medicine

## 2020-04-08 ENCOUNTER — Ambulatory Visit (HOSPITAL_COMMUNITY)
Admission: RE | Admit: 2020-04-08 | Discharge: 2020-04-08 | Disposition: A | Discharge status: Home / Self Care | Payer: Medicare Other | Source: Ambulatory Visit | Attending: Internal Medicine | Admitting: Internal Medicine

## 2020-04-08 ENCOUNTER — Other Ambulatory Visit (INDEPENDENT_AMBULATORY_CARE_PROVIDER_SITE_OTHER): Payer: Self-pay | Admitting: Internal Medicine

## 2020-04-08 ENCOUNTER — Other Ambulatory Visit: Payer: Self-pay

## 2020-04-08 ENCOUNTER — Ambulatory Visit (INDEPENDENT_AMBULATORY_CARE_PROVIDER_SITE_OTHER): Payer: Medicare Other | Admitting: Internal Medicine

## 2020-04-08 DIAGNOSIS — F17210 Nicotine dependence, cigarettes, uncomplicated: Secondary | ICD-10-CM | POA: Diagnosis not present

## 2020-04-08 DIAGNOSIS — J449 Chronic obstructive pulmonary disease, unspecified: Secondary | ICD-10-CM

## 2020-04-08 DIAGNOSIS — J811 Chronic pulmonary edema: Secondary | ICD-10-CM | POA: Diagnosis not present

## 2020-04-08 DIAGNOSIS — G4734 Idiopathic sleep related nonobstructive alveolar hypoventilation: Secondary | ICD-10-CM | POA: Diagnosis not present

## 2020-04-08 DIAGNOSIS — R0602 Shortness of breath: Secondary | ICD-10-CM | POA: Diagnosis not present

## 2020-04-08 DIAGNOSIS — R05 Cough: Secondary | ICD-10-CM | POA: Diagnosis not present

## 2020-04-08 NOTE — Assessment & Plan Note (Signed)
Placed on 2lpm hs 2021 by PCP  Also rec: Make sure you check your oxygen saturations at highest level of activity to be sure it stays over 90% and keep track of it at least once a week, more often if breathing getting worse, and let me know if losing ground.

## 2020-04-08 NOTE — Patient Instructions (Addendum)
Plan A = Automatic = Always=   Performist (formoterol) and Pulmocort (Budesonide) 1st thing in am and 12 hours later   Plan B = Backup (to supplement plan A, not to replace it) Only use your albuterol inhaler as a rescue medication to be used if you can't catch your breath by resting or doing a relaxed purse lip breathing pattern.  - The less you use it, the better it will work when you need it. - Ok to use the inhaler up to 2 puffs  every 4 hours if you must but call for appointment if use goes up over your usual need - Don't leave home without it !!  (think of it like the spare tire for your car)    Make sure you check your oxygen saturations at highest level of activity to be sure it stays over 90% and keep track of it at least once a week, more often if breathing getting worse, and let me know if losing ground.   Bring all medications / inhalers with you (put the ones you don't use in a separate bag)   The key is to stop smoking completely before smoking completely stops you!   Please remember to go to the lab and x-ray department at [facility]   for your tests - we will call you with the results when they are available.     Please schedule a follow up office visit in 6 weeks with pfts first , call sooner if needed - ok to use nebulizer first as above (plan A)

## 2020-04-08 NOTE — Assessment & Plan Note (Signed)
Counseled re importance of smoking cessation but did not meet time criteria for separate billing            Each maintenance medication was reviewed in detail including emphasizing most importantly the difference between maintenance and prns and under what circumstances the prns are to be triggered using an action plan format where appropriate.  Total time for H and P, chart review, counseling, teaching devices and generating customized AVS unique to this office visit / charting = 60 min

## 2020-04-08 NOTE — Progress Notes (Addendum)
Bruce Barrera, male    DOB: 12-30-1950,     MRN: [id number]   Brief patient profile:  65 yowm active smoker onset sob  around 2019 or 2020 and referred to pulmonary clinic in [place]  04/08/2020 by Jiles Prows.    On cpap / noct 02 by [place]  2014 but stopped cpap 2015 due to cost and restarted 02 2lpm hs by Jiles Prows.    Admission date:  12/07/2019  Admitting Physician  Olga Coaster, MD  Discharge Date:  12/09/2019   Primary MD  Wilson Singer, MD  Recommendations for primary care physician for things  to follow:   Admission Diagnosis  SOB (shortness of breath) [R06.02] Hypoxia [R09.02] CHF exacerbation (HCC) [I50.9] Troponin level elevated [R77.8] Acute congestive heart failure, unspecified heart failure type (HCC) [I50.9]  Discharge Diagnosis  SOB (shortness of breath) [R06.02] Hypoxia [R09.02] CHF exacerbation (HCC) [I50.9] Troponin level elevated [R77.8] Acute congestive heart failure, unspecified heart failure type (HCC) [I50.9]    Principal Problem:   Acute on chronic heart failure with preserved ejection fraction (HFpEF) (HCC) Active Problems:   Essential hypertension, benign   Mixed hyperlipidemia   Morbid obesity (HCC)   Hypothyroidism, adult   Acute respiratory failure with hypoxia (HCC)   Obstructive sleep apnea   CHF exacerbation (HCC)          Past Medical History:  Diagnosis Date  . AAA (abdominal aortic aneurysm) (HCC)   . Arthritis    Hips and knees  . Back pain   . Chronic systolic CHF (congestive heart failure) (HCC)   . Coronary artery disease    a. 03/2016 subacute anterior MI -> 100% prox LAD >72 hours out thus treated medically, LVEF 30-35% initially  . Essential hypertension   . GERD (gastroesophageal reflux disease)   . Hemorrhoids   . History of stroke    States "stroke" with left sided weakness 5/95 in prison in [place]  . Hyperlipidemia   . Hypothyroidism   . Ischemic cardiomyopathy   . MI  (myocardial infarction) (HCC)    States "heart attack" 2/95 in prison in [place]  . Obesity   . Peptic ulcer    GIB 1994  . Sleep apnea   . Vitamin D deficiency disease 06/19/2019         Past Surgical History:  Procedure Laterality Date  . ABDOMINAL AORTIC ENDOVASCULAR STENT GRAFT N/A 09/09/2019   Procedure: ABDOMINAL AORTIC ENDOVASCULAR STENT GRAFT;  Surgeon: Maeola Harman, MD;  Location: [facility] OR;  Service: Vascular;  Laterality: N/A;  . ADJUSTABLE SUTURE MANIPULATION Left 04/10/2018   Procedure: ADJUSTABLE SUTURE MANIPULATION;  Surgeon: Aura Camps, MD;  Location: [facility] OR;  Service: Ophthalmology;  Laterality: Left;  . CARDIAC CATHETERIZATION N/A 03/15/2016   Procedure: Right/Left Heart Cath and Coronary Angiography;  Surgeon: Iran Ouch, MD;  Location: [facility] INVASIVE CV LAB;  Service: Cardiovascular;  Laterality: N/A;  . COLONOSCOPY  03/23/2011   hemorrhoids  . KNEE ARTHROSCOPY     Right knee x 2  . MEDIAN RECTUS REPAIR Left 04/10/2018   Procedure: LEFT MEDIAN RECTUS REPAIR;  Surgeon: Aura Camps, MD;  Location: [facility] OR;  Service: Ophthalmology;  Laterality: Left;  Marland Kitchen MUSCLE RECESSION AND RESECTION Left 04/10/2018   Procedure: LEFT MUSCLE RECESSION;  Surgeon: Aura Camps, MD;  Location: [facility] OR;  Service: Ophthalmology;  Laterality: Left;  . TOTAL HIP ARTHROPLASTY Right 2014     HPI  from the history and physical done on the day  of admission:     Bruce Barrera a 68 y.o.malewith medical history significant ofischemic cardiomyopathy, heart attack, history of stroke, hypothyroidism, hyperlipidemia, reflux disease, hypertension, chronic systolic CHF, AAA, with s/p repair,vitamin D deficiency, peptic ulcer, arthritis, smoking, COPD, no longer on O2,and obesitywho presented to the ER with worsening shortness of breath. Patient had not taken his Lasix for the last 2 days as he was traveling. He reports having orthopnea, increased bilateral  lower extremity swelling.Also has mild associated cough with no expectoration.He reports having some retrosternal chest pain associated with the shortness of breath which resolved by itself after a few minutes. Described as pressure-like, nonradiating, moderate intensity, no exacerbating or relieving factors, associated with shortness of breath. No nausea, vomiting, abdominal pain, diarrhea, dysuria, hematemesis, melena, hematochezia No palpitations, fever, chills, dizziness, lightheadedness, seizures, syncope ED Course: Vital Signs reviewed on presentation, significant fortemperature 97.7, heart rate 101, blood pressure 141/94, saturation 98% on BiPAP. Labs reviewed, significant forsodium 137, potassium 3.7, BUN 15, creatinine 1.0, LFTs within normal limits, BNP 994, first 1 and 37, lactic acid negative, WBC count 15.3, hemoglobin 15.0, hematocrit 46, platelets 156. SARS-CoV-2 RT-PCR as well as flu PCR is negative. Imaging personally Reviewed,chest x-ray shows vascular congestion and pulmonary edema consistent with CHF. Worsened from prior exam. EKG personally reviewed, showssinus tachycardia, left anterior fascicular block, PACs, mild ST depressions consistent with strain pattern.     Hospital Course:   Brief Summary 69 year old with past medical history relevant forCAD with anterior MI 2017 EF 30-35% Rx medically Last echo done 10/09/19 with EF 50-55%,dilated cardiomyopathyand HFpEF---admitted on 12/08/2019 with CHF flareup in the setting of medication noncompliance  A/p  1)HFpEF---acute diastolic CHF exacerbation, in the setting of medication noncompliance PTA---cardiology consult appreciated -Echo from October 09, 2019 with EF of 50 to 55% --Treated with IV Lasix -Per cardiologist okay to continue Entresto and Coreg -And resume PTA Lasix -It appears patient was not very compliant PTA  2)acute hypoxic respiratory failure---secondary to #1 above, required BiPAP  initially has been transitioned to nasal cannula --Hypoxia resolved, patient ambulating on room air with O2 sats 93 to 95% on room air -Repeat chest x-ray on 12/09/2018 with CHF versus infection --Patient adequately diuresed okay to resume home dose Lasix at 40 mg daily -Treat empirically with Omnicef and doxycycline for possible concomitant respiratory infection, -Leukocytosis has resolved patient has no fevers no chills  3)H/o CAD---no frank ACS type symptoms at this time, elevated troponin noted, cardiology input appreciated, continue Plavix andCoreg and atorvastatin  4)H/o COPD---no acute exacerbation at this time  5)Hypothyroidism---continue levothyroxine, TSH is 3.8  6)Obesity with Possible OSA---patient was actually scheduled for sleep study this week but now is in the hospital -Will need sleep study as outpatient post discharge prior to being able to obtain a CPAP machine   7)PAD-- status post prior stent----continue Plavix and Lipitor -troponins noted, cardiology consult appreciated  8)Hypertensive Crisis-- BP controlremains challenging, treated with IV nitro drip for BP control -- transitioned to oral BP meds  on 12/09/2019 --BP remains stable on oral agents     History of Present Illness  04/08/2020  Pulmonary/ 1st office eval/ Samwise Eckardt / [address] [facility] / bud/performist but thoroughly confused with nebs/inhalers and how to use what when  Chief Complaint  Patient presents with  . Consult    productive cough, shortness of breath exertion  Dyspnea:  Room to room / does better leaning on cart hc parking [employer] x a couple of aisles / does not check sats  Cough: about the same x years/ mucus swallows / sporadic x tends to be worse first thing in am  Sleep: on side / one pillow/ bed is flat SABA use: not sure exactly how much / how many forms  0 2 2lpm hs, not using daytime  No obvious day to day or daytime variability or assoc excess/ purulent sputum or mucus  plugs or hemoptysis or cp or chest tightness, subjective wheeze or overt sinus or hb symptoms.   Sleeping ok  without nocturnal  or early am exacerbation  of respiratory  c/o's or need for noct saba. Also denies any obvious fluctuation of symptoms with weather or environmental changes or other aggravating or alleviating factors except as outlined above   No unusual exposure hx or h/o childhood pna/ asthma or knowledge of premature birth.  Current Allergies, Complete Past Medical History, Past Surgical History, Family History, and Social History were reviewed in [employer] record.  ROS  The following are not active complaints unless bolded Hoarseness, sore throat, dysphagia, dental problems, itching, sneezing,  nasal congestion or discharge of excess mucus or purulent secretions, ear ache,   fever, chills, sweats, unintended wt loss or wt gain, classically pleuritic or exertional cp,  orthopnea pnd or arm/hand swelling  or leg swelling, presyncope, palpitations, abdominal pain, anorexia, nausea, vomiting, diarrhea  or change in bowel habits or change in bladder habits, change in stools or change in urine, dysuria, hematuria,  rash, arthralgias, visual complaints, headache, numbness, weakness or ataxia or problems with walking or coordination,  change in mood or  memory.           Past Medical History:  Diagnosis Date  . AAA (abdominal aortic aneurysm) (HCC)   . Arthritis    Hips and knees  . Back pain   . Chronic systolic CHF (congestive heart failure) (HCC)   . Coronary artery disease    a. 03/2016 subacute anterior MI -> 100% prox LAD >72 hours out thus treated medically, LVEF 30-35% initially  . Essential hypertension   . GERD (gastroesophageal reflux disease)   . Hemorrhoids   . History of stroke    States "stroke" with left sided weakness 5/95 in prison in [place]  . Hyperlipidemia   . Hypothyroidism   . Ischemic cardiomyopathy   . MI (myocardial infarction) (HCC)     States "heart attack" 2/95 in prison in [place]  . Obesity   . Peptic ulcer    GIB 1994  . Sleep apnea   . Vitamin D deficiency disease 06/19/2019    Outpatient Medications Prior to Visit  Medication Sig Dispense Refill  . albuterol (VENTOLIN HFA) 108 (90 Base) MCG/ACT inhaler Inhale 2 puffs into the lungs every 6 (six) hours as needed for wheezing or shortness of breath. 8 g 3  . aspirin 81 MG tablet Take 1 tablet (81 mg total) by mouth daily with breakfast. 30 tablet 2  . atorvastatin (LIPITOR) 80 MG tablet TAKE (1) TABLET BY MOUTH ONCE DAILY. 30 tablet 2  . budesonide (PULMICORT) 0.25 MG/2ML nebulizer solution Take 2 mLs (0.25 mg total) by nebulization in the morning and at bedtime. 60 mL 3  . calcium-vitamin D (OSCAL WITH D) 500-200 MG-UNIT tablet Take 1 tablet by mouth.    . carvedilol (COREG) 12.5 MG tablet TAKE (1) TABLET BY MOUTH TWICE DAILY WITH MEALS. 180 tablet 0  . clopidogrel (PLAVIX) 75 MG tablet Take 1 tablet (75 mg total) by mouth daily. 30 tablet 5  .  cyanocobalamin 100 MCG tablet Take 100 mcg by mouth daily.    . formoterol (PERFOROMIST) 20 MCG/2ML nebulizer solution Take 2 mLs (20 mcg total) by nebulization 2 (two) times daily. 120 mL 3  . furosemide (LASIX) 40 MG tablet Take 1.5 tablets (60 mg total) by mouth daily. 135 tablet 3  . levothyroxine (SYNTHROID) 100 MCG tablet Take 1 tablet (100 mcg total) by mouth daily before breakfast. 30 tablet 0  . pantoprazole (PROTONIX) 40 MG tablet TAKE (1) TABLET BY MOUTH AT BEDTIME. 30 tablet 2  . Potassium Chloride ER 20 MEQ TBCR Take 20 mEq by mouth daily. 90 tablet 1  . sacubitril-valsartan (ENTRESTO) 49-51 MG Take 1 tablet by mouth 2 (two) times daily. 60 tablet 2   No facility-administered medications prior to visit.     Objective:     BP (!) 138/80 (BP Location: Left Arm, Cuff Size: Large)   Pulse 68   Temp 98.1 F (36.7 C) (Oral)   Ht 5\' 11"  (1.803 m)   Wt (!) 270 lb 3.2 oz (122.6 kg)   SpO2 94% Comment: Room air   BMI 37.69 kg/m   SpO2: 94 % (Room air)   Obese wm easily confused with details of care/ smoker's rattle on voluntary cough    HEENT : pt wearing mask not removed for exam due to covid - 19 concerns.    NECK :  without JVD/Nodes/TM/ nl carotid upstrokes bilaterally   LUNGS: no acc muscle use,  Mild barrel  contour chest wall with bilateral  Distant bs s audible wheeze and  without cough on insp or exp maneuvers  and mild  Hyperresonant  to  percussion bilaterally     CV:  RRR  no s3 or murmur or increase in P2, and 1+ pitting both LE's prior to AM lasix  ABD:  Obese soft and nontender with pos end  insp Hoover's  in the supine position. No bruits or organomegaly appreciated, bowel sounds nl  MS:   Nl gait/  ext warm without deformities, calf tenderness, cyanosis or clubbing No obvious joint restrictions   SKIN: warm and dry without lesions    NEURO:  alert, approp, nl sensorium with  no motor or cerebellar deficits apparent.       Labs ordered 04/08/2020  :   alpha one AT phenotype    CXR PA and Lateral:   04/08/2020 :    I personally reviewed images and   impression as follows:   Mild increase markings, non specific with mod CM / copd changes        Assessment   No problem-specific Assessment & Plan notes found for this encounter.     04/10/2020, MD 04/08/2020

## 2020-04-08 NOTE — Assessment & Plan Note (Addendum)
Active smoker -  Alpha one AT screen 04/08/2020   DDX of  difficult airways management almost all start with A and  include Adherence, Ace Inhibitors, Acid Reflux, Active Sinus Disease, Alpha 1 Antitripsin deficiency, Anxiety masquerading as Airways dz,  ABPA,  Allergy(esp in young), Aspiration (esp in elderly), Adverse effects of meds,  Active smoking or vaping, A bunch of PE's (a small clot burden can't cause this syndrome unless there is already severe underlying pulm or vascular dz with poor reserve) plus two Bs  = Bronchiectasis and Beta blocker use..and one C= CHF   Adherence is always the initial "prime suspect" and is a multilayered concern that requires a "trust but verify" approach in every patient - starting with knowing how to use medications, especially inhalers, correctly, keeping up with refills and understanding the fundamental difference between maintenance and prns vs those medications only taken for a very short course and then stopped and not refilled.  - very easily confused with details of care - return with all meds in hand using a trust but verify approach to confirm accurate Medication  Reconciliation The principal here is that until we are certain that the  patients are doing what we've asked, it makes no sense to ask them to do more.   Active smoking greatest concern > see sep a/p   ? Allergy/asthma component > perfect for bud/formoterol and add yupelri at f/u p pfts to confirm dx  ? Acid (or non-acid) GERD > always difficult to exclude as up to 75% of pts in some series report no assoc GI/ Heartburn symptoms> rec continue max (24h)  acid suppression and diet restrictions/ reviewed    ? Alpha one def > check phenotype   Adverse effects of meds > on Entresto no other suspects, tol ok so far   ? BB effects > probably not a concern on low dose coreg so far  ? chf > well compensated clinically when remembers his lasix rx    >>> f/u q 3 m, sooner prn

## 2020-04-09 NOTE — Progress Notes (Signed)
Spoke with pt and notified of results per Dr. Wert. Pt verbalized understanding and denied any questions. 

## 2020-04-14 ENCOUNTER — Telehealth (INDEPENDENT_AMBULATORY_CARE_PROVIDER_SITE_OTHER): Payer: Self-pay | Admitting: Nurse Practitioner

## 2020-04-14 NOTE — Telephone Encounter (Signed)
Noted, thank you for calling him [place].   Nellie, will you see if [facility] has the incorrect information? Maybe they were not aware he was getting his medication from [facility]? Per my records I refilled it to there last month.

## 2020-04-14 NOTE — Telephone Encounter (Signed)
Please call this patient and ask if he is still taking his atorvastatin.  He should be taking this on a daily basis, however we were given a report that he has not had this medication filled to his pharmacy for nearly 5 months.  From what I can tell I prescribed it last month, so if I need to send another prescription I can certainly do this.  Please let me know what he says.  Thank you.

## 2020-04-14 NOTE — Telephone Encounter (Signed)
Will pass it on to [facility] site person. To let her know. Thank you

## 2020-04-14 NOTE — Telephone Encounter (Signed)
Bruce Barrera states that he is taking Atorvastatin and he is getting it from [facility] 

## 2020-04-18 DIAGNOSIS — J449 Chronic obstructive pulmonary disease, unspecified: Secondary | ICD-10-CM | POA: Diagnosis not present

## 2020-05-03 ENCOUNTER — Other Ambulatory Visit (HOSPITAL_COMMUNITY)
Admission: RE | Admit: 2020-05-03 | Discharge: 2020-05-03 | Disposition: A | Discharge status: Home / Self Care | Payer: Medicare Other | Source: Ambulatory Visit | Attending: Internal Medicine | Admitting: Internal Medicine

## 2020-05-03 ENCOUNTER — Other Ambulatory Visit: Payer: Self-pay

## 2020-05-03 ENCOUNTER — Ambulatory Visit (INDEPENDENT_AMBULATORY_CARE_PROVIDER_SITE_OTHER): Payer: Medicare Other | Admitting: Internal Medicine

## 2020-05-03 ENCOUNTER — Other Ambulatory Visit (INDEPENDENT_AMBULATORY_CARE_PROVIDER_SITE_OTHER): Payer: Self-pay | Admitting: Nurse Practitioner

## 2020-05-03 ENCOUNTER — Encounter (INDEPENDENT_AMBULATORY_CARE_PROVIDER_SITE_OTHER): Payer: Self-pay | Admitting: Internal Medicine

## 2020-05-03 VITALS — BP 144/80 | HR 78 | Temp 97.3°F | Resp 20 | Ht 71.0 in | Wt 266.3 lb

## 2020-05-03 DIAGNOSIS — J449 Chronic obstructive pulmonary disease, unspecified: Secondary | ICD-10-CM | POA: Diagnosis not present

## 2020-05-03 DIAGNOSIS — E039 Hypothyroidism, unspecified: Secondary | ICD-10-CM

## 2020-05-03 DIAGNOSIS — I10 Essential (primary) hypertension: Secondary | ICD-10-CM | POA: Diagnosis not present

## 2020-05-03 DIAGNOSIS — E559 Vitamin D deficiency, unspecified: Secondary | ICD-10-CM

## 2020-05-03 DIAGNOSIS — Z20822 Contact with and (suspected) exposure to covid-19: Secondary | ICD-10-CM | POA: Insufficient documentation

## 2020-05-03 DIAGNOSIS — Z01812 Encounter for preprocedural laboratory examination: Secondary | ICD-10-CM | POA: Diagnosis not present

## 2020-05-03 LAB — SARS CORONAVIRUS 2 (TAT 6-24 HRS): SARS Coronavirus 2: NEGATIVE

## 2020-05-03 NOTE — Progress Notes (Signed)
Metrics: Intervention Frequency ACO  Documented Smoking Status Yearly  Screened one or more times in 24 months  Cessation Counseling or  Active cessation medication Past 24 months  Past 24 months   Guideline developer: UpToDate (See UpToDate for funding source) Date Released: 2014       Wellness Office Visit  Subjective:  Patient ID: Bruce Barrera, male    DOB: 04-01-1951  Age: 69 y.o. MRN: [id number]  CC: This man comes in for follow-up of hypertension, hypothyroidism, COPD and vitamin D deficiency. HPI  He is seeing pulmonology, Dr. Sherene Sires , for his COPD. He continues on Entresto for his cardiac issues as well as hypertension as well as taking carvedilol. He also continues on levothyroxine for his hypothyroidism. He is taking vitamin D3 supplementation for vitamin D deficiency but is not quite sure of the dose. He has no current acute symptoms. Past Medical History:  Diagnosis Date  . AAA (abdominal aortic aneurysm) (HCC)   . Arthritis    Hips and knees  . Back pain   . Chronic systolic CHF (congestive heart failure) (HCC)   . Coronary artery disease    a. 03/2016 subacute anterior MI -> 100% prox LAD >72 hours out thus treated medically, LVEF 30-35% initially  . Essential hypertension   . GERD (gastroesophageal reflux disease)   . Hemorrhoids   . History of stroke    States "stroke" with left sided weakness 5/95 in prison in [place]  . Hyperlipidemia   . Hypothyroidism   . Ischemic cardiomyopathy   . MI (myocardial infarction) (HCC)    States "heart attack" 2/95 in prison in [place]  . Obesity   . Peptic ulcer    GIB 1994  . Sleep apnea   . Vitamin D deficiency disease 06/19/2019   Past Surgical History:  Procedure Laterality Date  . ABDOMINAL AORTIC ENDOVASCULAR STENT GRAFT N/A 09/09/2019   Procedure: ABDOMINAL AORTIC ENDOVASCULAR STENT GRAFT;  Surgeon: Maeola Harman, MD;  Location: [facility] OR;  Service: Vascular;  Laterality: N/A;  . ADJUSTABLE SUTURE  MANIPULATION Left 04/10/2018   Procedure: ADJUSTABLE SUTURE MANIPULATION;  Surgeon: Aura Camps, MD;  Location: [facility] OR;  Service: Ophthalmology;  Laterality: Left;  . CARDIAC CATHETERIZATION N/A 03/15/2016   Procedure: Right/Left Heart Cath and Coronary Angiography;  Surgeon: Iran Ouch, MD;  Location: [facility] INVASIVE CV LAB;  Service: Cardiovascular;  Laterality: N/A;  . COLONOSCOPY  03/23/2011   hemorrhoids  . KNEE ARTHROSCOPY     Right knee x 2  . MEDIAN RECTUS REPAIR Left 04/10/2018   Procedure: LEFT MEDIAN RECTUS REPAIR;  Surgeon: Aura Camps, MD;  Location[facility] OR;  Service: Ophthalmology;  Laterality: Left;  Marland Kitchen MUSCLE RECESSION AND RESECTION Left 04/10/2018   Procedure: LEFT MUSCLE RECESSION;  Surgeon: Aura Camps, MD;  Location: [facility] OR;  Service: Ophthalmology;  Laterality: Left;  . TOTAL HIP ARTHROPLASTY Right 2014     Family History  Problem Relation Age of Onset  . Hypertension Mother   . Diabetes Mother   . Heart disease Mother   . Stroke Mother   . Stroke Father   . Heart attack Brother   . Aneurysm Brother   . Cirrhosis Brother     Social History   Social History Narrative   Lives with girlfriend for last 8 years.On disability .   Social History   Tobacco Use  . Smoking status: Current Every Day Smoker    Packs/day: 2.00    Types: Cigarettes    Start  date: 05/18/1961  . Smokeless tobacco: Never Used  . Tobacco comment: smokes 2 packs a day (04/08/2020)  Substance Use Topics  . Alcohol use: No    Current Meds  Medication Sig  . albuterol (VENTOLIN HFA) 108 (90 Base) MCG/ACT inhaler Inhale 2 puffs into the lungs every 6 (six) hours as needed for wheezing or shortness of breath.  Marland Kitchen aspirin 81 MG tablet Take 1 tablet (81 mg total) by mouth daily with breakfast.  . atorvastatin (LIPITOR) 80 MG tablet TAKE (1) TABLET BY MOUTH ONCE DAILY.  . budesonide (PULMICORT) 0.25 MG/2ML nebulizer solution Take 2 mLs (0.25 mg total) by nebulization in the morning and  at bedtime.  . calcium-vitamin D (OSCAL WITH D) 500-200 MG-UNIT tablet Take 1 tablet by mouth.  . carvedilol (COREG) 12.5 MG tablet TAKE (1) TABLET BY MOUTH TWICE DAILY WITH MEALS.  Marland Kitchen Cholecalciferol (VITAMIN D3) 50 MCG (2000 UT) TABS Take 1 tablet by mouth daily.  . clopidogrel (PLAVIX) 75 MG tablet Take 1 tablet (75 mg total) by mouth daily.  . cyanocobalamin 100 MCG tablet Take 100 mcg by mouth daily.  . formoterol (PERFOROMIST) 20 MCG/2ML nebulizer solution Take 2 mLs (20 mcg total) by nebulization 2 (two) times daily.  Marland Kitchen levothyroxine (SYNTHROID) 100 MCG tablet TAKE (1) TABLET BY MOUTH ONCE DAILY.  . pantoprazole (PROTONIX) 40 MG tablet TAKE (1) TABLET BY MOUTH AT BEDTIME.  Marland Kitchen Potassium Chloride ER 20 MEQ TBCR Take 20 mEq by mouth daily.  . sacubitril-valsartan (ENTRESTO) 49-51 MG Take 1 tablet by mouth 2 (two) times daily.      Depression screen PHQ 2/9 06/19/2019  Decreased Interest 0  Down, Depressed, Hopeless 0  PHQ - 2 Score 0     Objective:   Today's Vitals: BP (!) 144/80 (BP Location: Left Arm, Patient Position: Sitting, Cuff Size: Normal)   Pulse 78   Temp (!) 97.3 F (36.3 C) (Temporal)   Resp 20   Ht 5\' 11"  (1.803 m)   Wt 266 lb 4.8 oz (120.8 kg)   SpO2 97% Comment: wearing mask.  BMI 37.14 kg/m  Vitals with BMI 05/03/2020 04/08/2020 03/09/2020  Height 5\' 11"  5\' 11"  5\' 11"   Weight 266 lbs 5 oz 270 lbs 3 oz 268 lbs 6 oz  BMI 37.16 37.7 37.45  Systolic 144 138 03/11/2020  Diastolic 80 80 80  Pulse 78 68 70     Physical Exam   He looks systemically well.  He has lost about 4 pounds since last visit in the system.  Blood pressure is acceptable for his age.  He is alert and orientated without any obvious focal neurological signs.    Assessment   1. Essential hypertension, benign   2. Chronic obstructive pulmonary disease, unspecified COPD type (HCC)   3. Vitamin D deficiency disease   4. Hypothyroidism, adult       Tests ordered No orders of the defined  types were placed in this encounter.    Plan: 1. He will continue with current antihypertensive therapy that are listed above. 2. He will continue with inhalers and follow-up with pulmonology. 3. He will let know what dose of vitamin D3 is taking as he is not quite sure.  According to , she had recommended vitamin D3 2000 units daily.  I think he probably needs more than this but he will let know. 4. He will continue with levothyroxine for his hypothyroidism. 5. Follow-up with 784 in about 3 months time when he will  likely need blood work.   No orders of the defined types were placed in this encounter.   Wilson Singer, MD

## 2020-05-05 ENCOUNTER — Ambulatory Visit (HOSPITAL_COMMUNITY)
Admission: RE | Admit: 2020-05-05 | Discharge: 2020-05-05 | Disposition: A | Discharge status: Home / Self Care | Payer: Medicare Other | Source: Ambulatory Visit | Attending: Internal Medicine | Admitting: Internal Medicine

## 2020-05-05 DIAGNOSIS — J449 Chronic obstructive pulmonary disease, unspecified: Secondary | ICD-10-CM | POA: Diagnosis not present

## 2020-05-05 LAB — PULMONARY FUNCTION TEST
DL/VA % pred: 92 %
DL/VA: 3.77 ml/min/mmHg/L
DLCO unc % pred: 71 %
DLCO unc: 19.31 ml/min/mmHg
FEF 25-75 Post: 0.64 L/s
FEF 25-75 Pre: 0.61 L/s
FEF2575-%Change-Post: 3 %
FEF2575-%Pred-Post: 23 %
FEF2575-%Pred-Pre: 22 %
FEV1-%Change-Post: -1 %
FEV1-%Pred-Post: 41 %
FEV1-%Pred-Pre: 42 %
FEV1-Post: 1.44 L
FEV1-Pre: 1.46 L
FEV1FVC-%Change-Post: -1 %
FEV1FVC-%Pred-Pre: 78 %
FEV6-%Change-Post: 0 %
FEV6-%Pred-Post: 54 %
FEV6-%Pred-Pre: 54 %
FEV6-Post: 2.4 L
FEV6-Pre: 2.41 L
FEV6FVC-%Change-Post: 0 %
FEV6FVC-%Pred-Post: 100 %
FEV6FVC-%Pred-Pre: 101 %
FVC-%Change-Post: 0 %
FVC-%Pred-Post: 53 %
FVC-%Pred-Pre: 54 %
FVC-Post: 2.52 L
FVC-Pre: 2.52 L
Post FEV1/FVC ratio: 57 %
Post FEV6/FVC ratio: 95 %
Pre FEV1/FVC ratio: 58 %
Pre FEV6/FVC Ratio: 96 %
RV % pred: 127 %
RV: 3.14 L
TLC % pred: 78 %
TLC: 5.68 L

## 2020-05-05 MED ORDER — ALBUTEROL SULFATE (2.5 MG/3ML) 0.083% IN NEBU
2.5000 mg | INHALATION_SOLUTION | Freq: Once | RESPIRATORY_TRACT | Status: AC
  Administered 2020-05-05: 2.5 mg via RESPIRATORY_TRACT

## 2020-05-06 DIAGNOSIS — J449 Chronic obstructive pulmonary disease, unspecified: Secondary | ICD-10-CM | POA: Diagnosis not present

## 2020-05-13 ENCOUNTER — Other Ambulatory Visit: Payer: Self-pay

## 2020-05-13 DIAGNOSIS — I713 Abdominal aortic aneurysm, ruptured, unspecified: Secondary | ICD-10-CM

## 2020-05-18 ENCOUNTER — Telehealth: Payer: Self-pay | Admitting: Internal Medicine

## 2020-05-18 DIAGNOSIS — J449 Chronic obstructive pulmonary disease, unspecified: Secondary | ICD-10-CM

## 2020-05-18 NOTE — Telephone Encounter (Signed)
ATC patient phone had busy signal will try again

## 2020-05-18 NOTE — Progress Notes (Signed)
LMTCB x 1 

## 2020-05-19 DIAGNOSIS — J449 Chronic obstructive pulmonary disease, unspecified: Secondary | ICD-10-CM | POA: Diagnosis not present

## 2020-05-20 NOTE — Telephone Encounter (Signed)
Called and spoke to pt. Informed him of the results and recs per Dr. Sherene Sires. Pt states he will go tomorrow (9/10) to have labs drawn. Pt needs a follow up appt, however, there are no current openings at the [place] office.   Call patient : Study is c/w gold stage 3 copd (4 is the worst) so rec follow recs from prev ov and return to office decide next steps / complete the work up (did not go to lab as rec)    Dr. Sherene Sires, please advise if ok to schedule pt a follow up visit at the [facility] office in a held spot for HFU and consults. Thanks.

## 2020-05-20 NOTE — Telephone Encounter (Signed)
That's  Fine.

## 2020-05-20 NOTE — Telephone Encounter (Signed)
Tried calling the pt x 2 line was busy

## 2020-05-22 NOTE — Telephone Encounter (Signed)
Tried calling and pt's line was busy

## 2020-05-24 NOTE — Progress Notes (Signed)
Cardiology Office Note    Date:  05/25/2020   ID:  Bruce Barrera, [place] 1951-07-17, MRN [id number]  PCP:  Wilson SingerGosrani, Nimish C, MD  Cardiologist: [name], MD    Chief Complaint  Patient presents with  . Follow-up    3 month visit    History of Present Illness:    Bruce Barrera is a 69 y.o. male with past medical history of CAD (anterior MI in 2017 with cath showing occluded LAD and medical management recommended), history of ischemic cardiomyopathy (EF 30-35% in 03/2016, improved to 50-55% by echo in 09/2019), chronic diastolic CHF, COPD, HTN, HLD and ruptured AAA (s/p endograft repair in 08/2019) who presents to the office today for 7023-month follow-up.   He was last examined by Dr. [name] in 02/2020 and reported chronic dyspnea on exertion but he denied any associated symptoms and weight had been stable on his home scales (268 lbs on the office scales). He was continued on his current medication regimen at that time.   In talking with the patient today, he reports doing well from a cardiac perspective since his last visit. He does have baseline dyspnea on exertion in the setting of COPD and is being followed by [facility]. He does have supplemental oxygen at his home but typically only has to use this at night. He denies any recent chest pain or palpitations. No recent orthopnea or PND.  He was previously having a productive cough but says this has improved with the use of his nebulizer treatments. Weight has been stable on his home scales and he denies any recent edema.  Still smoking approximately 2 ppd (previously up to 3 ppd). Has been smoking since age 69.    Past Medical History:  Diagnosis Date  . AAA (abdominal aortic aneurysm) (HCC)   . Arthritis    Hips and knees  . Back pain   . Chronic systolic CHF (congestive heart failure) (HCC)   . Coronary artery disease    a. 03/2016 subacute anterior MI -> 100% prox LAD >72 hours out thus treated medically,  LVEF 30-35% initially  . Essential hypertension   . GERD (gastroesophageal reflux disease)   . Hemorrhoids   . History of stroke    States "stroke" with left sided weakness [place]  . Hyperlipidemia   . Hypothyroidism   . Ischemic cardiomyopathy   . MI (myocardial infarction) (HCC)    States "heart attack" 2/95 in [place] in [place]  . Obesity   . Peptic ulcer    GIB 1994  . Sleep apnea   . Vitamin D deficiency disease 06/19/2019    Past Surgical History:  Procedure Laterality Date  . ABDOMINAL AORTIC ENDOVASCULAR STENT GRAFT N/A 09/09/2019   Procedure: ABDOMINAL AORTIC ENDOVASCULAR STENT GRAFT;  Surgeon: Maeola Harmanain, Brandon Christopher, MD;  [facility];  Service: Vascular;  Laterality: N/A;  . ADJUSTABLE SUTURE MANIPULATION Left 04/10/2018   Procedure: ADJUSTABLE SUTURE MANIPULATION;  Surgeon: Aura CampsSpencer, Michael, MD;  [facility];  Service: Ophthalmology;  Laterality: Left;  . CARDIAC CATHETERIZATION N/A 03/15/2016   Procedure: Right/Left Heart Cath and Coronary Angiography;  Surgeon: Iran OuchMuhammad A Arida, MD;  Location: [facility] INVASIVE CV LAB;  Service: Cardiovascular;  Laterality: N/A;  . COLONOSCOPY  03/23/2011   hemorrhoids  . KNEE ARTHROSCOPY     Right knee x 2  . MEDIAN RECTUS REPAIR Left 04/10/2018   Procedure: LEFT MEDIAN RECTUS REPAIR;  Surgeon: Aura CampsSpencer, Michael, MD;  Location: [facility] OR;  Service: Ophthalmology;  Laterality: Left;  Marland Kitchen MUSCLE RECESSION AND RESECTION Left 04/10/2018   Procedure: LEFT MUSCLE RECESSION;  Surgeon: Aura Camps, MD;  Location: [facility] OR;  Service: Ophthalmology;  Laterality: Left;  . TOTAL HIP ARTHROPLASTY Right 2014    Current Medications: Outpatient Medications Prior to Visit  Medication Sig Dispense Refill  . albuterol (VENTOLIN HFA) 108 (90 Base) MCG/ACT inhaler Inhale 2 puffs into the lungs every 6 (six) hours as needed for wheezing or shortness of breath. 8 g 3  . aspirin 81 MG tablet Take 1 tablet (81 mg total) by mouth daily with breakfast.  30 tablet 2  . budesonide (PULMICORT) 0.25 MG/2ML nebulizer solution Take 2 mLs (0.25 mg total) by nebulization in the morning and at bedtime. 60 mL 3  . calcium-vitamin D (OSCAL WITH D) 500-200 MG-UNIT tablet Take 1 tablet by mouth.    . Cholecalciferol (VITAMIN D3) 50 MCG (2000 UT) TABS Take 1 tablet by mouth daily.    . cyanocobalamin 100 MCG tablet Take 100 mcg by mouth daily.    . formoterol (PERFOROMIST) 20 MCG/2ML nebulizer solution Take 2 mLs (20 mcg total) by nebulization 2 (two) times daily. 120 mL 3  . furosemide (LASIX) 40 MG tablet Take 1.5 tablets (60 mg total) by mouth daily. 135 tablet 3  . levothyroxine (SYNTHROID) 100 MCG tablet TAKE (1) TABLET BY MOUTH ONCE DAILY. 90 tablet 0  . pantoprazole (PROTONIX) 40 MG tablet TAKE (1) TABLET BY MOUTH AT BEDTIME. 30 tablet 2  . sacubitril-valsartan (ENTRESTO) 49-51 MG Take 1 tablet by mouth 2 (two) times daily. 60 tablet 2  . atorvastatin (LIPITOR) 80 MG tablet TAKE (1) TABLET BY MOUTH ONCE DAILY. 30 tablet 2  . carvedilol (COREG) 12.5 MG tablet TAKE (1) TABLET BY MOUTH TWICE DAILY WITH MEALS. 180 tablet 0  . clopidogrel (PLAVIX) 75 MG tablet Take 1 tablet (75 mg total) by mouth daily. 30 tablet 5  . Potassium Chloride ER 20 MEQ TBCR Take 20 mEq by mouth daily. 90 tablet 1  . potassium chloride SA (KLOR-CON) 20 MEQ tablet Take 20 mEq by mouth daily.     No facility-administered medications prior to visit.     Allergies:   Patient has no known allergies.   Social History   Socioeconomic History  . Marital status: Divorced    Spouse name: Not on file  . Number of children: Not on file  . Years of education: Not on file  . Highest education level: Not on file  Occupational History  . Occupation: Disabled    Comment: Carpenter  Tobacco Use  . Smoking status: Current Every Day Smoker    Packs/day: 2.75    Types: Cigarettes    Start date: 05/18/1961  . Smokeless tobacco: Never Used  . Tobacco comment: smokes 2 packs a day  (04/08/2020)  Vaping Use  . Vaping Use: Never used  Substance and Sexual Activity  . Alcohol use: No  . Drug use: No  . Sexual activity: Not on file  Other Topics Concern  . Not on file  Social History Narrative   Lives with girlfriend for last 8 years.On disability .   Social Determinants of Health   Financial Resource Strain:   . Difficulty of Paying Living Expenses: Not on file  Food Insecurity:   . Worried About Programme researcher, broadcasting/film/video in the Last Year: Not on file  . Ran Out of Food in the Last Year: Not on file  Transportation Needs:   .  Lack of Transportation (Medical): Not on file  . Lack of Transportation (Non-Medical): Not on file  Physical Activity:   . Days of Exercise per Week: Not on file  . Minutes of Exercise per Session: Not on file  Stress:   . Feeling of Stress : Not on file  Social Connections:   . Frequency of Communication with Friends and Family: Not on file  . Frequency of Social Gatherings with Friends and Family: Not on file  . Attends Religious Services: Not on file  . Active Member of Clubs or Organizations: Not on file  . Attends Banker Meetings: Not on file  . Marital Status: Not on file     Family History:  The patient's family history includes Aneurysm in his brother; Cirrhosis in his brother; Diabetes in his mother; Heart attack in his brother; Heart disease in his mother; Hypertension in his mother; Stroke in his father and mother.   Review of Systems:   Please see the history of present illness.     General:  No chills, fever, night sweats or weight changes.  Cardiovascular:  No chest pain, edema, orthopnea, palpitations, paroxysmal nocturnal dyspnea. Positive for dyspnea on exertion.  Dermatological: No rash, lesions/masses Respiratory: No cough. Positive for dyspnea. Urologic: No hematuria, dysuria Abdominal:   No nausea, vomiting, diarrhea, bright red blood per rectum, melena, or hematemesis Neurologic:  No visual changes,  wkns, changes in mental status. All other systems reviewed and are otherwise negative except as noted above.   Physical Exam:    VS:  BP 130/76   Pulse 78   Ht  (1.803 m)   Wt 271 lb (122.9 kg)   SpO2 93%   BMI 37.80 kg/m    General: Well developed, well nourished,male appearing in no acute distress. Head: Normocephalic, atraumatic. Neck: No carotid bruits. JVD not elevated.  Lungs: Respirations regular and unlabored, without wheezes or rales. Occasional rhonchi.  Heart: Regular rate and rhythm. No S3 or S4.  No murmur, no rubs, or gallops appreciated. Abdomen: Appears non-distended. No obvious abdominal masses. Msk:  Strength and tone appear normal for age. No obvious joint deformities or effusions. Extremities: No clubbing or cyanosis. Trace ankle edema bilaterally.  Distal pedal pulses are 2+ bilaterally. Neuro: Alert and oriented X 3. Moves all extremities spontaneously. No focal deficits noted. Psych:  Responds to questions appropriately with a normal affect. Skin: No rashes or lesions noted  Wt Readings from Last 3 Encounters:  05/25/20 271 lb (122.9 kg)  05/03/20 266 lb 4.8 oz (120.8 kg)  04/08/20 (!) 270 lb 3.2 oz (122.6 kg)     Studies/Labs Reviewed:   EKG:  EKG is not ordered today.    Recent Labs: 12/08/2019: B Natriuretic Peptide 994.0; Magnesium 2.0 12/09/2019: Hemoglobin 13.3; Platelets 152 02/03/2020: TSH 2.52 03/16/2020: ALT 11; BUN 16; Creat 1.06; Potassium 3.9; Sodium 139   Lipid Panel    Component Value Date/Time   CHOL 129 02/03/2020 1013   TRIG 111 02/03/2020 1013   HDL 34 (L) 02/03/2020 1013   CHOLHDL 3.8 02/03/2020 1013   LDLCALC 76 02/03/2020 1013    Additional studies/ records that were reviewed today include:   Cardiac Catheterization: 03/2016  Prox LAD lesion, 100% stenosed.  Ost RCA lesion, 20% stenosed.  There is moderate to severe left ventricular systolic dysfunction.   1. Mildly elevated filling pressures with mild  pulmonary hypertension. Normal cardiac output. Mean pulmonary capillary wedge pressure was 18 mmHg. 2. Severe one-vessel coronary  artery disease with an occluded proximal LAD after giving a large diagonal branch which supplies a large territory of the anterolateral wall. Otherwise no obstructive disease. Left dominant system. Ostial RCA spasm improved with nitroglycerin. Very faint right to left and left to left collaterals to the LAD. 3. Moderately reduced LV systolic function with an ejection fraction of 35%. Akinesis of mid to distal anterior and apical wall.  Recommendations: Occluded proximal LAD of more than 72 hours of duration. With the lack of current anginal symptoms, there is no indication for revascularization. I recommend continuing medical therapy for coronary artery disease and ischemic cardiomyopathy. LAD PCI can be considered in the future for refractory anginal symptoms if there is anterior viability.  Renal Dopplers: 09/2019 Renal:    Right: RRV flow present. Normal size right kidney. Normal cortical     thickness of right kidney. Unable to definately identify     right renal artery.  Left: Normal size of left kidney. Normal cortical thickness of the     left kidney. No evidence of left renal artery stenosis. LRV     flow present.   Echocardiogram: 09/2019 IMPRESSIONS    1. Left ventricular ejection fraction, by visual estimation, is 50 to  55%. The left ventricle has mildly decreased function. Left ventricular  septal wall thickness was mildly increased. Mildly increased left  ventricular posterior wall thickness. There  is mildly increased left ventricular hypertrophy.  2. Definity contrast agent was given IV to delineate the left ventricular  endocardial borders.  3. Elevated left ventricular end-diastolic pressure.  4. The left ventricle demonstrates regional wall motion abnormalities.  5. Apical anterior apical septal, and apical  hypokinesis.  6. Global right ventricle has normal systolic function.The right  ventricular size is normal. No increase in right ventricular wall  thickness.  7. Left atrial size was normal.  8. Right atrial size was normal.  9. The mitral valve is normal in structure. Trivial mitral valve  regurgitation. No evidence of mitral stenosis.  10. The tricuspid valve is normal in structure.  11. The tricuspid valve is normal in structure. Tricuspid valve  regurgitation is trivial.  12. The aortic valve is normal in structure. Aortic valve regurgitation is  not visualized. No evidence of aortic valve sclerosis or stenosis.  13. The pulmonic valve was normal in structure. Pulmonic valve  regurgitation is not visualized.  14. Normal pulmonary artery systolic pressure.  15. The inferior vena cava is dilated in size with >50% respiratory  variability, suggesting right atrial pressure of 8 mmHg.    Assessment:    1. Coronary artery disease involving native coronary artery of native heart without angina pectoris   2. Chronic diastolic (congestive) heart failure (HCC)   3. Essential hypertension, benign   4. Hyperlipidemia LDL goal <70   5. Ruptured abdominal aortic aneurysm (HCC)   6. Tobacco abuse      Plan:   In order of problems listed above:  1. CAD - He is s/p anterior MI in 2017 with cath showing occluded LAD and medical management recommended. He has baseline dyspnea on exertion in the setting of COPD but denies any recent chest pain.  - Continue ASA 81mg  daily, Plavix 75mg  daily, Coreg 12.5mg  BID and Atorvastatin 80mg  daily.   2. Chronic Diastolic CHF - EF was previously 30-35% in 03/2016, improved to 50-55% by echo in 09/2019. He has dyspnea on exertion but denies any orthopnea or PND. Lower extremity edema improved from prior examinations and weight  has been stable. Continue Lasix 60mg  daily along with Coreg 12.5mg  BID and Entresto 49-51mg  BID. He has been taking K-dur 30 mEq  daily for several months due to issues with hypokalemia earlier this year and an updated Rx was sent to his pharmacy.   3. HTN - BP is well-controlled at 130/76 during today's visit. Continue current medication regimen.   4. HLD - Followed by PCP. FLP in 01/2020 showed total cholesterol of 129, HDL 34 and LDL 76. Remains on Atorvastatin 80mg  daily.   5. Ruptured AAA - s/p endograft repair in 08/2019. Followed by Vascular Surgery and scheduled for a repeat CT Scan next week.   6. Tobacco Use - Previously smoking 3 ppd, now at 2 ppd. Congratulated on reduction with cessation advised.    Medication Adjustments/Labs and Tests Ordered: Current medicines are reviewed at length with the patient today.  Concerns regarding medicines are outlined above.  Medication changes, Labs and Tests ordered today are listed in the Patient Instructions below. Patient Instructions  Medication Instructions:  Your physician recommends that you continue on your current medications as directed. Please refer to the Current Medication list given to you today.  *If you need a refill on your cardiac medications before your next appointment, please call your pharmacy*   Lab Work: NONE   If you have labs (blood work) drawn today and your tests are completely normal, you will receive your results only by: MyChart Message (if you have MyChart) OR . A paper copy in the mail If you have any lab test that is abnormal or we need to change your treatment, we will call you to review the results.   Testing/Procedures: NONE    Follow-Up: At [facility], you and your health needs are our priority.  As part of our continuing mission to provide you with exceptional heart care, we have created designated Provider Care Teams.  These Care Teams include your primary Cardiologist (physician) and Advanced Practice Providers (APPs -  Physician Assistants and Nurse Practitioners) who all work together to provide you with the  care you need, when you need it.  We recommend signing up for the patient portal called "MyChart".  Sign up information is provided on this After Visit Summary.  MyChart is used to connect with patients for Virtual Visits (Telemedicine).  Patients are able to view lab/test results, encounter notes, upcoming appointments, etc.  Non-urgent messages can be sent to your provider as well.   To learn more about what you can do with MyChart, go to Marland Kitchen.    Your next appointment:   6 month(s)  The format for your next appointment:   In Person  Provider:   Dr. [facility]    Other Instructions Thank you for choosing [facility]!       Signed, [email or web address], PA-C  05/25/2020 2:09 PM    [facility] [address] [place], [address] [place] Phone: [phone] Fax: [phone]

## 2020-05-25 ENCOUNTER — Telehealth: Payer: Self-pay | Admitting: Internal Medicine

## 2020-05-25 ENCOUNTER — Ambulatory Visit (INDEPENDENT_AMBULATORY_CARE_PROVIDER_SITE_OTHER): Payer: Medicare Other | Admitting: Student

## 2020-05-25 ENCOUNTER — Other Ambulatory Visit: Payer: Self-pay

## 2020-05-25 ENCOUNTER — Encounter: Payer: Self-pay | Admitting: Student

## 2020-05-25 VITALS — BP 130/76 | HR 78 | Ht 71.0 in | Wt 271.0 lb

## 2020-05-25 DIAGNOSIS — I10 Essential (primary) hypertension: Secondary | ICD-10-CM | POA: Diagnosis not present

## 2020-05-25 DIAGNOSIS — I713 Abdominal aortic aneurysm, ruptured, unspecified: Secondary | ICD-10-CM

## 2020-05-25 DIAGNOSIS — E785 Hyperlipidemia, unspecified: Secondary | ICD-10-CM

## 2020-05-25 DIAGNOSIS — Z720 Tobacco use: Secondary | ICD-10-CM

## 2020-05-25 DIAGNOSIS — I5032 Chronic diastolic (congestive) heart failure: Secondary | ICD-10-CM | POA: Diagnosis not present

## 2020-05-25 DIAGNOSIS — I2510 Atherosclerotic heart disease of native coronary artery without angina pectoris: Secondary | ICD-10-CM

## 2020-05-25 MED ORDER — POTASSIUM CHLORIDE CRYS ER 20 MEQ PO TBCR: 30.0000 meq | EXTENDED_RELEASE_TABLET | Freq: Every day | ORAL | 3 refills | Status: DC

## 2020-05-25 MED ORDER — ATORVASTATIN CALCIUM 80 MG PO TABS: 80.0000 mg | ORAL_TABLET | Freq: Every day | ORAL | 3 refills | Status: DC

## 2020-05-25 MED ORDER — CLOPIDOGREL BISULFATE 75 MG PO TABS: 75.0000 mg | ORAL_TABLET | Freq: Every day | ORAL | 3 refills | Status: DC

## 2020-05-25 MED ORDER — CARVEDILOL 12.5 MG PO TABS: 12.5000 mg | ORAL_TABLET | Freq: Two times a day (BID) | ORAL | 3 refills | Status: DC

## 2020-05-25 NOTE — Telephone Encounter (Signed)
Called pt x 2 and his line was busy

## 2020-05-25 NOTE — Patient Instructions (Signed)

## 2020-05-25 NOTE — Telephone Encounter (Signed)
ATC pt on home phone, line was busy.  Called pt's EC, Alice, pts brother. She gave me pt's cell number: [phone].  Called cell and spoke to pt and scheduled appt with Dr. Sherene Sires on 9/22. Pt states he went to [facility] to have the labs drawn and the lab didn't see any labs to be drawn.   Called [facility] and was on hold for 8 min. Will need to call back to see what needs to be done for the lab to seen by [facility]. Pt needs lab drawn before visit with Dr. Sherene Sires.

## 2020-05-27 NOTE — Telephone Encounter (Signed)
I replaced the lab orders and they appear correct  I too could not get through to the lab  The line just rings  [facility] tomorrow

## 2020-05-27 NOTE — Telephone Encounter (Signed)
Called [facility] and was transferred to the lab x 3 and disconnected each time.   Bruce Barrera, can you please look at the [facility] lab to see if the way the order was placed is correct? Pt needs to get lab drawn prior to his appt on 9/22 to see Dr. Sherene Sires.

## 2020-05-27 NOTE — Telephone Encounter (Signed)
See other phone note from 05/18/20. Will sign off.

## 2020-05-31 ENCOUNTER — Other Ambulatory Visit (INDEPENDENT_AMBULATORY_CARE_PROVIDER_SITE_OTHER): Payer: Self-pay | Admitting: Internal Medicine

## 2020-06-01 ENCOUNTER — Ambulatory Visit
Admission: RE | Admit: 2020-06-01 | Discharge: 2020-06-01 | Disposition: A | Discharge status: Home / Self Care | Payer: Medicare Other | Source: Ambulatory Visit | Attending: Vascular Surgery | Admitting: Vascular Surgery

## 2020-06-01 ENCOUNTER — Other Ambulatory Visit (HOSPITAL_COMMUNITY)
Admission: RE | Admit: 2020-06-01 | Discharge: 2020-06-01 | Disposition: A | Discharge status: Home / Self Care | Payer: Medicare Other | Source: Ambulatory Visit | Attending: Internal Medicine | Admitting: Internal Medicine

## 2020-06-01 DIAGNOSIS — J449 Chronic obstructive pulmonary disease, unspecified: Secondary | ICD-10-CM | POA: Diagnosis not present

## 2020-06-01 DIAGNOSIS — I713 Abdominal aortic aneurysm, ruptured, unspecified: Secondary | ICD-10-CM

## 2020-06-01 DIAGNOSIS — I700 Atherosclerosis of aorta: Secondary | ICD-10-CM | POA: Diagnosis not present

## 2020-06-01 DIAGNOSIS — N261 Atrophy of kidney (terminal): Secondary | ICD-10-CM | POA: Diagnosis not present

## 2020-06-01 DIAGNOSIS — I714 Abdominal aortic aneurysm, without rupture: Secondary | ICD-10-CM | POA: Diagnosis not present

## 2020-06-01 DIAGNOSIS — K8020 Calculus of gallbladder without cholecystitis without obstruction: Secondary | ICD-10-CM | POA: Diagnosis not present

## 2020-06-01 MED ORDER — IOPAMIDOL (ISOVUE-370) INJECTION 76%
75.0000 mL | Freq: Once | INTRAVENOUS | Status: AC | PRN
  Administered 2020-06-01: 75 mL via INTRAVENOUS

## 2020-06-01 NOTE — Telephone Encounter (Signed)
Called and spoke to pt. Pt aware to have lab drawn at [facility] and if there are issues to have the lab call our office while he is still there. Pt verbalized understanding and denied any further questions or concerns at this time.

## 2020-06-02 ENCOUNTER — Other Ambulatory Visit: Payer: Self-pay

## 2020-06-02 ENCOUNTER — Encounter: Payer: Self-pay | Admitting: Internal Medicine

## 2020-06-02 ENCOUNTER — Ambulatory Visit (INDEPENDENT_AMBULATORY_CARE_PROVIDER_SITE_OTHER): Payer: Medicare Other | Admitting: Internal Medicine

## 2020-06-02 DIAGNOSIS — G4734 Idiopathic sleep related nonobstructive alveolar hypoventilation: Secondary | ICD-10-CM

## 2020-06-02 DIAGNOSIS — F17210 Nicotine dependence, cigarettes, uncomplicated: Secondary | ICD-10-CM

## 2020-06-02 DIAGNOSIS — J449 Chronic obstructive pulmonary disease, unspecified: Secondary | ICD-10-CM

## 2020-06-02 LAB — ALPHA-1 ANTITRYPSIN PHENOTYPE: A-1 Antitrypsin, Ser: 148 mg/dL (ref 101–187)

## 2020-06-02 MED ORDER — YUPELRI 175 MCG/3ML IN SOLN: 175.0000 ug | RESPIRATORY_TRACT | 11 refills | Status: DC

## 2020-06-02 NOTE — Assessment & Plan Note (Signed)
4-5 min discussion re active cigarette smoking in addition to office E&M  Ask about tobacco use:   Ongoing  Advise quitting   I took an extended  opportunity with this patient to outline the consequences of continued cigarette use  in airway disorders based on all the data we have from the multiple national lung health studies (perfomed over decades at millions of dollars in cost)  indicating that smoking cessation, not choice of inhalers or physicians, is the most important aspect of care.   Assess willingness:  Not fully  committed at this point Assist in quit attempt:  Per PCP when ready Arrange follow up:   Follow up per Primary Care planned  For smoking cessation classes call [phone]

## 2020-06-02 NOTE — Progress Notes (Signed)
Bruce Barrera, male    DOB: 12-30-1950,     MRN: [id number]   Brief patient profile:  65 yowm active smoker onset sob  around 2019 or 2020 and referred to pulmonary clinic in [place]  04/08/2020 by Jiles Prows.    On cpap / noct 02 by [place]  2014 but stopped cpap 2015 due to cost and restarted 02 2lpm hs by Jiles Prows.    Admission date:  12/07/2019  Admitting Physician  Olga Coaster, MD  Discharge Date:  12/09/2019   Primary MD  Wilson Singer, MD  Recommendations for primary care physician for things  to follow:   Admission Diagnosis  SOB (shortness of breath) [R06.02] Hypoxia [R09.02] CHF exacerbation (HCC) [I50.9] Troponin level elevated [R77.8] Acute congestive heart failure, unspecified heart failure type (HCC) [I50.9]  Discharge Diagnosis  SOB (shortness of breath) [R06.02] Hypoxia [R09.02] CHF exacerbation (HCC) [I50.9] Troponin level elevated [R77.8] Acute congestive heart failure, unspecified heart failure type (HCC) [I50.9]    Principal Problem:   Acute on chronic heart failure with preserved ejection fraction (HFpEF) (HCC) Active Problems:   Essential hypertension, benign   Mixed hyperlipidemia   Morbid obesity (HCC)   Hypothyroidism, adult   Acute respiratory failure with hypoxia (HCC)   Obstructive sleep apnea   CHF exacerbation (HCC)          Past Medical History:  Diagnosis Date  . AAA (abdominal aortic aneurysm) (HCC)   . Arthritis    Hips and knees  . Back pain   . Chronic systolic CHF (congestive heart failure) (HCC)   . Coronary artery disease    a. 03/2016 subacute anterior MI -> 100% prox LAD >72 hours out thus treated medically, LVEF 30-35% initially  . Essential hypertension   . GERD (gastroesophageal reflux disease)   . Hemorrhoids   . History of stroke    States "stroke" with left sided weakness 5/95 in prison in [place]  . Hyperlipidemia   . Hypothyroidism   . Ischemic cardiomyopathy   . MI  (myocardial infarction) (HCC)    States "heart attack" 2/95 in prison in [place]  . Obesity   . Peptic ulcer    GIB 1994  . Sleep apnea   . Vitamin D deficiency disease 06/19/2019         Past Surgical History:  Procedure Laterality Date  . ABDOMINAL AORTIC ENDOVASCULAR STENT GRAFT N/A 09/09/2019   Procedure: ABDOMINAL AORTIC ENDOVASCULAR STENT GRAFT;  Surgeon: Maeola Harman, MD;  Location: [facility] OR;  Service: Vascular;  Laterality: N/A;  . ADJUSTABLE SUTURE MANIPULATION Left 04/10/2018   Procedure: ADJUSTABLE SUTURE MANIPULATION;  Surgeon: Aura Camps, MD;  Location: [facility] OR;  Service: Ophthalmology;  Laterality: Left;  . CARDIAC CATHETERIZATION N/A 03/15/2016   Procedure: Right/Left Heart Cath and Coronary Angiography;  Surgeon: Iran Ouch, MD;  Location: [facility] INVASIVE CV LAB;  Service: Cardiovascular;  Laterality: N/A;  . COLONOSCOPY  03/23/2011   hemorrhoids  . KNEE ARTHROSCOPY     Right knee x 2  . MEDIAN RECTUS REPAIR Left 04/10/2018   Procedure: LEFT MEDIAN RECTUS REPAIR;  Surgeon: Aura Camps, MD;  Location: [facility] OR;  Service: Ophthalmology;  Laterality: Left;  Marland Kitchen MUSCLE RECESSION AND RESECTION Left 04/10/2018   Procedure: LEFT MUSCLE RECESSION;  Surgeon: Aura Camps, MD;  Location: [facility] OR;  Service: Ophthalmology;  Laterality: Left;  . TOTAL HIP ARTHROPLASTY Right 2014     HPI  from the history and physical done on the day  of admission:     XUX:YBFX Bruce Barrera a 68 y.o.malewith medical history significant ofischemic cardiomyopathy, heart attack, history of stroke, hypothyroidism, hyperlipidemia, reflux disease, hypertension, chronic systolic CHF, AAA, with s/p repair,vitamin D deficiency, peptic ulcer, arthritis, smoking, COPD, no longer on O2,and obesitywho presented to the ER with worsening shortness of breath. Patient had not taken his Lasix for the last 2 days as he was traveling. He reports having orthopnea, increased bilateral  lower extremity swelling.Also has mild associated cough with no expectoration.He reports having some retrosternal chest pain associated with the shortness of breath which resolved by itself after a few minutes. Described as pressure-like, nonradiating, moderate intensity, no exacerbating or relieving factors, associated with shortness of breath. No nausea, vomiting, abdominal pain, diarrhea, dysuria, hematemesis, melena, hematochezia No palpitations, fever, chills, dizziness, lightheadedness, seizures, syncope ED Course: Vital Signs reviewed on presentation, significant fortemperature 97.7, heart rate 101, blood pressure 141/94, saturation 98% on BiPAP. Labs reviewed, significant forsodium 137, potassium 3.7, BUN 15, creatinine 1.0, LFTs within normal limits, BNP 994, first 1 and 37, lactic acid negative, WBC count 15.3, hemoglobin 15.0, hematocrit 46, platelets 156. SARS-CoV-2 RT-PCR as well as flu PCR is negative. Imaging personally Reviewed,chest x-ray shows vascular congestion and pulmonary edema consistent with CHF. Worsened from prior exam. EKG personally reviewed, showssinus tachycardia, left anterior fascicular block, PACs, mild ST depressions consistent with strain pattern.     Hospital Course:   Brief Summary 69 year old with past medical history relevant forCAD with anterior MI 2017 EF 30-35% Rx medically Last echo done 10/09/19 with EF 50-55%,dilated cardiomyopathyand HFpEF---admitted on 12/08/2019 with CHF flareup in the setting of medication noncompliance  A/p  1)HFpEF---acute diastolic CHF exacerbation, in the setting of medication noncompliance PTA---cardiology consult appreciated -Echo from October 09, 2019 with EF of 50 to 55% --Treated with IV Lasix -Per cardiologist okay to continue Entresto and Coreg -And resume PTA Lasix -It appears patient was not very compliant PTA  2)acute hypoxic respiratory failure---secondary to #1 above, required BiPAP  initially has been transitioned to nasal cannula --Hypoxia resolved, patient ambulating on room air with O2 sats 93 to 95% on room air -Repeat chest x-ray on 12/09/2018 with CHF versus infection --Patient adequately diuresed okay to resume home dose Lasix at 40 mg daily -Treat empirically with Omnicef and doxycycline for possible concomitant respiratory infection, -Leukocytosis has resolved patient has no fevers no chills  3)H/o CAD---no frank ACS type symptoms at this time, elevated troponin noted, cardiology input appreciated, continue Plavix andCoreg and atorvastatin  4)H/o COPD---no acute exacerbation at this time  5)Hypothyroidism---continue levothyroxine, TSH is 3.8  6)Obesity with Possible OSA---patient was actually scheduled for sleep study this week but now is in the hospital -Will need sleep study as outpatient post discharge prior to being able to obtain a CPAP machine   7)PAD-- status post prior stent----continue Plavix and Lipitor -troponins noted, cardiology consult appreciated  8)Hypertensive Crisis-- BP controlremains challenging, treated with IV nitro drip for BP control -- transitioned to oral BP meds  on 12/09/2019 --BP remains stable on oral agents     History of Present Illness  04/08/2020  Pulmonary/ 1st office eval/ Bethanee Redondo / [address] [facility] / bud/performist but thoroughly confused with nebs/inhalers and how to use what when  Chief Complaint  Patient presents with  . Consult    productive cough, shortness of breath exertion  Dyspnea:  Room to room / does better leaning on cart [employer]c parking [employer] x a couple of aisles / does not check sats  Cough: about the same x years/ mucus swallows / sporadic x tends to be worse first thing in am  Sleep: on side / one pillow/ bed is flat SABA use: not sure exactly how much / how many forms  0 2 2lpm hs, not using daytime rec Plan A = Automatic = Always=   Performist (formoterol) and Pulmocort (Budesonide) 1st  thing in am and 12 hours later  Plan B = Backup (to supplement plan A, not to replace it) Only use your albuterol inhaler as a rescue medication Make sure you check your oxygen saturations at highest level of activity to be sure it stays over 90% and keep track of it at least once a week, more often if breathing getting worse, and let me know if losing ground.  Bring all medications / inhalers with you (put the ones you don't use in a separate bag)  The key is to stop smoking completely before smoking completely stops you! Please schedule a follow up office visit in 6 weeks with pfts first , call sooner if needed - ok to use nebulizer first as above (plan A)    06/02/2020  f/u ov/Bona Hubbard re:  GOLD III  On performist/ bud   Still smoking   02 hs only  Chief Complaint  Patient presents with  . Follow-up    shortness of breath with exertion   Dyspnea:  Can do still wm leaning on cart,  Uses hc parking  Cough: rattle smoker's  Worse in ams clear over 5 min  Sleeping: on side/ bed flat  SABA use: maybe once a day  02: 2lpm hs only    No obvious day to day or daytime variability or assoc excess/ purulent sputum or mucus plugs or hemoptysis or cp or chest tightness, subjective wheeze or overt sinus or hb symptoms.   Sleeping as above without nocturnal  or early am exacerbation  of respiratory  c/o's or need for noct saba. Also denies any obvious fluctuation of symptoms with weather or environmental changes or other aggravating or alleviating factors except as outlined above   No unusual exposure hx or h/o childhood pna/ asthma or knowledge of premature birth.  Current Allergies, Complete Past Medical History, Past Surgical History, Family History, and Social History were reviewed in [employer] record.  ROS  The following are not active complaints unless bolded Hoarseness, sore throat, dysphagia, dental problems, itching, sneezing,  nasal congestion or discharge of excess  mucus or purulent secretions, ear ache,   fever, chills, sweats, unintended wt loss or wt gain, classically pleuritic or exertional cp,  orthopnea pnd or arm/hand swelling  or leg swelling, presyncope, palpitations, abdominal pain, anorexia, nausea, vomiting, diarrhea  or change in bowel habits or change in bladder habits, change in stools or change in urine, dysuria, hematuria,  rash, arthralgias, visual complaints, headache, numbness, weakness or ataxia or problems with walking or coordination,  change in mood or  memory.        Current Meds  Medication Sig  . albuterol (VENTOLIN HFA) 108 (90 Base) MCG/ACT inhaler Inhale 2 puffs into the lungs every 6 (six) hours as needed for wheezing or shortness of breath.  Marland Kitchen aspirin 81 MG tablet Take 1 tablet (81 mg total) by mouth daily with breakfast.  . atorvastatin (LIPITOR) 80 MG tablet Take 1 tablet (80 mg total) by mouth daily.  . budesonide (PULMICORT) 0.25 MG/2ML nebulizer solution Take 2 mLs (0.25 mg total) by nebulization in the morning  and at bedtime.  . calcium-vitamin D (OSCAL WITH D) 500-200 MG-UNIT tablet Take 1 tablet by mouth.  . carvedilol (COREG) 12.5 MG tablet Take 1 tablet (12.5 mg total) by mouth 2 (two) times daily with a meal.  . Cholecalciferol (VITAMIN D3) 50 MCG (2000 UT) TABS Take 1 tablet by mouth daily.  . clopidogrel (PLAVIX) 75 MG tablet Take 1 tablet (75 mg total) by mouth daily.  . cyanocobalamin 100 MCG tablet Take 100 mcg by mouth daily.  . formoterol (PERFOROMIST) 20 MCG/2ML nebulizer solution Take 2 mLs (20 mcg total) by nebulization 2 (two) times daily.  . furosemide (LASIX) 40 MG tablet Take 1.5 tablets (60 mg total) by mouth daily.  Marland Kitchen. levothyroxine (SYNTHROID) 100 MCG tablet TAKE (1) TABLET BY MOUTH ONCE DAILY.  . pantoprazole (PROTONIX) 40 MG tablet TAKE (1) TABLET BY MOUTH AT BEDTIME.  Marland Kitchen. potassium chloride SA (KLOR-CON) 20 MEQ tablet Take 1.5 tablets (30 mEq total) by mouth daily.  . sacubitril-valsartan (ENTRESTO)  49-51 MG Take 1 tablet by mouth 2 (two) times daily.  . SYMBICORT 80-4.5 MCG/ACT inhaler Inhale into the lungs.                     Past Medical History:  Diagnosis Date  . AAA (abdominal aortic aneurysm) (HCC)   . Arthritis    Hips and knees  . Back pain   . Chronic systolic CHF (congestive heart failure) (HCC)   . Coronary artery disease    a. 03/2016 subacute anterior MI -> 100% prox LAD >72 hours out thus treated medically, LVEF 30-35% initially  . Essential hypertension   . GERD (gastroesophageal reflux disease)   . Hemorrhoids   . History of stroke    States "stroke" with left sided weakness 5/95 in prison in [place]  . Hyperlipidemia   . Hypothyroidism   . Ischemic cardiomyopathy   . MI (myocardial infarction) (HCC)    States "heart attack" 2/95 in prison in [place]  . Obesity   . Peptic ulcer    GIB 1994  . Sleep apnea   . Vitamin D deficiency disease 06/19/2019      Objective:     Wt Readings from Last 3 Encounters:  06/02/20 270 lb 6.4 oz (122.7 kg)  05/25/20 271 lb (122.9 kg)  05/03/20 266 lb 4.8 oz (120.8 kg)     Vital signs reviewed - Note on arrival 06/02/2020  02 sats  94% on RA       HEENT : pt wearing mask not removed for exam due to covid - 19 concerns.    NECK :  without JVD/Nodes/TM/ nl carotid upstrokes bilaterally   LUNGS: no acc muscle use,  Mild barrel  contour chest wall with bilateral  Distant bs s audible wheeze and  without cough on insp or exp maneuvers  and mild  Hyperresonant  to  percussion bilaterally     CV:  RRR  no s3 or murmur or increase in P2, and 1+ pitting both LEs   ABD:  soft and nontender with pos end  insp Hoover's  in the supine position. No bruits or organomegaly appreciated, bowel sounds nl  MS:   Nl gait/  ext warm without deformities, calf tenderness, cyanosis or clubbing No obvious joint restrictions   SKIN: warm and dry without lesions    NEURO:  alert, approp, nl sensorium with  no motor or cerebellar deficits  apparent.          I  personally reviewed images and agree with radiology impression as follows:   Chest CT cuts on abd ct  06/01/20 Lower chest: Limited visualization of the lower thorax demonstrates very minimal bronchial wall thickening. Minimal pleuroparenchymal thickening at the level of the left costophrenic angle. No discrete focal airspace opacities. No pleural effusion.    Labs ordered 06/02/2020  :   alpha one AT phenotype            Assessment

## 2020-06-02 NOTE — Patient Instructions (Addendum)
Plan A = Automatic = Always=   Performist (formoterol) and Pulmocort (Budesonide) 1st thing in am along with yupelri  and 12 hours later just take the performist/ budesonide by themselves.   Stop yupelri if more trouble with urination and it should improved in a few days at most   Plan B = Backup (to supplement plan A, not to replace it) Only use your albuterol inhaler as a rescue medication to be used if you can't catch your breath by resting or doing a relaxed purse lip breathing pattern.  - The less you use it, the better it will work when you need it. - Ok to use the inhaler up to 2 puffs  every 4 hours if you must but call for appointment if use goes up over your usual need - Don't leave home without it !!  (think of it like the spare tire for your car)    Make sure you check your oxygen saturations at highest level of activity to be sure it stays over 90% and keep track of it at least once a week, more often if breathing getting worse, and let me know if losing ground.   Try albuterol 15 min before an activity that you know would make you short of breath and see if it makes any difference and if makes none then don't take it after activity unless you can't catch your breath.       The key is to stop smoking completely before smoking completely stops you!      Please schedule a follow up visit in 77months but call sooner if needed

## 2020-06-02 NOTE — Assessment & Plan Note (Signed)
Active smoker   PFT's  05/05/20.  FEV1 1.46 (42 % ) ratio 0.58  p 0 % improvement from saba p 0 prior to study with DLCO  19.31 (71%) corrects to 3.77 (92%)  for alv volume and FV curve classic concavity   -  Alpha one AT screen 06/01/2020 >  - 06/02/2020  After extensive coaching inhaler device,  effectiveness =  75%   hfa  - 06/02/2020 added yupelri to laba/ics nebs    Group D in terms of symptom/risk and laba/lama/ICS  therefore appropriate rx at this point >>>  Continue perforomist/ bud bid and add yupelri trial q am (warned re urinary retention)    I spent extra time with pt today reviewing appropriate use of albuterol for prn use on exertion with the following points: 1) saba is for relief of sob that does not improve by walking a slower pace or resting but rather if the pt does not improve after trying this first. 2) If the pt is convinced, as many are, that saba helps recover from activity faster then it's easy to tell if this is the case by re-challenging : ie stop, take the inhaler, then p 5 minutes try the exact same activity (intensity of workload) that just caused the symptoms and see if they are substantially diminished or not after saba 3) if there is an activity that reproducibly causes the symptoms, try the saba 15 min before the activity on alternate days   If in fact the saba really does help, then fine to continue to use it prn but advised may need to look closer at the maintenance regimen being used to achieve better control of airways disease with exertion.

## 2020-06-03 NOTE — Progress Notes (Signed)
Tried calling the pt and his line was busy, will call back

## 2020-06-04 ENCOUNTER — Encounter: Payer: Self-pay | Admitting: Vascular Surgery

## 2020-06-04 ENCOUNTER — Other Ambulatory Visit: Payer: Self-pay

## 2020-06-04 ENCOUNTER — Ambulatory Visit (INDEPENDENT_AMBULATORY_CARE_PROVIDER_SITE_OTHER): Payer: Medicare Other | Admitting: Vascular Surgery

## 2020-06-04 VITALS — BP 150/89 | HR 71 | Temp 97.7°F | Resp 20 | Ht 71.0 in | Wt 270.0 lb

## 2020-06-04 DIAGNOSIS — I713 Abdominal aortic aneurysm, ruptured, unspecified: Secondary | ICD-10-CM

## 2020-06-04 NOTE — Progress Notes (Signed)
Patient ID: Bruce Barrera, male   DOB: [birth year], 69 y.o.   MRN: [id number]  Reason for Consult: Follow-up   Referred by Wilson Singer, MD  Subjective:     HPI:  Bruce Barrera is a 69 y.o. male history of ruptured abdominal aortic aneurysm.  Currently doing very well.  He has no new back or abdominal pain.  He follows up with CT scan.  Past Medical History:  Diagnosis Date  . AAA (abdominal aortic aneurysm) (HCC)   . Arthritis    Hips and knees  . Back pain   . Chronic systolic CHF (congestive heart failure) (HCC)   . Coronary artery disease    a. 03/2016 subacute anterior MI -> 100% prox LAD >72 hours out thus treated medically, LVEF 30-35% initially  . Essential hypertension   . GERD (gastroesophageal reflux disease)   . Hemorrhoids   . History of stroke    States "stroke" with left sided weakness 5/95 in prison in [place]  . Hyperlipidemia   . Hypothyroidism   . Ischemic cardiomyopathy   . MI (myocardial infarction) (HCC)    States "heart attack" 2/95 in prison in [place]  . Obesity   . Peptic ulcer    GIB 1994  . Sleep apnea   . Vitamin D deficiency disease 06/19/2019   Family History  Problem Relation Age of Onset  . Hypertension Mother   . Diabetes Mother   . Heart disease Mother   . Stroke Mother   . Stroke Father   . Heart attack Brother   . Aneurysm Brother   . Cirrhosis Brother    Past Surgical History:  Procedure Laterality Date  . ABDOMINAL AORTIC ENDOVASCULAR STENT GRAFT N/A 09/09/2019   Procedure: ABDOMINAL AORTIC ENDOVASCULAR STENT GRAFT;  Surgeon: Maeola Harman, MD;  Location: [facility] OR;  Service: Vascular;  Laterality: N/A;  . ADJUSTABLE SUTURE MANIPULATION Left 04/10/2018   Procedure: ADJUSTABLE SUTURE MANIPULATION;  Surgeon: Aura Camps, MD;  Location: [facility] OR;  Service: Ophthalmology;  Laterality: Left;  . CARDIAC CATHETERIZATION N/A 03/15/2016   Procedure: Right/Left Heart Cath and Coronary Angiography;  Surgeon: Iran Ouch, MD;  Location: [facility] INVASIVE CV LAB;  Service: Cardiovascular;  Laterality: N/A;  . COLONOSCOPY  03/23/2011   hemorrhoids  . KNEE ARTHROSCOPY     Right knee x 2  . MEDIAN RECTUS REPAIR Left 04/10/2018   Procedure: LEFT MEDIAN RECTUS REPAIR;  Surgeon: Aura Camps, MD;  Location: [facility] OR;  Service: Ophthalmology;  Laterality: Left;  Marland Kitchen MUSCLE RECESSION AND RESECTION Left 04/10/2018   Procedure: LEFT MUSCLE RECESSION;  Surgeon: Aura Camps, MD;  Location: [facility] OR;  Service: Ophthalmology;  Laterality: Left;  . TOTAL HIP ARTHROPLASTY Right 2014    Short Social History:  Social History   Tobacco Use  . Smoking status: Current Every Day Smoker    Packs/day: 1.50    Years: 58.00    Pack years: 87.00    Types: Cigarettes    Start date: 05/18/1961  . Smokeless tobacco: Never Used  . Tobacco comment: smokes 35 cigarettes a day (06/02/2020)  Substance Use Topics  . Alcohol use: No    No Known Allergies  Current Outpatient Medications  Medication Sig Dispense Refill  . albuterol (VENTOLIN HFA) 108 (90 Base) MCG/ACT inhaler Inhale 2 puffs into the lungs every 6 (six) hours as needed for wheezing or shortness of breath. 8 g 3  . aspirin 81 MG tablet Take 1 tablet (81 mg total)  by mouth daily with breakfast. 30 tablet 2  . atorvastatin (LIPITOR) 80 MG tablet Take 1 tablet (80 mg total) by mouth daily. 90 tablet 3  . budesonide (PULMICORT) 0.25 MG/2ML nebulizer solution Take 2 mLs (0.25 mg total) by nebulization in the morning and at bedtime. 60 mL 3  . calcium-vitamin D (OSCAL WITH D) 500-200 MG-UNIT tablet Take 1 tablet by mouth.    . carvedilol (COREG) 12.5 MG tablet Take 1 tablet (12.5 mg total) by mouth 2 (two) times daily with a meal. 180 tablet 3  . Cholecalciferol (VITAMIN D3) 50 MCG (2000 UT) TABS Take 1 tablet by mouth daily.    . clopidogrel (PLAVIX) 75 MG tablet Take 1 tablet (75 mg total) by mouth daily. 90 tablet 3  . cyanocobalamin 100 MCG tablet Take 100 mcg by mouth  daily.    . formoterol (PERFOROMIST) 20 MCG/2ML nebulizer solution Take 2 mLs (20 mcg total) by nebulization 2 (two) times daily. 120 mL 3  . levothyroxine (SYNTHROID) 100 MCG tablet TAKE (1) TABLET BY MOUTH ONCE DAILY. 90 tablet 0  . pantoprazole (PROTONIX) 40 MG tablet TAKE (1) TABLET BY MOUTH AT BEDTIME. 30 tablet 0  . potassium chloride SA (KLOR-CON) 20 MEQ tablet Take 1.5 tablets (30 mEq total) by mouth daily. 135 tablet 3  . revefenacin (YUPELRI) 175 MCG/3ML nebulizer solution Inhale 3 mLs (175 mcg total) into the lungs every morning. 90 mL 11  . sacubitril-valsartan (ENTRESTO) 49-51 MG Take 1 tablet by mouth 2 (two) times daily. 60 tablet 2  . SYMBICORT 80-4.5 MCG/ACT inhaler Inhale into the lungs.    . furosemide (LASIX) 40 MG tablet Take 1.5 tablets (60 mg total) by mouth daily. 135 tablet 3   No current facility-administered medications for this visit.    Review of Systems  Constitutional:  Constitutional negative. HENT: HENT negative.  Eyes: Eyes negative.  Respiratory: Respiratory negative.  Cardiovascular: Cardiovascular negative.  GI: Gastrointestinal negative.  Musculoskeletal: Musculoskeletal negative.  Skin: Skin negative.  Neurological: Neurological negative. Hematologic: Hematologic/lymphatic negative.  Psychiatric: Psychiatric negative.        Objective:  Objective   Vitals:   06/04/20 1413  BP: (!) 150/89  Pulse: 71  Resp: 20  Temp: 97.7 F (36.5 C)  SpO2: 93%  Weight: 270 lb (122.5 kg)  Height: 5\' 11"  (1.803 m)   Body mass index is 37.66 kg/m.  Physical Exam HENT:     Head: Normocephalic.     Nose:     Comments: Wearing a mask Eyes:     Pupils: Pupils are equal, round, and reactive to light.  Cardiovascular:     Rate and Rhythm: Normal rate.     Pulses: Normal pulses.  Pulmonary:     Effort: Pulmonary effort is normal.  Abdominal:     General: Abdomen is flat.     Palpations: Abdomen is soft.  Musculoskeletal:        General: Normal  range of motion.  Skin:    General: Skin is warm and dry.     Capillary Refill: Capillary refill takes less than 2 seconds.  Neurological:     General: No focal deficit present.     Mental Status: He is alert.  Psychiatric:        Mood and Affect: Mood normal.        Thought Content: Thought content normal.        Judgment: Judgment normal.     Data: IMPRESSION: VASCULAR  1. 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 Again, while there is no definitive contrast opacification of the native abdominal aortic aneurysm sac though there is increased attenuation of the sac on the delayed phase images and as such, continued attention on follow-up is advised. No perivascular stranding. Aortic aneurysm NOS (ICD10-I71.9). 2. Aortic Atherosclerosis (ICD10-I70.0). 3. 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 The left renal artery remains widely patent.     Assessment/Plan:     69 year old male status post endovascular aneurysm repair for rupture.  Previously had a type II endoleak not identified today.  Aneurysm is somewhat larger.  Given the patient's large size we will follow him up with CT again as I do not think ultrasound would give an accurate estimation of the size.     Maeola Harman MD [facility]

## 2020-06-05 DIAGNOSIS — J449 Chronic obstructive pulmonary disease, unspecified: Secondary | ICD-10-CM | POA: Diagnosis not present

## 2020-06-10 ENCOUNTER — Encounter: Payer: Self-pay | Admitting: *Deleted

## 2020-06-10 NOTE — Progress Notes (Signed)
Letter mailed

## 2020-06-18 DIAGNOSIS — J449 Chronic obstructive pulmonary disease, unspecified: Secondary | ICD-10-CM | POA: Diagnosis not present

## 2020-06-19 ENCOUNTER — Other Ambulatory Visit (INDEPENDENT_AMBULATORY_CARE_PROVIDER_SITE_OTHER): Payer: Self-pay | Admitting: Nurse Practitioner

## 2020-06-19 DIAGNOSIS — I5032 Chronic diastolic (congestive) heart failure: Secondary | ICD-10-CM

## 2020-06-19 DIAGNOSIS — I10 Essential (primary) hypertension: Secondary | ICD-10-CM

## 2020-07-05 DIAGNOSIS — J449 Chronic obstructive pulmonary disease, unspecified: Secondary | ICD-10-CM | POA: Diagnosis not present

## 2020-07-06 ENCOUNTER — Other Ambulatory Visit (INDEPENDENT_AMBULATORY_CARE_PROVIDER_SITE_OTHER): Payer: Self-pay | Admitting: Nurse Practitioner

## 2020-07-19 DIAGNOSIS — J449 Chronic obstructive pulmonary disease, unspecified: Secondary | ICD-10-CM | POA: Diagnosis not present

## 2020-08-03 ENCOUNTER — Ambulatory Visit (INDEPENDENT_AMBULATORY_CARE_PROVIDER_SITE_OTHER): Payer: Medicare Other | Admitting: Nurse Practitioner

## 2020-08-05 DIAGNOSIS — J449 Chronic obstructive pulmonary disease, unspecified: Secondary | ICD-10-CM | POA: Diagnosis not present

## 2020-08-09 ENCOUNTER — Other Ambulatory Visit (INDEPENDENT_AMBULATORY_CARE_PROVIDER_SITE_OTHER): Payer: Self-pay | Admitting: Internal Medicine

## 2020-08-18 DIAGNOSIS — J449 Chronic obstructive pulmonary disease, unspecified: Secondary | ICD-10-CM | POA: Diagnosis not present

## 2020-09-04 DIAGNOSIS — J449 Chronic obstructive pulmonary disease, unspecified: Secondary | ICD-10-CM | POA: Diagnosis not present

## 2020-09-07 ENCOUNTER — Other Ambulatory Visit (INDEPENDENT_AMBULATORY_CARE_PROVIDER_SITE_OTHER): Payer: Self-pay | Admitting: Internal Medicine

## 2020-09-09 ENCOUNTER — Other Ambulatory Visit (INDEPENDENT_AMBULATORY_CARE_PROVIDER_SITE_OTHER): Payer: Self-pay | Admitting: Internal Medicine

## 2020-09-18 DIAGNOSIS — J449 Chronic obstructive pulmonary disease, unspecified: Secondary | ICD-10-CM | POA: Diagnosis not present

## 2020-09-21 ENCOUNTER — Other Ambulatory Visit (INDEPENDENT_AMBULATORY_CARE_PROVIDER_SITE_OTHER): Payer: Self-pay | Admitting: Internal Medicine

## 2020-10-05 DIAGNOSIS — J449 Chronic obstructive pulmonary disease, unspecified: Secondary | ICD-10-CM | POA: Diagnosis not present

## 2020-10-11 ENCOUNTER — Telehealth (INDEPENDENT_AMBULATORY_CARE_PROVIDER_SITE_OTHER): Payer: Self-pay

## 2020-10-11 ENCOUNTER — Other Ambulatory Visit: Payer: Self-pay

## 2020-10-11 ENCOUNTER — Encounter (HOSPITAL_COMMUNITY): Payer: Self-pay | Admitting: *Deleted

## 2020-10-11 ENCOUNTER — Emergency Department (HOSPITAL_COMMUNITY)
Admission: EM | Admit: 2020-10-11 | Discharge: 2020-10-11 | Disposition: A | Discharge status: Home / Self Care | Payer: Medicare Other | Attending: Emergency Medicine | Admitting: Emergency Medicine

## 2020-10-11 DIAGNOSIS — Z96641 Presence of right artificial hip joint: Secondary | ICD-10-CM | POA: Diagnosis not present

## 2020-10-11 DIAGNOSIS — N3001 Acute cystitis with hematuria: Secondary | ICD-10-CM | POA: Insufficient documentation

## 2020-10-11 DIAGNOSIS — J449 Chronic obstructive pulmonary disease, unspecified: Secondary | ICD-10-CM | POA: Diagnosis not present

## 2020-10-11 DIAGNOSIS — R319 Hematuria, unspecified: Secondary | ICD-10-CM | POA: Diagnosis present

## 2020-10-11 DIAGNOSIS — Z79899 Other long term (current) drug therapy: Secondary | ICD-10-CM | POA: Insufficient documentation

## 2020-10-11 DIAGNOSIS — Z955 Presence of coronary angioplasty implant and graft: Secondary | ICD-10-CM | POA: Diagnosis not present

## 2020-10-11 DIAGNOSIS — E039 Hypothyroidism, unspecified: Secondary | ICD-10-CM | POA: Insufficient documentation

## 2020-10-11 DIAGNOSIS — I2510 Atherosclerotic heart disease of native coronary artery without angina pectoris: Secondary | ICD-10-CM | POA: Diagnosis not present

## 2020-10-11 DIAGNOSIS — F17210 Nicotine dependence, cigarettes, uncomplicated: Secondary | ICD-10-CM | POA: Diagnosis not present

## 2020-10-11 DIAGNOSIS — Z7982 Long term (current) use of aspirin: Secondary | ICD-10-CM | POA: Insufficient documentation

## 2020-10-11 DIAGNOSIS — I5022 Chronic systolic (congestive) heart failure: Secondary | ICD-10-CM | POA: Insufficient documentation

## 2020-10-11 DIAGNOSIS — I110 Hypertensive heart disease with heart failure: Secondary | ICD-10-CM | POA: Diagnosis not present

## 2020-10-11 LAB — URINALYSIS, ROUTINE W REFLEX MICROSCOPIC
Bilirubin Urine: NEGATIVE
Glucose, UA: >=500 mg/dL — AB
Ketones, ur: 5 mg/dL — AB
Nitrite: POSITIVE — AB
Protein, ur: 30 mg/dL — AB
RBC / HPF: >50 RBC/hpf — ABNORMAL HIGH (ref 0–5)
Specific Gravity, Urine: 1.021 (ref 1.005–1.030)
WBC, UA: >50 WBC/hpf — ABNORMAL HIGH (ref 0–5)
pH: 6 (ref 5.0–8.0)

## 2020-10-11 LAB — CBC
HCT: 50.4 % (ref 39.0–52.0)
Hemoglobin: 16.1 g/dL (ref 13.0–17.0)
MCH: 32.6 pg (ref 26.0–34.0)
MCHC: 31.9 g/dL (ref 30.0–36.0)
MCV: 102 fL — ABNORMAL HIGH (ref 80.0–100.0)
Platelets: 144 10*3/uL — ABNORMAL LOW (ref 150–400)
RBC: 4.94 MIL/uL (ref 4.22–5.81)
RDW: 14.6 % (ref 11.5–15.5)
WBC: 8.4 10*3/uL (ref 4.0–10.5)
nRBC: 0 % (ref 0.0–0.2)

## 2020-10-11 LAB — BASIC METABOLIC PANEL
Anion gap: 7 (ref 5–15)
BUN: 16 mg/dL (ref 8–23)
CO2: 29 mmol/L (ref 22–32)
Calcium: 8.7 mg/dL — ABNORMAL LOW (ref 8.9–10.3)
Chloride: 97 mmol/L — ABNORMAL LOW (ref 98–111)
Creatinine, Ser: 1.16 mg/dL (ref 0.61–1.24)
GFR, Estimated: >60 mL/min (ref >60)
Glucose, Bld: 276 mg/dL — ABNORMAL HIGH (ref 70–99)
Potassium: 4.4 mmol/L (ref 3.5–5.1)
Sodium: 133 mmol/L — ABNORMAL LOW (ref 135–145)

## 2020-10-11 MED ORDER — CEPHALEXIN 500 MG PO CAPS: 500.0000 mg | ORAL_CAPSULE | Freq: Four times a day (QID) | ORAL | 0 refills | Status: DC

## 2020-10-11 MED ORDER — CEPHALEXIN 500 MG PO CAPS
500.0000 mg | ORAL_CAPSULE | Freq: Once | ORAL | Status: AC
  Administered 2020-10-11: 500 mg via ORAL
  Filled 2020-10-11: qty 1

## 2020-10-11 NOTE — Discharge Instructions (Signed)
Take the antibiotic Keflex as directed over the next 7 days.  Sent to your pharmacy.  Should be improving in 2 days.  Follow-up with your primary care doctor as you have scheduled already for Thursday.  Return for any new or worse symptoms.

## 2020-10-11 NOTE — Telephone Encounter (Signed)
Pt called and given instructions to Go to the ER now. Pt said he would.

## 2020-10-11 NOTE — Telephone Encounter (Signed)
Pt called concerns of blood in urine & fatigue, cold in body. 1 week feeling very fatigue, sleepy more then often. He said he do not feel bad. But he is so cold inside, like he is frozen inside. Not real bad as  Stated ,but wanted to know if you would let him know what he should do. The blood he has been seeing over a week as well. He thought is may have been from medication. But He is getting so fatigue more then he notices. More in the morning; but did seen  this last night.

## 2020-10-11 NOTE — ED Provider Notes (Addendum)
[facility] EMERGENCY DEPARTMENT Provider Note   CSN: [id number] Arrival date & time: 10/11/20  1611     History Chief Complaint  Patient presents with  . Hematuria    Bruce Barrera is a 70 y.o. male.  Patient presenting with some blood in the urine for about a week.  No real pain no nausea or vomiting no flank pain no back pain.  No true dysuria.  Patient's been observing some flecks of blood.        Past Medical History:  Diagnosis Date  . AAA (abdominal aortic aneurysm) (HCC)   . Arthritis    Hips and knees  . Back pain   . Chronic systolic CHF (congestive heart failure) (HCC)   . Coronary artery disease    a. 03/2016 subacute anterior MI -> 100% prox LAD >72 hours out thus treated medically, LVEF 30-35% initially  . Essential hypertension   . GERD (gastroesophageal reflux disease)   . Hemorrhoids   . History of stroke    States "stroke" with left sided weakness 5/95 in prison in [place]  . Hyperlipidemia   . Hypothyroidism   . Ischemic cardiomyopathy   . MI (myocardial infarction) (HCC)    States "heart attack" 2/95 in prison in [place]  . Obesity   . Peptic ulcer    GIB 1994  . Sleep apnea   . Vitamin D deficiency disease 06/19/2019    Patient Active Problem List   Diagnosis Date Noted  . Nocturnal hypoxemia 04/08/2020  . Chronic diastolic heart failure (HCC) 02/03/2020  . CHF exacerbation (HCC) 12/08/2019  . Acute on chronic heart failure with preserved ejection fraction (HFpEF) (HCC) 10/09/2019  . Acute respiratory failure with hypoxia (HCC) 10/08/2019  . Ruptured abdominal aortic aneurysm (HCC) 09/09/2019  . Ruptured abdominal aortic aneurysm (AAA) (HCC) 09/09/2019  . Hypothyroidism, adult 06/19/2019  . Vitamin D deficiency disease 06/19/2019  . Diplopia 04/07/2018    Class: Chronic  . Strabismus 04/07/2018    Class: Chronic  . Obstructive sleep apnea 11/21/2017  . Non-acute transmural anterior myocardial infarction within last eight weeks 03/15/2016   . Pain in the chest   . Chest pain 03/13/2016  . COPD  GOLD 3/group D / active smoker 03/13/2016  . Exertional angina (HCC) 12/06/2010  . Coronary atherosclerosis of native coronary artery 12/06/2010  . Essential hypertension, benign 12/06/2010  . Mixed hyperlipidemia 12/06/2010  . Cigarette smoker 12/06/2010  . Morbid obesity (HCC) 12/06/2010    Past Surgical History:  Procedure Laterality Date  . ABDOMINAL AORTIC ENDOVASCULAR STENT GRAFT N/A 09/09/2019   Procedure: ABDOMINAL AORTIC ENDOVASCULAR STENT GRAFT;  Surgeon: Maeola Harman, MD;  Location: [facility] OR;  Service: Vascular;  Laterality: N/A;  . ADJUSTABLE SUTURE MANIPULATION Left 04/10/2018   Procedure: ADJUSTABLE SUTURE MANIPULATION;  Surgeon: Aura Camps, MD;  Location: [facility] OR;  Service: Ophthalmology;  Laterality: Left;  . CARDIAC CATHETERIZATION N/A 03/15/2016   Procedure: Right/Left Heart Cath and Coronary Angiography;  Surgeon: Iran Ouch, MD;  Location: [facility] INVASIVE CV LAB;  Service: Cardiovascular;  Laterality: N/A;  . COLONOSCOPY  03/23/2011   hemorrhoids  . KNEE ARTHROSCOPY     Right knee x 2  . MEDIAN RECTUS REPAIR Left 04/10/2018   Procedure: LEFT MEDIAN RECTUS REPAIR;  Surgeon: Aura Camps, MD;  Location: [facility] OR;  Service: Ophthalmology;  Laterality: Left;  Marland Kitchen MUSCLE RECESSION AND RESECTION Left 04/10/2018   Procedure: LEFT MUSCLE RECESSION;  Surgeon: Aura Camps, MD;  Location: [facility] OR;  Service:  Ophthalmology;  Laterality: Left;  . TOTAL HIP ARTHROPLASTY Right 2014       Family History  Problem Relation Age of Onset  . Hypertension Mother   . Diabetes Mother   . Heart disease Mother   . Stroke Mother   . Stroke Father   . Heart attack Brother   . Aneurysm Brother   . Cirrhosis Brother     Social History   Tobacco Use  . Smoking status: Current Every Day Smoker    Packs/day: 1.50    Years: 58.00    Pack years: 87.00    Types: Cigarettes    Start date: 05/18/1961  . Smokeless  tobacco: Never Used  . Tobacco comment: smokes 35 cigarettes a day (06/02/2020)  Vaping Use  . Vaping Use: Never used  Substance Use Topics  . Alcohol use: No  . Drug use: No    Home Medications Prior to Admission medications   Medication Sig Start Date End Date Taking? Authorizing Provider  albuterol (VENTOLIN HFA) 108 (90 Base) MCG/ACT inhaler Inhale 2 puffs into the lungs every 6 (six) hours as needed for wheezing or shortness of breath. 02/03/20  Yes Elenore Paddy, NP  aspirin 81 MG tablet Take 1 tablet (81 mg total) by mouth daily with breakfast. 12/09/19  Yes Emokpae, Courage, MD  atorvastatin (LIPITOR) 80 MG tablet Take 1 tablet (80 mg total) by mouth daily. 05/25/20  Yes Strader, [place] M, PA-C  budesonide (PULMICORT) 0.25 MG/2ML nebulizer solution Take 2 mLs (0.25 mg total) by nebulization in the morning and at bedtime. 03/03/20  Yes Elenore Paddy, NP  calcium-vitamin D (OSCAL WITH D) 500-200 MG-UNIT tablet Take 1 tablet by mouth daily with breakfast.   Yes [provider]  carvedilol (COREG) 12.5 MG tablet Take 1 tablet (12.5 mg total) by mouth 2 (two) times daily with a meal. 05/25/20  Yes Strader, [place] M, PA-C  cephALEXin (KEFLEX) 500 MG capsule Take 1 capsule (500 mg total) by mouth 4 (four) times daily. 10/11/20  Yes Vanetta Mulders, MD  Cholecalciferol (VITAMIN D3) 50 MCG (2000 UT) TABS Take 1 tablet by mouth daily.   Yes [provider]  clopidogrel (PLAVIX) 75 MG tablet Take 1 tablet (75 mg total) by mouth daily. 05/25/20  Yes Strader, Lennart Pall, PA-C  cyanocobalamin 100 MCG tablet Take 100 mcg by mouth daily.   Yes [provider]  ENTRESTO 49-51 MG TAKE (1) TABLET BY MOUTH TWICE DAILY. Patient taking differently: Take 1 tablet by mouth 2 (two) times daily. 06/19/20  Yes Gosrani, Nimish C, MD  formoterol (PERFOROMIST) 20 MCG/2ML nebulizer solution Take 2 mLs (20 mcg total) by nebulization 2 (two) times daily. 03/03/20  Yes Elenore Paddy, NP   furosemide (LASIX) 40 MG tablet Take 1.5 tablets (60 mg total) by mouth daily. 12/30/19 06/02/20 Yes Strader, Lennart Pall, PA-C  levothyroxine (SYNTHROID) 100 MCG tablet TAKE (1) TABLET BY MOUTH ONCE DAILY. Patient taking differently: Take 100 mcg by mouth daily before breakfast. 09/21/20  Yes Gosrani, Nimish C, MD  pantoprazole (PROTONIX) 40 MG tablet TAKE (1) TABLET BY MOUTH AT BEDTIME. Patient taking differently: Take 40 mg by mouth at bedtime. TAKE (1) TABLET BY MOUTH AT BEDTIME. 07/06/20  Yes Gosrani, Nimish C, MD  potassium chloride SA (KLOR-CON) 20 MEQ tablet Take 1.5 tablets (30 mEq total) by mouth daily. 05/25/20  Yes Strader, Lennart Pall, PA-C  SYMBICORT 80-4.5 MCG/ACT inhaler Inhale into the lungs. 05/31/20  Yes [provider]  revefenacin (  YUPELRI) 175 MCG/3ML nebulizer solution Inhale 3 mLs (175 mcg total) into the lungs every morning. Patient not taking: Reported on 10/11/2020 06/02/20   Nyoka Cowden, MD    Allergies    Patient has no known allergies.  Review of Systems   Review of Systems  Constitutional: Negative for chills and fever.  HENT: Negative for congestion, rhinorrhea and sore throat.   Eyes: Negative for visual disturbance.  Respiratory: Negative for cough and shortness of breath.   Cardiovascular: Negative for chest pain and leg swelling.  Gastrointestinal: Negative for abdominal pain, diarrhea, nausea and vomiting.  Genitourinary: Positive for hematuria. Negative for dysuria, flank pain, frequency, penile swelling, scrotal swelling and testicular pain.  Musculoskeletal: Negative for back pain and neck pain.  Skin: Negative for rash.  Neurological: Negative for dizziness, light-headedness and headaches.  Hematological: Does not bruise/bleed easily.  Psychiatric/Behavioral: Negative for confusion.    Physical Exam Updated Vital Signs BP (!) 149/86 (BP Location: Left Arm)   Pulse 77   Temp 98.2 F (36.8 C) (Oral)   Resp 20   SpO2 95%   Physical  Exam Vitals and nursing note reviewed.  Constitutional:      Appearance: Normal appearance. He is well-developed and well-nourished.  HENT:     Head: Normocephalic and atraumatic.  Eyes:     Extraocular Movements: Extraocular movements intact.     Conjunctiva/sclera: Conjunctivae normal.     Pupils: Pupils are equal, round, and reactive to light.  Cardiovascular:     Rate and Rhythm: Normal rate and regular rhythm.     Heart sounds: No murmur heard.   Pulmonary:     Effort: Pulmonary effort is normal. No respiratory distress.     Breath sounds: Normal breath sounds.  Abdominal:     Palpations: Abdomen is soft.     Tenderness: There is no abdominal tenderness.  Genitourinary:    Penis: Normal.   Musculoskeletal:        General: No edema. Normal range of motion.     Cervical back: Normal range of motion and neck supple.  Skin:    General: Skin is warm and dry.     Capillary Refill: Capillary refill takes less than 2 seconds.  Neurological:     General: No focal deficit present.     Mental Status: He is alert and oriented to person, place, and time.     Cranial Nerves: No cranial nerve deficit.     Sensory: No sensory deficit.     Motor: No weakness.  Psychiatric:        Mood and Affect: Mood and affect normal.     ED Results / Procedures / Treatments   Labs (all labs ordered are listed, but only abnormal results are displayed) Labs Reviewed  URINALYSIS, ROUTINE W REFLEX MICROSCOPIC - Abnormal; Notable for the following components:      Result Value   APPearance CLOUDY (*)    Glucose, UA >=500 (*)    Hgb urine dipstick LARGE (*)    Ketones, ur 5 (*)    Protein, ur 30 (*)    Nitrite POSITIVE (*)    Leukocytes,Ua LARGE (*)    RBC / HPF >50 (*)    WBC, UA >50 (*)    Bacteria, UA MANY (*)    All other components within normal limits  BASIC METABOLIC PANEL - Abnormal; Notable for the following components:   Sodium 133 (*)    Chloride 97 (*)    Glucose, Bld  276 (*)     Calcium 8.7 (*)    All other components within normal limits  CBC - Abnormal; Notable for the following components:   MCV 102.0 (*)    Platelets 144 (*)    All other components within normal limits  URINE CULTURE    EKG None  Radiology No results found.  Procedures Procedures   Medications Ordered in ED Medications  cephALEXin (KEFLEX) capsule 500 mg (500 mg Oral Given 10/11/20 2149)    ED Course  I have reviewed the triage vital signs and the nursing notes.  Pertinent labs & imaging results that were available during my care of the patient were reviewed by me and considered in my medical decision making (see chart for details).    MDM Rules/Calculators/A&P                         Urine consistent with urinary tract infection.  Urine sent for culture.  First dose of Keflex provided here will continue him on Keflex.  Has follow-up already built in with his primary care doctor for Thursday.  Patient is expected to improve over the next couple days he will return for any new or worse symptoms.  Patient nontoxic no acute distress.  Nothing that seems to be consistent with kidney stone.    Final Clinical Impression(s) / ED Diagnoses Final diagnoses:  Acute cystitis with hematuria    Rx / DC Orders ED Discharge Orders         Ordered    cephALEXin (KEFLEX) 500 MG capsule  4 times daily        10/11/20 2152           Vanetta Mulders, MD 10/11/20 2154    Vanetta Mulders, MD 10/11/20 2155

## 2020-10-11 NOTE — ED Notes (Signed)
Attempted to hook patient up to monitor. Patient stated he would prefer not to be hooked up. Education provided to patient. Pt verbalized understanding and states he prefers not to be hooked up at this time.

## 2020-10-11 NOTE — Telephone Encounter (Signed)
If he is getting hematuria, he may be losing a lot of blood and getting very anemic.  I would recommend that he goes to the emergency room immediately.

## 2020-10-11 NOTE — ED Triage Notes (Signed)
Blood in urine for the past week

## 2020-10-13 LAB — URINE CULTURE

## 2020-10-14 ENCOUNTER — Ambulatory Visit (INDEPENDENT_AMBULATORY_CARE_PROVIDER_SITE_OTHER): Payer: Medicare Other | Admitting: Nurse Practitioner

## 2020-10-14 ENCOUNTER — Other Ambulatory Visit: Payer: Self-pay

## 2020-10-14 ENCOUNTER — Encounter (INDEPENDENT_AMBULATORY_CARE_PROVIDER_SITE_OTHER): Payer: Self-pay | Admitting: Nurse Practitioner

## 2020-10-14 ENCOUNTER — Other Ambulatory Visit (INDEPENDENT_AMBULATORY_CARE_PROVIDER_SITE_OTHER): Payer: Self-pay | Admitting: Nurse Practitioner

## 2020-10-14 VITALS — BP 158/100 | HR 89 | Temp 97.9°F | Ht 71.0 in | Wt 274.0 lb

## 2020-10-14 DIAGNOSIS — E559 Vitamin D deficiency, unspecified: Secondary | ICD-10-CM

## 2020-10-14 DIAGNOSIS — R21 Rash and other nonspecific skin eruption: Secondary | ICD-10-CM

## 2020-10-14 DIAGNOSIS — R319 Hematuria, unspecified: Secondary | ICD-10-CM | POA: Diagnosis not present

## 2020-10-14 DIAGNOSIS — I5032 Chronic diastolic (congestive) heart failure: Secondary | ICD-10-CM | POA: Diagnosis not present

## 2020-10-14 DIAGNOSIS — E039 Hypothyroidism, unspecified: Secondary | ICD-10-CM | POA: Diagnosis not present

## 2020-10-14 DIAGNOSIS — I10 Essential (primary) hypertension: Secondary | ICD-10-CM

## 2020-10-14 DIAGNOSIS — L989 Disorder of the skin and subcutaneous tissue, unspecified: Secondary | ICD-10-CM | POA: Diagnosis not present

## 2020-10-14 MED ORDER — SPIRONOLACTONE 25 MG PO TABS: 12.5000 mg | ORAL_TABLET | Freq: Every day | ORAL | 0 refills | Status: DC

## 2020-10-14 MED ORDER — TRIAMCINOLONE ACETONIDE 0.025 % EX OINT: 1.0000 "application " | TOPICAL_OINTMENT | Freq: Two times a day (BID) | CUTANEOUS | 0 refills | Status: AC

## 2020-10-14 MED ORDER — POTASSIUM CHLORIDE CRYS ER 10 MEQ PO TBCR: 10.0000 meq | EXTENDED_RELEASE_TABLET | Freq: Every day | ORAL | 1 refills | Status: DC

## 2020-10-14 NOTE — Progress Notes (Signed)
This encounter was created in error - please disregard.

## 2020-10-14 NOTE — Progress Notes (Addendum)
Subjective:  Patient ID: Bruce Barrera, male    DOB: [birth year]  Age: 70 y.o. MRN: [id number]  CC:  Chief Complaint  Patient presents with  . Follow-up    Not doing well, passing blood in urine and started last week, went to ER, just used restroom and still blood in urine, has a lump in left groin that is growing, check both elbows, patient is extremely weak and having SOB due to COPD  . Congestive Heart Failure  . Hypertension  . Hypothyroidism  . Other    Vitamin D deficiency, hematuria, skin lesion, skin changes to bilateral elbows      HPI  This patient arrives today for the above.  CHF, hypertension: He continues to follow with cardiology for management of his CHF and hypertension.  He continues on his chronic medications including carvedilol, Entresto, furosemide, and potassium supplementation.  He tells me is tolerating medications well.  Hypothyroidism: He continues on levothyroxine and is due to have his TSH checked today.  Vitamin D deficiency: He continues on vitamin D supplement and is due to have serum check today.  Hematuria: He was seen in the ER on 10/11/2020 for hematuria.  He tells me that his symptoms initially started that day and he was experiencing blood clots in his urine.  He denies any dysuria or fevers.  ER work-up found that he probably has a urinary tract infection has been started on Keflex.  He tells me that he still has about 4-5 more days of the Keflex to go.  He tells me since starting the Keflex the blood is starting to clear up.  He still notices some blood but it does not appear to be clotting nor as frequent.  He still denies any pain.  Skin lesion: He has a lesion on his skin located to his left groin.  He tells me that that has been present for a year but it seems to be growing.  He is very concerned that it could be cancer.  He does not report any pain but he would like for it to be evaluated.  Skin changes to bilateral elbows: He  reports some dry skin on his elbows with some mild itching.  He has used Vaseline intermittently but not on a regular basis and is running what else he can take for the treatment of this.  Past Medical History:  Diagnosis Date  . AAA (abdominal aortic aneurysm) ([place])   . Arthritis    Hips and knees  . Back pain   . Chronic systolic CHF (congestive heart failure) ([place])   . Coronary artery disease    a. 03/2016 subacute anterior MI -> 100% prox LAD >72 hours out thus treated medically, LVEF 30-35% initially  . Essential hypertension   . GERD (gastroesophageal reflux disease)   . Hemorrhoids   . History of stroke    States "stroke" with left sided weakness 5/95 in prison in [place]  . Hyperlipidemia   . Hypothyroidism   . Ischemic cardiomyopathy   . MI (myocardial infarction) ([place])    States "heart attack" 2/95 in prison in [place]  . Obesity   . Peptic ulcer    GIB 1994  . Sleep apnea   . Vitamin D deficiency disease 06/19/2019      Family History  Problem Relation Age of Onset  . Hypertension Mother   . Diabetes Mother   . Heart disease Mother   . Stroke Mother   . Stroke  Father   . Heart attack Brother   . Aneurysm Brother   . Cirrhosis Brother     Social History   Social History Narrative   Lives with girlfriend for last 8 years.On disability .   Social History   Tobacco Use  . Smoking status: Current Every Day Smoker    Packs/day: 1.50    Years: 58.00    Pack years: 87.00    Types: Cigarettes    Start date: 05/18/1961  . Smokeless tobacco: Never Used  . Tobacco comment: smokes 35 cigarettes a day (06/02/2020)  Substance Use Topics  . Alcohol use: No     Current Meds  Medication Sig  . albuterol (VENTOLIN HFA) 108 (90 Base) MCG/ACT inhaler Inhale 2 puffs into the lungs every 6 (six) hours as needed for wheezing or shortness of breath.  Marland Kitchen aspirin 81 MG tablet Take 1 tablet (81 mg total) by mouth daily with breakfast.  . atorvastatin (LIPITOR) 80 MG tablet Take 1  tablet (80 mg total) by mouth daily.  . budesonide (PULMICORT) 0.25 MG/2ML nebulizer solution Take 2 mLs (0.25 mg total) by nebulization in the morning and at bedtime.  . calcium-vitamin D (OSCAL WITH D) 500-200 MG-UNIT tablet Take 1 tablet by mouth daily with breakfast.  . carvedilol (COREG) 12.5 MG tablet Take 1 tablet (12.5 mg total) by mouth 2 (two) times daily with a meal.  . cephALEXin (KEFLEX) 500 MG capsule Take 1 capsule (500 mg total) by mouth 4 (four) times daily.  . Cholecalciferol (VITAMIN D3) 50 MCG (2000 UT) TABS Take 1 tablet by mouth daily.  . clopidogrel (PLAVIX) 75 MG tablet Take 1 tablet (75 mg total) by mouth daily.  . cyanocobalamin 100 MCG tablet Take 100 mcg by mouth daily.  Marland Kitchen ENTRESTO 49-51 MG TAKE (1) TABLET BY MOUTH TWICE DAILY. (Patient taking differently: Take 1 tablet by mouth 2 (two) times daily.)  . formoterol (PERFOROMIST) 20 MCG/2ML nebulizer solution Take 2 mLs (20 mcg total) by nebulization 2 (two) times daily.  Marland Kitchen levothyroxine (SYNTHROID) 100 MCG tablet TAKE (1) TABLET BY MOUTH ONCE DAILY. (Patient taking differently: Take 100 mcg by mouth daily before breakfast.)  . pantoprazole (PROTONIX) 40 MG tablet TAKE (1) TABLET BY MOUTH AT BEDTIME. (Patient taking differently: Take 40 mg by mouth at bedtime. TAKE (1) TABLET BY MOUTH AT BEDTIME.)  . potassium chloride SA (KLOR-CON) 20 MEQ tablet Take 1.5 tablets (30 mEq total) by mouth daily.  . SYMBICORT 80-4.5 MCG/ACT inhaler Inhale into the lungs.  . triamcinolone (KENALOG) 0.025 % ointment Apply 1 application topically 2 (two) times daily.    ROS:  Review of Systems  Constitutional: Negative for chills and fever.  Respiratory: Positive for shortness of breath (chronic).   Cardiovascular: Negative for chest pain.  Genitourinary: Positive for hematuria. Negative for dysuria.     Objective:   Today's Vitals: BP (!) 158/100   Pulse 89   Temp 97.9 F (36.6 C) (Temporal)   Ht $R'5\' 11"'Rf$  (1.803 m)   Wt 274 lb  (124.3 kg)   SpO2 95%   BMI 38.22 kg/m  Vitals with BMI 10/14/2020 10/11/2020 10/11/2020  Height $Remov'5\' 11"'FsMoXy$  - -  Weight 274 lbs - -  BMI 44.96 - -  Systolic 759 163 846  Diastolic 659 88 86  Pulse 89 79 77     Physical Exam Vitals reviewed.  Constitutional:      Appearance: Normal appearance.  HENT:     Head: Normocephalic and atraumatic.  Cardiovascular:     Rate and Rhythm: Normal rate and regular rhythm.  Pulmonary:     Effort: Pulmonary effort is normal.     Breath sounds: Normal breath sounds.  Musculoskeletal:     Cervical back: Neck supple.  Skin:    General: Skin is warm and dry.       Neurological:     Mental Status: He is alert and oriented to person, place, and time.  Psychiatric:        Mood and Affect: Mood normal.        Behavior: Behavior normal.        Thought Content: Thought content normal.        Judgment: Judgment normal.          Assessment and Plan   1. Essential hypertension, benign   2. Rash   3. Hypothyroidism, adult   4. Vitamin D deficiency disease   5. Skin lesion   6. Chronic diastolic heart failure ([place])   7. Hematuria, unspecified type      Plan: 1.,  6.  He is encouraged to follow-up with his cardiologist as scheduled.  He will see them later this month.  I will reach out to his cardiologist to see what recommendations are regarding medication adjustment to improve patient's blood pressure as it is above goal.  Not sure if increasing the patient's Entresto versus carvedilol is the preferred way to go, so I will have staff call the patient once have heard back from Dr. Domenic Polite.   2.  I recommend the patient consider using Vaseline or Aquaphor on his skin twice a day and if symptoms persist or worsen to try triamcinolone cream which I will prescribe and sent to his pharmacy today. 3.  We will check blood work for further evaluation today. 4.  We will check blood work for further evaluation today. 5.  Not sure of etiology of skin  lesion but it is raised with irregular borders and it is very dark, will refer urgently to dermatology for assistance with evaluating and managing the skin lesion. 7.  Appears to be improving, will have him come back to the office in about 10 days at which point we will repeat urinalysis to make sure his infection and bleeding has resolved.   Tests ordered Orders Placed This Encounter  Procedures  . Vitamin D, 25-hydroxy  . CMP with eGFR([facility])  . TSH  . Ambulatory referral to Dermatology      Meds ordered this encounter  Medications  . triamcinolone (KENALOG) 0.025 % ointment    Sig: Apply 1 application topically 2 (two) times daily.    Dispense:  30 g    Refill:  0    Order Specific Question:   Supervising Provider    Answer:   Doree Albee [[id number]]    Patient to follow-up in 7 to 10 days or sooner as needed.  Ailene Ards, NP   Update: I did hear back from patient's cardiologist (Dr. Domenic Polite).  He recommended the patient start a low-dose of spironolactone (12.5 mg daily) for treatment of his hypertension considering that he has cardiomyopathy.  He also recommended the patient reduce his intake of potassium from 30 mEq daily down to 10 mEq daily.  I have called the patient and I have told him about these medication changes and that if he were to feel unwell at any time to let me know.  I have told him he will take 1/2 tablet of  spironolactone by mouth daily.  I recommended he stop using the potassium supplement he has currently and I have sent a prescription for new potassium supplement with the correct recommended dose and he will take 1 tablet by mouth daily.   Patient is scheduled in 1 week for repeat lab draw at which point he will have his CMP checked as well as his urine checked.

## 2020-10-14 NOTE — Addendum Note (Signed)
Addended by: Elenore Paddy on: 10/14/2020 04:31 PM   Modules accepted: Orders

## 2020-10-15 ENCOUNTER — Other Ambulatory Visit (INDEPENDENT_AMBULATORY_CARE_PROVIDER_SITE_OTHER): Payer: Self-pay | Admitting: Nurse Practitioner

## 2020-10-15 DIAGNOSIS — I10 Essential (primary) hypertension: Secondary | ICD-10-CM

## 2020-10-15 LAB — COMPLETE METABOLIC PANEL WITH GFR
AG Ratio: 1.4 (calc) (ref 1.0–2.5)
ALT: 14 U/L (ref 9–46)
AST: 13 U/L (ref 10–35)
Albumin: 3.9 g/dL (ref 3.6–5.1)
Alkaline phosphatase (APISO): 135 U/L (ref 35–144)
BUN: 13 mg/dL (ref 7–25)
CO2: 31 mmol/L (ref 20–32)
Calcium: 9.3 mg/dL (ref 8.6–10.3)
Chloride: 102 mmol/L (ref 98–110)
Creat: 1.01 mg/dL (ref 0.70–1.25)
GFR, Est African American: 88 mL/min/{1.73_m2} (ref >=60)
GFR, Est Non African American: 76 mL/min/{1.73_m2} (ref >=60)
Globulin: 2.8 g/dL (calc) (ref 1.9–3.7)
Glucose, Bld: 145 mg/dL — ABNORMAL HIGH (ref 65–139)
Potassium: 4.3 mmol/L (ref 3.5–5.3)
Sodium: 139 mmol/L (ref 135–146)
Total Bilirubin: 0.5 mg/dL (ref 0.2–1.2)
Total Protein: 6.7 g/dL (ref 6.1–8.1)

## 2020-10-15 LAB — TSH: TSH: 2.46 mIU/L (ref 0.40–4.50)

## 2020-10-15 LAB — VITAMIN D 25 HYDROXY (VIT D DEFICIENCY, FRACTURES): Vit D, 25-Hydroxy: 60 ng/mL (ref 30–100)

## 2020-10-19 DIAGNOSIS — L84 Corns and callosities: Secondary | ICD-10-CM | POA: Diagnosis not present

## 2020-10-19 DIAGNOSIS — I70203 Unspecified atherosclerosis of native arteries of extremities, bilateral legs: Secondary | ICD-10-CM | POA: Diagnosis not present

## 2020-10-19 DIAGNOSIS — J449 Chronic obstructive pulmonary disease, unspecified: Secondary | ICD-10-CM | POA: Diagnosis not present

## 2020-10-19 DIAGNOSIS — M79676 Pain in unspecified toe(s): Secondary | ICD-10-CM | POA: Diagnosis not present

## 2020-10-19 DIAGNOSIS — B351 Tinea unguium: Secondary | ICD-10-CM | POA: Diagnosis not present

## 2020-10-21 ENCOUNTER — Other Ambulatory Visit: Payer: Self-pay

## 2020-10-21 ENCOUNTER — Other Ambulatory Visit (INDEPENDENT_AMBULATORY_CARE_PROVIDER_SITE_OTHER): Payer: Medicare Other

## 2020-10-21 ENCOUNTER — Other Ambulatory Visit (INDEPENDENT_AMBULATORY_CARE_PROVIDER_SITE_OTHER): Payer: Self-pay | Admitting: Nurse Practitioner

## 2020-10-21 DIAGNOSIS — I10 Essential (primary) hypertension: Secondary | ICD-10-CM | POA: Diagnosis not present

## 2020-10-21 DIAGNOSIS — R739 Hyperglycemia, unspecified: Secondary | ICD-10-CM

## 2020-10-22 LAB — COMPLETE METABOLIC PANEL WITH GFR
AG Ratio: 1.3 (calc) (ref 1.0–2.5)
ALT: 12 U/L (ref 9–46)
AST: 12 U/L (ref 10–35)
Albumin: 3.8 g/dL (ref 3.6–5.1)
Alkaline phosphatase (APISO): 140 U/L (ref 35–144)
BUN: 14 mg/dL (ref 7–25)
CO2: 34 mmol/L — ABNORMAL HIGH (ref 20–32)
Calcium: 9 mg/dL (ref 8.6–10.3)
Chloride: 101 mmol/L (ref 98–110)
Creat: 1.08 mg/dL (ref 0.70–1.25)
GFR, Est African American: 81 mL/min/{1.73_m2} (ref >=60)
GFR, Est Non African American: 70 mL/min/{1.73_m2} (ref >=60)
Globulin: 2.9 g/dL (calc) (ref 1.9–3.7)
Glucose, Bld: 194 mg/dL — ABNORMAL HIGH (ref 65–139)
Potassium: 4.7 mmol/L (ref 3.5–5.3)
Sodium: 139 mmol/L (ref 135–146)
Total Bilirubin: 0.6 mg/dL (ref 0.2–1.2)
Total Protein: 6.7 g/dL (ref 6.1–8.1)

## 2020-10-22 LAB — HEMOGLOBIN A1C
Hgb A1c MFr Bld: 7.9 % of total Hgb — ABNORMAL HIGH (ref <5.7)
Mean Plasma Glucose: 180 mg/dL
eAG (mmol/L): 10 mmol/L

## 2020-10-27 ENCOUNTER — Ambulatory Visit (INDEPENDENT_AMBULATORY_CARE_PROVIDER_SITE_OTHER): Payer: Medicare Other | Admitting: Cardiology

## 2020-10-27 ENCOUNTER — Encounter: Payer: Self-pay | Admitting: Cardiology

## 2020-10-27 ENCOUNTER — Other Ambulatory Visit: Payer: Self-pay

## 2020-10-27 VITALS — BP 190/110 | HR 92 | Ht 71.0 in | Wt 273.0 lb

## 2020-10-27 DIAGNOSIS — I25119 Atherosclerotic heart disease of native coronary artery with unspecified angina pectoris: Secondary | ICD-10-CM | POA: Diagnosis not present

## 2020-10-27 DIAGNOSIS — E782 Mixed hyperlipidemia: Secondary | ICD-10-CM

## 2020-10-27 DIAGNOSIS — I10 Essential (primary) hypertension: Secondary | ICD-10-CM | POA: Diagnosis not present

## 2020-10-27 MED ORDER — SPIRONOLACTONE 25 MG PO TABS: 25.0000 mg | ORAL_TABLET | Freq: Every day | ORAL | 3 refills | Status: DC

## 2020-10-27 NOTE — Patient Instructions (Addendum)
Medication Instructions:  INCREASE Aldactone to 25 mg daily   *If you need a refill on your cardiac medications before your next appointment, please call your pharmacy*   Lab Work: None today If you have labs (blood work) drawn today and your tests are completely normal, you will receive your results only by: Marland Kitchen MyChart Message (if you have MyChart) OR . A paper copy in the mail If you have any lab test that is abnormal or we need to change your treatment, we will call you to review the results.   Testing/Procedures: None today   Follow-Up: At [facility], you and your health needs are our priority.  As part of our continuing mission to provide you with exceptional heart care, we have created designated Provider Care Teams.  These Care Teams include your primary Cardiologist (physician) and Advanced Practice Providers (APPs -  Physician Assistants and Nurse Practitioners) who all work together to provide you with the care you need, when you need it.  We recommend signing up for the patient portal called "MyChart".  Sign up information is provided on this After Visit Summary.  MyChart is used to connect with patients for Virtual Visits (Telemedicine).  Patients are able to view lab/test results, encounter notes, upcoming appointments, etc.  Non-urgent messages can be sent to your provider as well.   To learn more about what you can do with MyChart, go to [email or web address].    Your next appointment:   6 month(s) with Doctor, 2 weeks with Nurse  The format for your next appointment:   In Person  Provider:   Nona Dell, MD   Other Instructions Keep daily blood pressure log and bring to nurse appointment.      Thank you for choosing [facility] !

## 2020-10-27 NOTE — Progress Notes (Signed)
Cardiology Office Note  Date: 10/27/2020   ID: Bruce Barrera, Bruce Barrera 1951/09/05, MRN [id number]  PCP:  Wilson Singer, MD  Cardiologist:  Nona Dell, MD Electrophysiologist:  None   Chief Complaint  Patient presents with  . Cardiac follow-up    History of Present Illness: Bruce Barrera is a 70 y.o. male last seen in September 2021 by Ms. Strader PA-C.  He presents for a routine visit.  Blood pressure is significantly elevated today, he tells me that he did not take any of his medications this morning.  He was mainly concerned about urinating too much while he was out doing errands.  I reinforced with him to take all of his regular medications with the exception of holding Lasix if he does have something to do in the morning, then take the dose when he gets back home.  He states that his wife does check his blood pressure at home, cannot recall any of the recent measurements.  He did have a visit with PCP recently, started on Aldactone for better blood pressure control.  Blood pressure was 158/100 at that time.  I reviewed his recent lab work as noted below.  He does not describe any obvious angina symptoms at this time.  No abdominal pain.  His weight is stable.  He continues to follow with Dr. Randie Heinz with [facility], history of endovascular aneurysm repair for ruptured AAA.   Past Medical History:  Diagnosis Date  . AAA (abdominal aortic aneurysm) ([facility])   . Arthritis    Hips and knees  . Back pain   . Chronic systolic CHF (congestive heart failure) ([facility])   . Coronary artery disease    a. 03/2016 subacute anterior MI -> 100% prox LAD >72 hours out thus treated medically, LVEF 30-35% initially  . Essential hypertension   . GERD (gastroesophageal reflux disease)   . Hemorrhoids   . History of stroke    States "stroke" with left sided weakness 5/95 in prison in [place]  . Hyperlipidemia   . Hypothyroidism   . Ischemic cardiomyopathy   . MI (myocardial infarction) ([facility])     States "heart attack" 2/95 in prison in [place]  . Obesity   . Peptic ulcer    GIB 1994  . Sleep apnea   . Vitamin D deficiency disease 06/19/2019    Past Surgical History:  Procedure Laterality Date  . ABDOMINAL AORTIC ENDOVASCULAR STENT GRAFT N/A 09/09/2019   Procedure: ABDOMINAL AORTIC ENDOVASCULAR STENT GRAFT;  Surgeon: Maeola Harman, MD;  Location: [facility] OR;  Service: Vascular;  Laterality: N/A;  . ADJUSTABLE SUTURE MANIPULATION Left 04/10/2018   Procedure: ADJUSTABLE SUTURE MANIPULATION;  Surgeon: Aura Camps, MD;  Location: [facility] OR;  Service: Ophthalmology;  Laterality: Left;  . CARDIAC CATHETERIZATION N/A 03/15/2016   Procedure: Right/Left Heart Cath and Coronary Angiography;  Surgeon: Iran Ouch, MD;  Location: [facility] INVASIVE CV LAB;  Service: Cardiovascular;  Laterality: N/A;  . COLONOSCOPY  03/23/2011   hemorrhoids  . KNEE ARTHROSCOPY     Right knee x 2  . MEDIAN RECTUS REPAIR Left 04/10/2018   Procedure: LEFT MEDIAN RECTUS REPAIR;  Surgeon: Aura Camps, MD;  Location: [facility] OR;  Service: Ophthalmology;  Laterality: Left;  Marland Kitchen MUSCLE RECESSION AND RESECTION Left 04/10/2018   Procedure: LEFT MUSCLE RECESSION;  Surgeon: Aura Camps, MD;  Location: [facility] OR;  Service: Ophthalmology;  Laterality: Left;  . TOTAL HIP ARTHROPLASTY Right 2014    Current Outpatient Medications  Medication Sig Dispense  Refill  . albuterol (VENTOLIN HFA) 108 (90 Base) MCG/ACT inhaler Inhale 2 puffs into the lungs every 6 (six) hours as needed for wheezing or shortness of breath. 8 g 3  . aspirin 81 MG tablet Take 1 tablet (81 mg total) by mouth daily with breakfast. 30 tablet 2  . atorvastatin (LIPITOR) 80 MG tablet Take 1 tablet (80 mg total) by mouth daily. 90 tablet 3  . budesonide (PULMICORT) 0.25 MG/2ML nebulizer solution Take 2 mLs (0.25 mg total) by nebulization in the morning and at bedtime. 60 mL 3  . calcium-vitamin D (OSCAL WITH D) 500-200 MG-UNIT tablet Take 1 tablet by mouth daily  with breakfast.    . carvedilol (COREG) 12.5 MG tablet Take 1 tablet (12.5 mg total) by mouth 2 (two) times daily with a meal. 180 tablet 3  . Cholecalciferol (VITAMIN D3) 50 MCG (2000 UT) TABS Take 1 tablet by mouth daily.    . clopidogrel (PLAVIX) 75 MG tablet Take 1 tablet (75 mg total) by mouth daily. 90 tablet 3  . cyanocobalamin 100 MCG tablet Take 100 mcg by mouth daily.    Marland Kitchen ENTRESTO 49-51 MG TAKE (1) TABLET BY MOUTH TWICE DAILY. (Patient taking differently: Take 1 tablet by mouth 2 (two) times daily.) 60 tablet 3  . formoterol (PERFOROMIST) 20 MCG/2ML nebulizer solution Take 2 mLs (20 mcg total) by nebulization 2 (two) times daily. 120 mL 3  . furosemide (LASIX) 40 MG tablet Take 1.5 tablets (60 mg total) by mouth daily. 135 tablet 3  . levothyroxine (SYNTHROID) 100 MCG tablet TAKE (1) TABLET BY MOUTH ONCE DAILY. (Patient taking differently: Take 100 mcg by mouth daily before breakfast.) 30 tablet 0  . pantoprazole (PROTONIX) 40 MG tablet TAKE (1) TABLET BY MOUTH AT BEDTIME. (Patient taking differently: Take 40 mg by mouth at bedtime. TAKE (1) TABLET BY MOUTH AT BEDTIME.) 30 tablet 0  . potassium chloride SA (KLOR-CON) 10 MEQ tablet Take 1 tablet (10 mEq total) by mouth daily. 90 tablet 1  . spironolactone (ALDACTONE) 25 MG tablet Take 1 tablet (25 mg total) by mouth daily. 90 tablet 3  . SYMBICORT 80-4.5 MCG/ACT inhaler Inhale into the lungs.    . triamcinolone (KENALOG) 0.025 % ointment Apply 1 application topically 2 (two) times daily. 30 g 0   No current facility-administered medications for this visit.   Allergies:  Patient has no known allergies.   ROS: No palpitations or syncope.  Physical Exam: VS:  BP (!) 190/110 Comment: has not taken meds today  Pulse 92   Ht 5\' 11"  (1.803 m)   Wt 273 lb (123.8 kg)   SpO2 95%   BMI 38.08 kg/m , BMI Body mass index is 38.08 kg/m.  Wt Readings from Last 3 Encounters:  10/27/20 273 lb (123.8 kg)  10/14/20 274 lb (124.3 kg)  06/04/20  270 lb (122.5 kg)    General: Morbidly obese male, appears comfortable at rest. HEENT: Conjunctiva and lids normal, wearing a mask. Neck: Supple, no elevated JVP or carotid bruits, no thyromegaly. Lungs: Clear to auscultation, nonlabored breathing at rest. Cardiac: Regular rate and rhythm, no S3 or significant systolic murmur, no pericardial rub. Abdomen: Protuberant, nontender, bowel sounds present. Extremities: Mild lower leg edema.  ECG:  An ECG dated 12/07/2019 was personally reviewed today and demonstrated:  Sinus tachycardia with PAC, probable left atrial enlargement, left anterior fascicular block, old anterior infarct pattern.  Recent Labwork: 12/08/2019: B Natriuretic Peptide 994.0; Magnesium 2.0 10/11/2020: Hemoglobin 16.1; Platelets 144  10/14/2020: TSH 2.46 10/21/2020: ALT 12; AST 12; BUN 14; Creat 1.08; Potassium 4.7; Sodium 139     Component Value Date/Time   CHOL 129 02/03/2020 1013   TRIG 111 02/03/2020 1013   HDL 34 (L) 02/03/2020 1013   CHOLHDL 3.8 02/03/2020 1013   LDLCALC 76 02/03/2020 1013    Other Studies Reviewed Today:  Echocardiogram 10/09/2019: 1. Left ventricular ejection fraction, by visual estimation, is 50 to  55%. The left ventricle has mildly decreased function. Left ventricular  septal wall thickness was mildly increased. Mildly increased left  ventricular posterior wall thickness. There  is mildly increased left ventricular hypertrophy.  2. Definity contrast agent was given IV to delineate the left ventricular  endocardial borders.  3. Elevated left ventricular end-diastolic pressure.  4. The left ventricle demonstrates regional wall motion abnormalities.  5. Apical anterior apical septal, and apical hypokinesis.  6. Global right ventricle has normal systolic function.The right  ventricular size is normal. No increase in right ventricular wall  thickness.  7. Left atrial size was normal.  8. Right atrial size was normal.  9. The mitral  valve is normal in structure. Trivial mitral valve  regurgitation. No evidence of mitral stenosis.  10. The tricuspid valve is normal in structure.  11. The tricuspid valve is normal in structure. Tricuspid valve  regurgitation is trivial.  12. The aortic valve is normal in structure. Aortic valve regurgitation is  not visualized. No evidence of aortic valve sclerosis or stenosis.  13. The pulmonic valve was normal in structure. Pulmonic valve  regurgitation is not visualized.  14. Normal pulmonary artery systolic pressure.  15. The inferior vena cava is dilated in size with >50% respiratory  variability, suggesting right atrial pressure of 8 mmHg.   Assessment and Plan:  1.  Essential hypertension, blood pressure significantly elevated today.  He did not take his medications this morning as discussed above.  Reinforced compliance, possibly holding diuretic only on mornings when he has to be out, and then take the dose when he gets home.  Increase Aldactone to 25 mg daily.  I have asked him to record daily blood pressures for the next few weeks and then present for a nurse blood pressure check.  He may need further up titration of his standing regimen.  2.  Ischemic cardiomyopathy with improvement in LVEF to the range of 50 to 55% by assessment in January 2021.  Continue current regimen including Coreg, Entresto, Lasix, potassium supplement, and Aldactone.  I reviewed his recent lab work.  3.  AAA status post rupture December 2020 with endovascular repair and follow-up by Dr. Randie Heinz, note reviewed.  4.  Mixed hyperlipidemia, on Lipitor.  LDL 76.  Medication Adjustments/Labs and Tests Ordered: Current medicines are reviewed at length with the patient today.  Concerns regarding medicines are outlined above.   Tests Ordered: No orders of the defined types were placed in this encounter.   Medication Changes: Meds ordered this encounter  Medications  . spironolactone (ALDACTONE) 25 MG  tablet    Sig: Take 1 tablet (25 mg total) by mouth daily.    Dispense:  90 tablet    Refill:  3    10/27/20 dose increased to 25 mg qd    Disposition:  Follow up 2 weeks for nurse blood pressure check.  Signed, Jonelle Sidle, MD, [facility] 10/27/2020 11:44 AM    [facility] at [facility] [address] [place], [place] [address] Phone: [phone];  Fax: [phone]

## 2020-10-28 ENCOUNTER — Other Ambulatory Visit (INDEPENDENT_AMBULATORY_CARE_PROVIDER_SITE_OTHER): Payer: Self-pay | Admitting: Internal Medicine

## 2020-10-29 ENCOUNTER — Other Ambulatory Visit (INDEPENDENT_AMBULATORY_CARE_PROVIDER_SITE_OTHER): Payer: Self-pay | Admitting: Internal Medicine

## 2020-10-29 DIAGNOSIS — I10 Essential (primary) hypertension: Secondary | ICD-10-CM

## 2020-10-29 DIAGNOSIS — I5032 Chronic diastolic (congestive) heart failure: Secondary | ICD-10-CM

## 2020-11-01 ENCOUNTER — Other Ambulatory Visit (INDEPENDENT_AMBULATORY_CARE_PROVIDER_SITE_OTHER): Payer: Self-pay | Admitting: Nurse Practitioner

## 2020-11-01 DIAGNOSIS — J449 Chronic obstructive pulmonary disease, unspecified: Secondary | ICD-10-CM

## 2020-11-03 ENCOUNTER — Other Ambulatory Visit: Payer: Self-pay

## 2020-11-03 ENCOUNTER — Ambulatory Visit (INDEPENDENT_AMBULATORY_CARE_PROVIDER_SITE_OTHER): Payer: Medicare Other | Admitting: Nurse Practitioner

## 2020-11-03 ENCOUNTER — Telehealth (INDEPENDENT_AMBULATORY_CARE_PROVIDER_SITE_OTHER): Payer: Self-pay | Admitting: Nurse Practitioner

## 2020-11-03 ENCOUNTER — Encounter (INDEPENDENT_AMBULATORY_CARE_PROVIDER_SITE_OTHER): Payer: Self-pay | Admitting: Nurse Practitioner

## 2020-11-03 VITALS — BP 138/74 | HR 87 | Temp 97.6°F | Resp 18 | Ht 71.0 in | Wt 272.6 lb

## 2020-11-03 DIAGNOSIS — N390 Urinary tract infection, site not specified: Secondary | ICD-10-CM | POA: Diagnosis not present

## 2020-11-03 DIAGNOSIS — I10 Essential (primary) hypertension: Secondary | ICD-10-CM

## 2020-11-03 DIAGNOSIS — I5032 Chronic diastolic (congestive) heart failure: Secondary | ICD-10-CM

## 2020-11-03 DIAGNOSIS — E1165 Type 2 diabetes mellitus with hyperglycemia: Secondary | ICD-10-CM

## 2020-11-03 MED ORDER — METFORMIN HCL 500 MG PO TABS: 250.0000 mg | ORAL_TABLET | Freq: Every day | ORAL | 0 refills | Status: DC

## 2020-11-03 MED ORDER — BLOOD GLUCOSE MONITOR KIT
PACK | 0 refills | Status: AC
Start: 2020-11-03 — End: ?

## 2020-11-03 NOTE — Patient Instructions (Signed)
Metformin Tablets What is this medicine? METFORMIN (met FOR min) is used to treat type 2 diabetes. It helps to control blood sugar. Treatment is combined with diet and exercise. This medicine can be used alone or with other medicines for diabetes. This medicine may be used for other purposes; ask your health care provider or pharmacist if you have questions. COMMON BRAND NAME(S): Glucophage What should I tell my health care provider before I take this medicine? They need to know if you have any of these conditions:  anemia  dehydration  heart disease  frequently drink alcohol-containing beverages  kidney disease  liver disease  polycystic ovary syndrome  serious infection or injury  vomiting  an unusual or allergic reaction to metformin, other medicines, foods, dyes, or preservatives  pregnant or trying to get pregnant  breast-feeding How should I use this medicine? Take this medicine by mouth with a glass of water. Follow the directions on the prescription label. Take this medicine with food. Take your medicine at regular intervals. Do not take your medicine more often than directed. Do not stop taking except on your doctor's advice. Talk to your pediatrician regarding the use of this medicine in children. While this drug may be prescribed for children as young as 17 years of age for selected conditions, precautions do apply. Overdosage: If you think you have taken too much of this medicine contact a poison control center or emergency room at once. NOTE: This medicine is only for you. Do not share this medicine with others. What if I miss a dose? If you miss a dose, take it as soon as you can. If it is almost time for your next dose, take only that dose. Do not take double or extra doses. What may interact with this medicine? Do not take this medicine with any of the following medications:  certain contrast medicines given before X-rays, CT scans, MRI, or other  procedures  dofetilide This medicine may also interact with the following medications:  acetazolamide  alcohol  certain antivirals for HIV or hepatitis  certain medicines for blood pressure, heart disease, irregular heart beat  cimetidine  dichlorphenamide  digoxin  diuretics  male hormones, like estrogens or progestins and birth control pills  glycopyrrolate  isoniazid  lamotrigine  memantine  methazolamide  metoclopramide  midodrine  niacin  phenothiazines like chlorpromazine, mesoridazine, prochlorperazine, thioridazine  phenytoin  ranolazine  steroid medicines like prednisone or cortisone  stimulant medicines for attention disorders, weight loss, or to stay awake  thyroid medicines  topiramate  trospium  vandetanib  zonisamide This list may not describe all possible interactions. Give your health care provider a list of all the medicines, herbs, non-prescription drugs, or dietary supplements you use. Also tell them if you smoke, drink alcohol, or use illegal drugs. Some items may interact with your medicine. What should I watch for while using this medicine? Visit your doctor or health care professional for regular checks on your progress. A test called the HbA1C (A1C) will be monitored. This is a simple blood test. It measures your blood sugar control over the last 2 to 3 months. You will receive this test every 3 to 6 months. Learn how to check your blood sugar. Learn the symptoms of low and high blood sugar and how to manage them. Always carry a quick-source of sugar with you in case you have symptoms of low blood sugar. Examples include hard sugar candy or glucose tablets. Make sure others know that you can choke  if you eat or drink when you develop serious symptoms of low blood sugar, such as seizures or unconsciousness. They must get medical help at once. Tell your doctor or health care professional if you have high blood sugar. You might  need to change the dose of your medicine. If you are sick or exercising more than usual, you might need to change the dose of your medicine. Do not skip meals. Ask your doctor or health care professional if you should avoid alcohol. Many nonprescription cough and cold products contain sugar or alcohol. These can affect blood sugar. This medicine may cause ovulation in premenopausal women who do not have regular monthly periods. This may increase your chances of becoming pregnant. You should not take this medicine if you become pregnant or think you may be pregnant. Talk with your doctor or health care professional about your birth control options while taking this medicine. Contact your doctor or health care professional right away if you think you are pregnant. If you are going to need surgery, a MRI, CT scan, or other procedure, tell your doctor that you are taking this medicine. You may need to stop taking this medicine before the procedure. Wear a medical ID bracelet or chain, and carry a card that describes your disease and details of your medicine and dosage times. This medicine may cause a decrease in folic acid and vitamin B12. You should make sure that you get enough vitamins while you are taking this medicine. Discuss the foods you eat and the vitamins you take with your health care professional. What side effects may I notice from receiving this medicine? Side effects that you should report to your doctor or health care professional as soon as possible:  allergic reactions like skin rash, itching or hives, swelling of the face, lips, or tongue  breathing problems  feeling faint or lightheaded, falls  muscle aches or pains  signs and symptoms of low blood sugar such as feeling anxious, confusion, dizziness, increased hunger, unusually weak or tired, sweating, shakiness, cold, irritable, headache, blurred vision, fast heartbeat, loss of consciousness  slow or irregular  heartbeat  unusual stomach pain or discomfort  unusually tired or weak Side effects that usually do not require medical attention (report to your doctor or health care professional if they continue or are bothersome):  diarrhea  headache  heartburn  metallic taste in mouth  nausea  stomach gas, upset This list may not describe all possible side effects. Call your doctor for medical advice about side effects. You may report side effects to FDA at 1-800-FDA-1088. Where should I keep my medicine? Keep out of the reach of children. Store at room temperature between 15 and 30 degrees C (59 and 86 degrees F). Protect from moisture and light. Throw away any unused medicine after the expiration date. NOTE: This sheet is a summary. It may not cover all possible information. If you have questions about this medicine, talk to your doctor, pharmacist, or health care provider.  2021 Elsevier/Gold Standard (2020-07-25 10:29:07) Hypoglycemia Hypoglycemia is when the sugar (glucose) level in your blood is too low. Low blood sugar can happen to people who have diabetes and people who do not have diabetes. Low blood sugar can happen quickly, and it can be an emergency. What are the causes? This condition happens most often in people who have diabetes and may be caused by:  Diabetes medicine.  Not eating enough, or not eating often enough.  Doing more physical activity.  Drinking alcohol on an empty stomach. If you do not have diabetes, hypoglycemia may be caused by:  A tumor in the pancreas.  Not eating enough, or not eating for long periods at a time (fasting).  A very bad infection or illness.  Problems after having weight loss (bariatric) surgery.  Kidney failure or liver failure.  Certain medicines. What increases the risk? This condition is more likely to develop in people who:  Have diabetes and take medicines to lower their blood sugar.  Abuse alcohol.  Have a very bad  illness. What are the signs or symptoms? Symptoms depend on whether your low blood sugar is mild, moderate, or very low. Mild  Hunger.  Feeling worried or nervous (anxious).  Sweating and feeling clammy.  Feeling dizzy or light-headed.  Being sleepy or having trouble sleeping.  Feeling like you may vomit (nauseous).  A fast heartbeat.  A headache.  Blurry vision.  Being irritable or grouchy.  Tingling or loss of feeling (numbness) around your mouth, lips, or tongue.  Trouble with moving (coordination). Moderate  Confusion and poor judgment.  Behavior changes.  Weakness.  Uneven heartbeats. Very low Very low blood sugar (severe hypoglycemia) is a medical emergency. It can cause:  Fainting.  Jerky movements that you cannot control (seizure).  Loss of consciousness (coma).  Death. How is this treated? Treating low blood sugar Low blood sugar is often treated by eating or drinking something sugary right away. The snack should contain 15 grams of a fast-acting carb (carbohydrate). Options include:  4 oz (120 mL) of fruit juice.  4-6 oz (120-150 mL) of regular soda (not diet soda).  8 oz (240 mL) of low-fat milk.  Several pieces of hard candy. Check food labels to find out how many to eat for 15 grams.  1 Tbsp (15 mL) of sugar or honey. Treating low blood sugar if you have diabetes If you can think clearly and swallow safely, follow the 15:15 rule:  Take 15 grams of a fast-acting carb. Talk with your doctor about how much you should take.  Always keep a source of fast-acting carb with you, such as: ? Sugar tablets (glucose pills). Take 4 pills. ? Several pieces of hard candy. Check food labels to see how many pieces to eat for 15 grams. ? 4 oz (120 mL) of fruit juice. ? 4-6 oz (120-150 mL) of regular (not diet) soda. ? 1 Tbsp (15 mL) of honey or sugar.  Check your blood sugar 15 minutes after you take the carb.  If your blood sugar is still at or  below 70 mg/dL (3.9 mmol/L), take 15 grams of a carb again.  If your blood sugar does not go above 70 mg/dL (3.9 mmol/L) after 3 tries, get help right away.  After your blood sugar goes back to normal, eat a meal or a snack within 1 hour.   Treating very low blood sugar If your blood sugar is at or below 54 mg/dL (3 mmol/L), you have very low blood sugar, or severe hypoglycemia. This is an emergency. Get medical help right away. If you have very low blood sugar and you cannot eat or drink, you will need to be given a hormone called glucagon. A family member or friend should learn how to check your blood sugar and how to give you glucagon. Ask your doctor if you need to have an emergency glucagon kit at home. Very low blood sugar may also need to be treated in a hospital.  Follow these instructions at home: General instructions  Take over-the-counter and prescription medicines only as told by your doctor.  Stay aware of your blood sugar as told by your doctor.  If you drink alcohol: ? Limit how much you use to:  0-1 drink a day for nonpregnant women.  0-2 drinks a day for men. ? Be aware of how much alcohol is in your drink. In the [place] one drink equals one 12 oz bottle of beer (355 mL), one 5 oz glass of wine (148 mL), or one 1 oz glass of hard liquor (44 mL).  Keep all follow-up visits as told by your doctor. This is important. If you have diabetes:  Always have a rapid-acting carb (15 grams) option with you to treat low blood sugar.  Follow your diabetes care plan as told by your doctor. Make sure you: ? Know the symptoms of low blood sugar. ? Check your blood sugar as often as told by your doctor. Always check it before and after exercise. ? Always check your blood sugar before you drive. ? Take your medicines as told. ? Follow your meal plan. ? Eat on time. Do not skip meals.  Share your diabetes care plan with: ? Your work or school. ? People you live with.  Carry a  card or wear jewelry that says you have diabetes.   Contact a doctor if:  You have trouble keeping your blood sugar in your target range.  You have low blood sugar often. Get help right away if:  You still have symptoms after you eat or drink something that contains 15 grams of fast-acting carb and you cannot get your blood sugar above 70 mg/dL by following the 15:15 rule.  Your blood sugar is at or below 54 mg/dL (3 mmol/L).  You have a seizure.  You faint. These symptoms may be an emergency. Do not wait to see if the symptoms will go away. Get medical help right away. Call your local emergency services (911 in the [place] Do not drive yourself to the hospital. Summary  Hypoglycemia happens when the level of sugar (glucose) in your blood is too low.  Low blood sugar can happen to people who have diabetes and people who do not have diabetes. Low blood sugar can happen quickly, and it can be an emergency.  Make sure you know the symptoms of low blood sugar and know how to treat it.  Always keep a source of sugar (fast-acting carb) with you to treat low blood sugar. This information is not intended to replace advice given to you by your health care provider. Make sure you discuss any questions you have with your health care provider. Document Revised: 07/23/2019 Document Reviewed: 07/23/2019 Elsevier Patient Education  2021 [employer].

## 2020-11-03 NOTE — Telephone Encounter (Addendum)
Will you look into dermatology referral. He isn't sure if he has heard from their office. He requests that the appointment date and time be send to him via letter because he does not answer phone if he is unfamiliar with phone number. He would be agreeable to them leaving a voicemail as well and would call them back if necessary.

## 2020-11-03 NOTE — Telephone Encounter (Signed)
Pt was referral was sent by Jory Sims on 2/16 to Dr Margo Aye. Via fax. Pt will need to check his voicemail's; he may have missed the calls to set a appt. Will send a fax to get apppt date before I call pt back in the morning.

## 2020-11-03 NOTE — Progress Notes (Signed)
Subjective:  Patient ID: Bruce Barrera, male    DOB: 1951-02-04  Age: 70 y.o. MRN: [id number]  CC:  Chief Complaint  Patient presents with  . Follow-up  . Other    UTI/Hematuria  . Diabetes  . Hypertension      HPI  This patient arrives today for the above.  UTI/hematuria: He has completed his course of Keflex.  He is due to recheck urinalysis with reflex to culture today.  He tells me his urinary tract infection symptoms have resolved.  Hypertension: Spironolactone was recently increased to 25 mg by mouth daily by his cardiologist.  He continues on Coreg, Entresto, triamterene hydrochlorothiazide, furosemide, and potassium.  He tells me he is tolerating medications well.  Diabetes: He is a newly diagnosed diabetic.  Last A1c was 7.9 previous A1c was collected approximately 5 years ago and it was 6.3.  He is not currently on any medications for treatment of his diabetes.  He does not check his blood sugar regularly at home.  He is here to discuss type 2 diabetes and treatment options.  Past Medical History:  Diagnosis Date  . AAA (abdominal aortic aneurysm) ([place])   . Arthritis    Hips and knees  . Back pain   . Chronic systolic CHF (congestive heart failure) ([place])   . Coronary artery disease    a. 03/2016 subacute anterior MI -> 100% prox LAD >72 hours out thus treated medically, LVEF 30-35% initially  . Essential hypertension   . GERD (gastroesophageal reflux disease)   . Hemorrhoids   . History of stroke    States "stroke" with left sided weakness 5/95 in prison in [place]  . Hyperlipidemia   . Hypothyroidism   . Ischemic cardiomyopathy   . MI (myocardial infarction) ([place])    States "heart attack" 2/95 in prison in [place]  . Obesity   . Peptic ulcer    GIB 1994  . Sleep apnea   . Vitamin D deficiency disease 06/19/2019      Family History  Problem Relation Age of Onset  . Hypertension Mother   . Diabetes Mother   . Heart disease Mother   . Stroke Mother    . Stroke Father   . Heart attack Brother   . Aneurysm Brother   . Cirrhosis Brother     Social History   Social History Narrative   Lives with girlfriend for last 8 years.On disability .   Social History   Tobacco Use  . Smoking status: Current Every Day Smoker    Packs/day: 1.50    Years: 58.00    Pack years: 87.00    Types: Cigarettes    Start date: 05/18/1961  . Smokeless tobacco: Never Used  . Tobacco comment: smokes 35 cigarettes a day (06/02/2020)  Substance Use Topics  . Alcohol use: No     Current Meds  Medication Sig  . albuterol (VENTOLIN HFA) 108 (90 Base) MCG/ACT inhaler INHALE 2 PUFFS INTO THE LUNGS EVERY 6 HOURS AS NEEDED FOR WHEEZING.  Marland Kitchen aspirin 81 MG tablet Take 1 tablet (81 mg total) by mouth daily with breakfast.  . atorvastatin (LIPITOR) 80 MG tablet Take 1 tablet (80 mg total) by mouth daily.  . blood glucose meter kit and supplies KIT Dispense based on patient and insurance preference. Use to check your blood sugar daily before your first meal.  . budesonide (PULMICORT) 0.25 MG/2ML nebulizer solution Take 2 mLs (0.25 mg total) by nebulization in the morning  and at bedtime.  . calcium-vitamin D (OSCAL WITH D) 500-200 MG-UNIT tablet Take 1 tablet by mouth daily with breakfast.  . carvedilol (COREG) 12.5 MG tablet Take 1 tablet (12.5 mg total) by mouth 2 (two) times daily with a meal.  . Cholecalciferol (VITAMIN D3) 50 MCG (2000 UT) TABS Take 1 tablet by mouth daily.  . clopidogrel (PLAVIX) 75 MG tablet Take 1 tablet (75 mg total) by mouth daily.  . cyanocobalamin 100 MCG tablet Take 100 mcg by mouth daily.  Marland Kitchen ENTRESTO 49-51 MG TAKE (1) TABLET BY MOUTH TWICE DAILY.  . formoterol (PERFOROMIST) 20 MCG/2ML nebulizer solution Take 2 mLs (20 mcg total) by nebulization 2 (two) times daily.  Marland Kitchen levothyroxine (SYNTHROID) 100 MCG tablet TAKE (1) TABLET BY MOUTH ONCE DAILY.  . metFORMIN (GLUCOPHAGE) 500 MG tablet Take 0.5 tablets (250 mg total) by mouth daily with  breakfast.  . pantoprazole (PROTONIX) 40 MG tablet TAKE (1) TABLET BY MOUTH AT BEDTIME. (Patient taking differently: Take 40 mg by mouth at bedtime. TAKE (1) TABLET BY MOUTH AT BEDTIME.)  . potassium chloride SA (KLOR-CON) 10 MEQ tablet Take 1 tablet (10 mEq total) by mouth daily.  Marland Kitchen spironolactone (ALDACTONE) 25 MG tablet Take 1 tablet (25 mg total) by mouth daily.  . SYMBICORT 80-4.5 MCG/ACT inhaler Inhale into the lungs.  . triamcinolone (KENALOG) 0.025 % ointment Apply 1 application topically 2 (two) times daily.    ROS:  Review of Systems  Genitourinary: Negative for dysuria and hematuria.     Objective:   Today's Vitals: BP 138/74 (BP Location: Left Arm, Patient Position: Sitting, Cuff Size: Large)   Pulse 87   Temp 97.6 F (36.4 C) (Temporal)   Resp 18   Ht 5' 11" (1.803 m)   Wt 272 lb 9.6 oz (123.7 kg)   SpO2 97%   BMI 38.02 kg/m  Vitals with BMI 11/03/2020 10/27/2020 10/14/2020  Height 5' 11" 5' 11" 5' 11"  Weight 272 lbs 10 oz 273 lbs 274 lbs  BMI 38.04 32.67 12.45  Systolic 809 983 382  Diastolic 74 505 397  Pulse 87 92 89     Physical Exam Vitals reviewed.  Constitutional:      Appearance: Normal appearance.  HENT:     Head: Normocephalic and atraumatic.  Cardiovascular:     Rate and Rhythm: Normal rate and regular rhythm.  Pulmonary:     Effort: Pulmonary effort is normal.     Breath sounds: Normal breath sounds.  Musculoskeletal:     Cervical back: Neck supple.  Skin:    General: Skin is warm and dry.  Neurological:     Mental Status: He is alert and oriented to person, place, and time.  Psychiatric:        Mood and Affect: Mood normal.        Behavior: Behavior normal.        Thought Content: Thought content normal.        Judgment: Judgment normal.          Assessment and Plan   1. Urinary tract infection without hematuria, site unspecified   2. Essential hypertension, benign   3. Chronic diastolic heart failure ([place])   4. Type 2  diabetes mellitus with hyperglycemia, without long-term current use of insulin ([place])      Plan: 1.  We will check urine to make sure urinary tract infection has cleared. 2.,  3.  He will continue to follow-up with cardiology as scheduled.  Blood pressure  is better controlled on current regimen so we will recheck CMP in light of the fact that he increased his spironolactone recently. 4.  We did discuss diabetes in great length today.  I have prescribed glucometer and kits that he can start checking a fasting blood sugar daily.  We did discuss signs and symptoms of hypoglycemia and what to do if this were to occur.  We also discussed starting on medication for the treatment of his type 2 diabetes and per shared decision making we will start him on very low-dose of Metformin (250 mg by mouth daily) to make sure he tolerates it.  We will titrate up as he tolerates.  May also consider adding Jardiance in the future.  May need to consider referral to dietitian as well, however he tells me overall he avoids sugary foods and will sometimes drink about half a cup of Sprite once a week but otherwise avoids sugary beverages.   Tests ordered Orders Placed This Encounter  Procedures  . Urinalysis with Culture Reflex  . CMP with eGFR([facility])      Meds ordered this encounter  Medications  . metFORMIN (GLUCOPHAGE) 500 MG tablet    Sig: Take 0.5 tablets (250 mg total) by mouth daily with breakfast.    Dispense:  45 tablet    Refill:  0    Order Specific Question:   Supervising Provider    Answer:   Hurshel Party C [[id number]]  . blood glucose meter kit and supplies KIT    Sig: Dispense based on patient and insurance preference. Use to check your blood sugar daily before your first meal.    Dispense:  1 each    Refill:  0    Order Specific Question:   Supervising Provider    Answer:   Doree Albee [[id number]]    Order Specific Question:   Number of strips    Answer:   100    Order Specific Question:    Number of lancets    Answer:   100    Patient to follow-up in 2 weeks to see how he is tolerating the Metformin and he will bring in blood glucose logs at that time.  Ailene Ards, NP

## 2020-11-05 ENCOUNTER — Other Ambulatory Visit: Payer: Self-pay

## 2020-11-05 DIAGNOSIS — I713 Abdominal aortic aneurysm, ruptured, unspecified: Secondary | ICD-10-CM

## 2020-11-05 DIAGNOSIS — J449 Chronic obstructive pulmonary disease, unspecified: Secondary | ICD-10-CM | POA: Diagnosis not present

## 2020-11-06 ENCOUNTER — Other Ambulatory Visit (INDEPENDENT_AMBULATORY_CARE_PROVIDER_SITE_OTHER): Payer: Self-pay | Admitting: Nurse Practitioner

## 2020-11-06 DIAGNOSIS — N390 Urinary tract infection, site not specified: Secondary | ICD-10-CM

## 2020-11-06 LAB — URINALYSIS W MICROSCOPIC + REFLEX CULTURE
Bilirubin Urine: NEGATIVE
Hyaline Cast: NONE SEEN /LPF
Ketones, ur: NEGATIVE
Nitrites, Initial: POSITIVE — AB
Specific Gravity, Urine: 1.027 (ref 1.001–1.03)
Squamous Epithelial / HPF: NONE SEEN /HPF (ref <=5)
WBC, UA: >=60 /HPF — AB (ref 0–5)
pH: 6 (ref 5.0–8.0)

## 2020-11-06 LAB — COMPLETE METABOLIC PANEL WITH GFR
AG Ratio: 1.4 (calc) (ref 1.0–2.5)
ALT: 16 U/L (ref 9–46)
AST: 13 U/L (ref 10–35)
Albumin: 3.8 g/dL (ref 3.6–5.1)
Alkaline phosphatase (APISO): 120 U/L (ref 35–144)
BUN: 17 mg/dL (ref 7–25)
CO2: 33 mmol/L — ABNORMAL HIGH (ref 20–32)
Calcium: 9 mg/dL (ref 8.6–10.3)
Chloride: 99 mmol/L (ref 98–110)
Creat: 1.07 mg/dL (ref 0.70–1.25)
GFR, Est African American: 82 mL/min/{1.73_m2} (ref >=60)
GFR, Est Non African American: 71 mL/min/{1.73_m2} (ref >=60)
Globulin: 2.8 g/dL (calc) (ref 1.9–3.7)
Glucose, Bld: 174 mg/dL — ABNORMAL HIGH (ref 65–139)
Potassium: 4.1 mmol/L (ref 3.5–5.3)
Sodium: 137 mmol/L (ref 135–146)
Total Bilirubin: 0.5 mg/dL (ref 0.2–1.2)
Total Protein: 6.6 g/dL (ref 6.1–8.1)

## 2020-11-06 LAB — CULTURE INDICATED

## 2020-11-06 LAB — URINE CULTURE

## 2020-11-06 MED ORDER — CIPROFLOXACIN HCL 250 MG PO TABS: 250.0000 mg | ORAL_TABLET | Freq: Two times a day (BID) | ORAL | 0 refills | Status: DC

## 2020-11-06 NOTE — Progress Notes (Signed)
Please call this patient and let him know that his urine results did show he probably still has a little bit of a urinary tract infection.  I did send in ciprofloxacin 250 mg tabs and he should take 1 tablet by mouth twice a day for 5 days.  Please warn him of serious side effects including tendon rupture and C. difficile infection.  If he experiences any joint pain or significant diarrhea he should stop the medication and let us know.  If he has any questions please let me know.  Just as an FYI I did try to call this patient over the weekend but he did not answer so I did leave him a voicemail. Just want to make sure that he is aware.  Thank you.

## 2020-11-08 NOTE — Progress Notes (Signed)
Called patient and spoke to a [facility] and she stated that the patient is asleep and I requested for patient to call us back when he can to discuss lab results.

## 2020-11-10 ENCOUNTER — Ambulatory Visit: Payer: Medicare Other

## 2020-11-11 ENCOUNTER — Encounter: Payer: Self-pay | Admitting: Cardiology

## 2020-11-11 ENCOUNTER — Other Ambulatory Visit: Payer: Self-pay

## 2020-11-11 ENCOUNTER — Telehealth: Payer: Self-pay

## 2020-11-11 MED ORDER — CARVEDILOL 25 MG PO TABS: 25.0000 mg | ORAL_TABLET | Freq: Two times a day (BID) | ORAL | 3 refills | Status: DC

## 2020-11-11 NOTE — Telephone Encounter (Signed)
Contacted patient and he verbalized understanding.

## 2020-11-11 NOTE — Telephone Encounter (Signed)
-----   Message from Jonelle Sidle, MD sent at 11/11/2020  9:48 AM EST ----- Results reviewed.  Outpatient blood pressure and heart rate measurements reviewed.  Increase Coreg to 25 mg twice daily.

## 2020-11-11 NOTE — Telephone Encounter (Signed)
-----   Message from Samuel G McDowell, MD sent at 11/11/2020  9:48 AM EST ----- Results reviewed.  Outpatient blood pressure and heart rate measurements reviewed.  Increase Coreg to 25 mg twice daily. 

## 2020-11-16 DIAGNOSIS — J449 Chronic obstructive pulmonary disease, unspecified: Secondary | ICD-10-CM | POA: Diagnosis not present

## 2020-11-17 ENCOUNTER — Ambulatory Visit (INDEPENDENT_AMBULATORY_CARE_PROVIDER_SITE_OTHER): Payer: Medicare Other | Admitting: Nurse Practitioner

## 2020-11-22 ENCOUNTER — Other Ambulatory Visit: Payer: Medicare Other

## 2020-12-03 ENCOUNTER — Other Ambulatory Visit: Payer: Self-pay

## 2020-12-03 ENCOUNTER — Encounter: Payer: Self-pay | Admitting: Vascular Surgery

## 2020-12-03 ENCOUNTER — Ambulatory Visit (INDEPENDENT_AMBULATORY_CARE_PROVIDER_SITE_OTHER): Payer: Medicare Other | Admitting: Vascular Surgery

## 2020-12-03 ENCOUNTER — Ambulatory Visit
Admission: RE | Admit: 2020-12-03 | Discharge: 2020-12-03 | Disposition: A | Discharge status: Home / Self Care | Payer: Medicare Other | Source: Ambulatory Visit | Attending: Vascular Surgery | Admitting: Vascular Surgery

## 2020-12-03 VITALS — BP 149/93 | HR 70 | Temp 97.9°F | Resp 20 | Ht 71.0 in | Wt 272.0 lb

## 2020-12-03 DIAGNOSIS — J449 Chronic obstructive pulmonary disease, unspecified: Secondary | ICD-10-CM | POA: Diagnosis not present

## 2020-12-03 DIAGNOSIS — K5730 Diverticulosis of large intestine without perforation or abscess without bleeding: Secondary | ICD-10-CM | POA: Diagnosis not present

## 2020-12-03 DIAGNOSIS — I713 Abdominal aortic aneurysm, ruptured, unspecified: Secondary | ICD-10-CM

## 2020-12-03 DIAGNOSIS — K8020 Calculus of gallbladder without cholecystitis without obstruction: Secondary | ICD-10-CM | POA: Diagnosis not present

## 2020-12-03 DIAGNOSIS — I714 Abdominal aortic aneurysm, without rupture: Secondary | ICD-10-CM | POA: Diagnosis not present

## 2020-12-03 DIAGNOSIS — I723 Aneurysm of iliac artery: Secondary | ICD-10-CM | POA: Diagnosis not present

## 2020-12-03 MED ORDER — IOPAMIDOL (ISOVUE-370) INJECTION 76%
75.0000 mL | Freq: Once | INTRAVENOUS | Status: AC | PRN
  Administered 2020-12-03: 75 mL via INTRAVENOUS

## 2020-12-03 NOTE — Progress Notes (Signed)
Patient ID: Bruce Barrera, male   DOB: 16-Jun-1951, 70 y.o.   MRN: [id number]  Reason for Consult: Follow-up   Referred by Doree Albee, MD  Subjective:     HPI:  Bruce Barrera is a 70 y.o. male previous history of ruptured abdominal aortic aneurysm that was repaired endovascular.  He has done very well since.  He walks with a cane.  He has no new back or abdominal pain.  He has no complaints related to today's visit.  He remains on aspirin and statin.  Past Medical History:  Diagnosis Date  . AAA (abdominal aortic aneurysm) ([place])   . Arthritis    Hips and knees  . Back pain   . Chronic systolic CHF (congestive heart failure) ([place])   . Coronary artery disease    a. 03/2016 subacute anterior MI -> 100% prox LAD >72 hours out thus treated medically, LVEF 30-35% initially  . Essential hypertension   . GERD (gastroesophageal reflux disease)   . Hemorrhoids   . History of stroke    States "stroke" with left sided weakness 5/95 in prison in [place]  . Hyperlipidemia   . Hypothyroidism   . Ischemic cardiomyopathy   . MI (myocardial infarction) ([place])    States "heart attack" 2/95 in prison in [place]  . Obesity   . Peptic ulcer    GIB 1994  . Sleep apnea   . Vitamin D deficiency disease 06/19/2019   Family History  Problem Relation Age of Onset  . Hypertension Mother   . Diabetes Mother   . Heart disease Mother   . Stroke Mother   . Stroke Father   . Heart attack Brother   . Aneurysm Brother   . Cirrhosis Brother    Past Surgical History:  Procedure Laterality Date  . ABDOMINAL AORTIC ENDOVASCULAR STENT GRAFT N/A 09/09/2019   Procedure: ABDOMINAL AORTIC ENDOVASCULAR STENT GRAFT;  Surgeon: Waynetta Sandy, MD;  Location: [facility];  Service: Vascular;  Laterality: N/A;  . ADJUSTABLE SUTURE MANIPULATION Left 04/10/2018   Procedure: ADJUSTABLE SUTURE MANIPULATION;  Surgeon: Gevena Cotton, MD;  Location: [facility];  Service: Ophthalmology;  Laterality: Left;  .  CARDIAC CATHETERIZATION N/A 03/15/2016   Procedure: Right/Left Heart Cath and Coronary Angiography;  Surgeon: Wellington Hampshire, MD;  Location: [facility];  Service: Cardiovascular;  Laterality: N/A;  . COLONOSCOPY  03/23/2011   hemorrhoids  . KNEE ARTHROSCOPY     Right knee x 2  . MEDIAN RECTUS REPAIR Left 04/10/2018   Procedure: LEFT MEDIAN RECTUS REPAIR;  Surgeon: Gevena Cotton, MD;  Location: [facility];  Service: Ophthalmology;  Laterality: Left;  Marland Kitchen MUSCLE RECESSION AND RESECTION Left 04/10/2018   Procedure: LEFT MUSCLE RECESSION;  Surgeon: Gevena Cotton, MD;  Location: [place] [facility];  Service: Ophthalmology;  Laterality: Left;  . TOTAL HIP ARTHROPLASTY Right 2014    Short Social History:  Social History   Tobacco Use  . Smoking status: Current Every Day Smoker    Packs/day: 1.50    Years: 58.00    Pack years: 87.00    Types: Cigarettes    Start date: 05/18/1961  . Smokeless tobacco: Never Used  . Tobacco comment: smokes 35 cigarettes a day (06/02/2020)  Substance Use Topics  . Alcohol use: No    No Known Allergies  Current Outpatient Medications  Medication Sig Dispense Refill  . albuterol (VENTOLIN HFA) 108 (90 Base) MCG/ACT inhaler INHALE 2 PUFFS INTO THE LUNGS EVERY 6 HOURS AS NEEDED  FOR WHEEZING. 8.5 g 6  . aspirin 81 MG tablet Take 1 tablet (81 mg total) by mouth daily with breakfast. 30 tablet 2  . atorvastatin (LIPITOR) 80 MG tablet Take 1 tablet (80 mg total) by mouth daily. 90 tablet 3  . blood glucose meter kit and supplies KIT Dispense based on patient and insurance preference. Use to check your blood sugar daily before your first meal. 1 each 0  . budesonide (PULMICORT) 0.25 MG/2ML nebulizer solution Take 2 mLs (0.25 mg total) by nebulization in the morning and at bedtime. 60 mL 3  . calcium-vitamin D (OSCAL WITH D) 500-200 MG-UNIT tablet Take 1 tablet by mouth daily with breakfast.    . carvedilol (COREG) 25 MG tablet Take 1 tablet (25 mg total) by mouth 2 (two)  times daily. 180 tablet 3  . Cholecalciferol (VITAMIN D3) 50 MCG (2000 UT) TABS Take 1 tablet by mouth daily.    . clopidogrel (PLAVIX) 75 MG tablet Take 1 tablet (75 mg total) by mouth daily. 90 tablet 3  . cyanocobalamin 100 MCG tablet Take 100 mcg by mouth daily.    Marland Kitchen ENTRESTO 49-51 MG TAKE (1) TABLET BY MOUTH TWICE DAILY. 60 tablet 0  . formoterol (PERFOROMIST) 20 MCG/2ML nebulizer solution Take 2 mLs (20 mcg total) by nebulization 2 (two) times daily. 120 mL 3  . levothyroxine (SYNTHROID) 100 MCG tablet TAKE (1) TABLET BY MOUTH ONCE DAILY. 90 tablet 0  . metFORMIN (GLUCOPHAGE) 500 MG tablet Take 0.5 tablets (250 mg total) by mouth daily with breakfast. 45 tablet 0  . pantoprazole (PROTONIX) 40 MG tablet TAKE (1) TABLET BY MOUTH AT BEDTIME. (Patient taking differently: Take 40 mg by mouth at bedtime. TAKE (1) TABLET BY MOUTH AT BEDTIME.) 30 tablet 0  . potassium chloride SA (KLOR-CON) 10 MEQ tablet Take 1 tablet (10 mEq total) by mouth daily. 90 tablet 1  . spironolactone (ALDACTONE) 25 MG tablet Take 1 tablet (25 mg total) by mouth daily. 90 tablet 3  . SYMBICORT 80-4.5 MCG/ACT inhaler Inhale into the lungs.    . triamcinolone (KENALOG) 0.025 % ointment Apply 1 application topically 2 (two) times daily. 30 g 0  . furosemide (LASIX) 40 MG tablet Take 1.5 tablets (60 mg total) by mouth daily. 135 tablet 3   No current facility-administered medications for this visit.    Review of Systems  Constitutional:  Constitutional negative. HENT: HENT negative.  Eyes: Eyes negative.  Respiratory: Respiratory negative.  Cardiovascular: Cardiovascular negative.  GI: Gastrointestinal negative.  Musculoskeletal: Musculoskeletal negative.  Skin: Skin negative.  Neurological: Neurological negative. Hematologic: Hematologic/lymphatic negative.  Psychiatric: Psychiatric negative.        Objective:  Objective  Vitals:   12/03/20 1344  BP: (!) 149/93  Pulse: 70  Resp: 20  Temp: 97.9 F (36.6  C)  SpO2: 94%    Physical Exam Constitutional:      Appearance: He is obese.  HENT:     Head: Normocephalic.     Nose:     Comments: Wearing a mask Eyes:     Extraocular Movements: Extraocular movements intact.     Pupils: Pupils are equal, round, and reactive to light.  Cardiovascular:     Pulses:          Popliteal pulses are 3+ on the right side and 2+ on the left side.     Heart sounds: Normal heart sounds.  Pulmonary:     Effort: Pulmonary effort is normal.     Breath  sounds: Normal breath sounds.  Abdominal:     General: Abdomen is flat.     Palpations: Abdomen is soft. There is no mass.  Musculoskeletal:        General: Normal range of motion.     Cervical back: Normal range of motion and neck supple.     Right lower leg: Edema present.     Left lower leg: Edema present.  Skin:    General: Skin is warm and dry.  Neurological:     General: No focal deficit present.     Mental Status: He is alert.  Psychiatric:        Mood and Affect: Mood normal.        Behavior: Behavior normal.        Thought Content: Thought content normal.        Judgment: Judgment normal.     Data: CT IMPRESSION: 1. Patent infrarenal bifurcated stent graft without endoleak, stable 6.6 cm native sac diameter. Aortic aneurysm NOS (ICD10-I71.9). 2. Stable left internal iliac artery coil embolization and stent graft exclusion of the left common iliac aneurysm. 3. Stable high-grade stenosis or short segment occlusion at the ostium of the dominant inferior right renal artery with decreased enhancement and atrophy of most of the right kidney. 4. Cholelithiasis. 5. Descending and sigmoid diverticulosis. 6. Paraumbilical hernia containing only mesenteric fat.     Assessment/Plan:    70 year old male history of ruptured abdominal aortic aneurysm now well-healed after endovascular aneurysm repair.  Aneurysm sac is stable. Popliteal pulse on the right readily palpable today. Will f/u you 1  year with popliteal artery duplexes as well as abdominal aortic duplex status post EVAR.      Waynetta Sandy MD [facility]

## 2020-12-08 ENCOUNTER — Ambulatory Visit (INDEPENDENT_AMBULATORY_CARE_PROVIDER_SITE_OTHER): Payer: Medicare Other | Admitting: Nurse Practitioner

## 2020-12-08 ENCOUNTER — Encounter (INDEPENDENT_AMBULATORY_CARE_PROVIDER_SITE_OTHER): Payer: Self-pay | Admitting: Nurse Practitioner

## 2020-12-08 ENCOUNTER — Other Ambulatory Visit: Payer: Self-pay

## 2020-12-08 VITALS — BP 104/70 | HR 69 | Temp 97.3°F | Ht 71.0 in | Wt 271.4 lb

## 2020-12-08 DIAGNOSIS — E1165 Type 2 diabetes mellitus with hyperglycemia: Secondary | ICD-10-CM | POA: Diagnosis not present

## 2020-12-08 DIAGNOSIS — N390 Urinary tract infection, site not specified: Secondary | ICD-10-CM | POA: Diagnosis not present

## 2020-12-08 MED ORDER — METFORMIN HCL 500 MG PO TABS: 500.0000 mg | ORAL_TABLET | Freq: Every day | ORAL | 1 refills | Status: DC

## 2020-12-08 NOTE — Progress Notes (Signed)
Subjective:  Patient ID: Bruce Barrera, male    DOB: 1951/07/26  Age: 70 y.o. MRN: [id number]  CC:  Chief Complaint  Patient presents with  . Follow-up    Doing okay, blood sugar numbers has come down in the 100's, needs Metformin refilled      HPI  This patient arrives today for the above.  Hematuria/UTI: At last office visit we rechecked his urine did show signs of urinary tract affection as well as hematuria.  He tells me that he recently saw his vascular surgeon and he was told that he may still have some residual bleeding into one of his kidneys from his ruptured abdominal aortic aneurysm repair a few years ago.  He tells me this may explain the blood found on urinalysis.  He tells me is not having any urinary symptoms such as frequency, urgency, hematuria, or dysuria.  Type 2 diabetes: He was recently diagnosed with type 2 diabetes.  Last A1c was collected about 6 weeks ago and was 7.9.  We started him on Metformin he is currently on 500 mg daily.  He is also on statin therapy.  He tells me he is tolerating the Metformin well and is noticed that his blood sugars have vastly improved since starting the Metformin.  He denies any hypoglycemic episodes.  Past Medical History:  Diagnosis Date  . AAA (abdominal aortic aneurysm) ([place])   . Arthritis    Hips and knees  . Back pain   . Chronic systolic CHF (congestive heart failure) ([place])   . Coronary artery disease    a. 03/2016 subacute anterior MI -> 100% prox LAD >72 hours out thus treated medically, LVEF 30-35% initially  . Essential hypertension   . GERD (gastroesophageal reflux disease)   . Hemorrhoids   . History of stroke    States "stroke" with left sided weakness 5/95 in prison in [place]  . Hyperlipidemia   . Hypothyroidism   . Ischemic cardiomyopathy   . MI (myocardial infarction) ([place])    States "heart attack" 2/95 in prison in [place]  . Obesity   . Peptic ulcer    GIB 1994  . Sleep apnea   . Vitamin D deficiency  disease 06/19/2019      Family History  Problem Relation Age of Onset  . Hypertension Mother   . Diabetes Mother   . Heart disease Mother   . Stroke Mother   . Stroke Father   . Heart attack Brother   . Aneurysm Brother   . Cirrhosis Brother     Social History   Social History Narrative   Lives with girlfriend for last 8 years.On disability .   Social History   Tobacco Use  . Smoking status: Current Every Day Smoker    Packs/day: 1.50    Years: 58.00    Pack years: 87.00    Types: Cigarettes    Start date: 05/18/1961  . Smokeless tobacco: Never Used  . Tobacco comment: smokes 35 cigarettes a day (06/02/2020)  Substance Use Topics  . Alcohol use: No     Current Meds  Medication Sig  . albuterol (VENTOLIN HFA) 108 (90 Base) MCG/ACT inhaler INHALE 2 PUFFS INTO THE LUNGS EVERY 6 HOURS AS NEEDED FOR WHEEZING.  Marland Kitchen aspirin 81 MG tablet Take 1 tablet (81 mg total) by mouth daily with breakfast.  . atorvastatin (LIPITOR) 80 MG tablet Take 1 tablet (80 mg total) by mouth daily.  . blood glucose meter kit and  supplies KIT Dispense based on patient and insurance preference. Use to check your blood sugar daily before your first meal.  . budesonide (PULMICORT) 0.25 MG/2ML nebulizer solution Take 2 mLs (0.25 mg total) by nebulization in the morning and at bedtime.  . calcium-vitamin D (OSCAL WITH D) 500-200 MG-UNIT tablet Take 1 tablet by mouth daily with breakfast.  . carvedilol (COREG) 25 MG tablet Take 1 tablet (25 mg total) by mouth 2 (two) times daily.  . Cholecalciferol (VITAMIN D3) 50 MCG (2000 UT) TABS Take 1 tablet by mouth daily.  . clopidogrel (PLAVIX) 75 MG tablet Take 1 tablet (75 mg total) by mouth daily.  . cyanocobalamin 100 MCG tablet Take 100 mcg by mouth daily.  Marland Kitchen ENTRESTO 49-51 MG TAKE (1) TABLET BY MOUTH TWICE DAILY.  . formoterol (PERFOROMIST) 20 MCG/2ML nebulizer solution Take 2 mLs (20 mcg total) by nebulization 2 (two) times daily.  . Lancets (ONETOUCH DELICA  PLUS [id number]) MISC Apply topically daily.  Marland Kitchen levothyroxine (SYNTHROID) 100 MCG tablet TAKE (1) TABLET BY MOUTH ONCE DAILY.  Marland Kitchen ONETOUCH ULTRA test strip daily.  . pantoprazole (PROTONIX) 40 MG tablet TAKE (1) TABLET BY MOUTH AT BEDTIME. (Patient taking differently: Take 40 mg by mouth at bedtime. TAKE (1) TABLET BY MOUTH AT BEDTIME.)  . potassium chloride SA (KLOR-CON) 10 MEQ tablet Take 1 tablet (10 mEq total) by mouth daily.  Marland Kitchen spironolactone (ALDACTONE) 25 MG tablet Take 1 tablet (25 mg total) by mouth daily.  . SYMBICORT 80-4.5 MCG/ACT inhaler Inhale into the lungs.  . triamcinolone (KENALOG) 0.025 % ointment Apply 1 application topically 2 (two) times daily.  . [DISCONTINUED] metFORMIN (GLUCOPHAGE) 500 MG tablet Take 0.5 tablets (250 mg total) by mouth daily with breakfast. (Patient taking differently: Take 500 mg by mouth daily with breakfast.)    ROS:  Review of Systems  Constitutional: Negative for fever and malaise/fatigue.  Respiratory: Negative for shortness of breath.   Cardiovascular: Negative for chest pain.  Gastrointestinal: Negative for diarrhea, nausea and vomiting.  Genitourinary: Positive for hematuria. Negative for dysuria and urgency.     Objective:   Today's Vitals: BP 104/70   Pulse 69   Temp (!) 97.3 F (36.3 C) (Temporal)   Ht '5\' 11"'  (1.803 m)   Wt 271 lb 6.4 oz (123.1 kg)   SpO2 93%   BMI 37.85 kg/m  Vitals with BMI 12/08/2020 12/03/2020 11/03/2020  Height '5\' 11"'  '5\' 11"'  '5\' 11"'   Weight 271 lbs 6 oz 272 lbs 272 lbs 10 oz  BMI 37.87 18.29 93.71  Systolic 696 789 381  Diastolic 70 93 74  Pulse 69 70 87     Physical Exam Vitals reviewed.  Constitutional:      Appearance: Normal appearance.  HENT:     Head: Normocephalic and atraumatic.  Cardiovascular:     Rate and Rhythm: Normal rate and regular rhythm.  Pulmonary:     Effort: Pulmonary effort is normal.     Breath sounds: Normal breath sounds.  Musculoskeletal:     Cervical back: Neck supple.   Skin:    General: Skin is warm and dry.  Neurological:     Mental Status: He is alert and oriented to person, place, and time.  Psychiatric:        Mood and Affect: Mood normal.        Behavior: Behavior normal.        Thought Content: Thought content normal.        Judgment: Judgment normal.  Assessment and Plan   1. Urinary tract infection without hematuria, site unspecified   2. Type 2 diabetes mellitus with hyperglycemia, without long-term current use of insulin ([place])      Plan: 1.  We will recheck urinalysis and I will reach out to his vascular surgeon to confirm that there may be evidence of bleeding into his kidney.  Further recommendations may be made based upon results and conversation with the vascular surgeon. 2.  He will continue on his Metformin and will follow up with [place] in about 3 months which point he will be due for A1c to be rechecked.   Tests ordered Orders Placed This Encounter  Procedures  . CMP with eGFR([facility])  . Urinalysis with Culture Reflex      Meds ordered this encounter  Medications  . metFORMIN (GLUCOPHAGE) 500 MG tablet    Sig: Take 1 tablet (500 mg total) by mouth daily with breakfast.    Dispense:  90 tablet    Refill:  1    Order Specific Question:   Supervising Provider    Answer:   Doree Albee [[id number]]    Patient to follow-up in 3-6 months or sooner as needed.  Ailene Ards, NP

## 2020-12-08 NOTE — Patient Instructions (Signed)
(  [phone]) [phone] - [facility] [place]

## 2020-12-10 LAB — COMPLETE METABOLIC PANEL WITH GFR
AG Ratio: 1.5 (calc) (ref 1.0–2.5)
ALT: 13 U/L (ref 9–46)
AST: 12 U/L (ref 10–35)
Albumin: 4 g/dL (ref 3.6–5.1)
Alkaline phosphatase (APISO): 118 U/L (ref 35–144)
BUN: 18 mg/dL (ref 7–25)
CO2: 34 mmol/L — ABNORMAL HIGH (ref 20–32)
Calcium: 9.5 mg/dL (ref 8.6–10.3)
Chloride: 100 mmol/L (ref 98–110)
Creat: 1.21 mg/dL (ref 0.70–1.25)
GFR, Est African American: 71 mL/min/{1.73_m2} (ref >=60)
GFR, Est Non African American: 61 mL/min/{1.73_m2} (ref >=60)
Globulin: 2.6 g/dL (calc) (ref 1.9–3.7)
Glucose, Bld: 244 mg/dL — ABNORMAL HIGH (ref 65–139)
Potassium: 4.8 mmol/L (ref 3.5–5.3)
Sodium: 140 mmol/L (ref 135–146)
Total Bilirubin: 0.6 mg/dL (ref 0.2–1.2)
Total Protein: 6.6 g/dL (ref 6.1–8.1)

## 2020-12-10 LAB — URINALYSIS W MICROSCOPIC + REFLEX CULTURE
Bacteria, UA: NONE SEEN /HPF
Bilirubin Urine: NEGATIVE
Hgb urine dipstick: NEGATIVE
Nitrites, Initial: NEGATIVE
RBC / HPF: NONE SEEN /HPF (ref 0–2)
Specific Gravity, Urine: 1.021 (ref 1.001–1.03)
WBC, UA: NONE SEEN /HPF (ref 0–5)
pH: 5.5 (ref 5.0–8.0)

## 2020-12-10 LAB — URINE CULTURE: Result:: NO GROWTH

## 2020-12-10 LAB — CULTURE INDICATED

## 2020-12-17 DIAGNOSIS — J449 Chronic obstructive pulmonary disease, unspecified: Secondary | ICD-10-CM | POA: Diagnosis not present

## 2020-12-28 DIAGNOSIS — L84 Corns and callosities: Secondary | ICD-10-CM | POA: Diagnosis not present

## 2020-12-28 DIAGNOSIS — M79676 Pain in unspecified toe(s): Secondary | ICD-10-CM | POA: Diagnosis not present

## 2020-12-28 DIAGNOSIS — I70203 Unspecified atherosclerosis of native arteries of extremities, bilateral legs: Secondary | ICD-10-CM | POA: Diagnosis not present

## 2020-12-28 DIAGNOSIS — B351 Tinea unguium: Secondary | ICD-10-CM | POA: Diagnosis not present

## 2021-01-03 ENCOUNTER — Other Ambulatory Visit (INDEPENDENT_AMBULATORY_CARE_PROVIDER_SITE_OTHER): Payer: Self-pay | Admitting: Nurse Practitioner

## 2021-01-03 DIAGNOSIS — J449 Chronic obstructive pulmonary disease, unspecified: Secondary | ICD-10-CM | POA: Diagnosis not present

## 2021-01-06 ENCOUNTER — Telehealth (INDEPENDENT_AMBULATORY_CARE_PROVIDER_SITE_OTHER): Payer: Self-pay | Admitting: Internal Medicine

## 2021-01-06 DIAGNOSIS — L820 Inflamed seborrheic keratosis: Secondary | ICD-10-CM | POA: Diagnosis not present

## 2021-01-06 DIAGNOSIS — L821 Other seborrheic keratosis: Secondary | ICD-10-CM | POA: Diagnosis not present

## 2021-01-06 NOTE — Telephone Encounter (Signed)
Left message for patient to call back and schedule Medicare Annual Wellness Visit (AWV) either virtually or in office.   awvi 12/10/17 per [facility]   please schedule at anytime with [facility]  health coach  This should be a 40 minute visit.

## 2021-01-16 DIAGNOSIS — J449 Chronic obstructive pulmonary disease, unspecified: Secondary | ICD-10-CM | POA: Diagnosis not present

## 2021-01-24 ENCOUNTER — Ambulatory Visit (INDEPENDENT_AMBULATORY_CARE_PROVIDER_SITE_OTHER): Payer: Medicare Other | Admitting: Internal Medicine

## 2021-01-24 ENCOUNTER — Other Ambulatory Visit: Payer: Self-pay | Admitting: Student

## 2021-01-24 ENCOUNTER — Other Ambulatory Visit: Payer: Self-pay

## 2021-01-24 ENCOUNTER — Encounter (INDEPENDENT_AMBULATORY_CARE_PROVIDER_SITE_OTHER): Payer: Self-pay | Admitting: Internal Medicine

## 2021-01-24 VITALS — BP 136/80 | Resp 18 | Ht 71.0 in | Wt 269.3 lb

## 2021-01-24 DIAGNOSIS — G8929 Other chronic pain: Secondary | ICD-10-CM | POA: Diagnosis not present

## 2021-01-24 DIAGNOSIS — M549 Dorsalgia, unspecified: Secondary | ICD-10-CM | POA: Diagnosis not present

## 2021-01-24 DIAGNOSIS — M25551 Pain in right hip: Secondary | ICD-10-CM | POA: Diagnosis not present

## 2021-01-24 NOTE — Progress Notes (Signed)
Metrics: Intervention Frequency ACO  Documented Smoking Status Yearly  Screened one or more times in 24 months  Cessation Counseling or  Active cessation medication Past 24 months  Past 24 months   Guideline developer: UpToDate (See UpToDate for funding source) Date Released: 2014       Wellness Office Visit  Subjective:  Patient ID: Bruce Barrera, male    DOB: August 26, 1951  Age: 70 y.o. MRN: [id number]  CC: Right hip and back pain. HPI  The patient describes the above symptoms which are mostly chronic in nature and this is all stemming from a right hip surgery he had done in [facility] by orthopedic surgeon, Dr. Case several years ago. He now feels that the pain can be unbearable at times and it seems to radiate to the back and upwards in the midline. He is able to walk and he mostly manages the pain without really taking any medications of any significance. Past Medical History:  Diagnosis Date  . AAA (abdominal aortic aneurysm) ([place])   . Arthritis    Hips and knees  . Back pain   . Chronic systolic CHF (congestive heart failure) ([place])   . Coronary artery disease    a. 03/2016 subacute anterior MI -> 100% prox LAD >72 hours out thus treated medically, LVEF 30-35% initially  . Essential hypertension   . GERD (gastroesophageal reflux disease)   . Hemorrhoids   . History of stroke    States "stroke" with left sided weakness 5/95 in prison in [place]  . Hyperlipidemia   . Hypothyroidism   . Ischemic cardiomyopathy   . MI (myocardial infarction) ([place])    States "heart attack" 2/95 in prison in [place]  . Obesity   . Peptic ulcer    GIB 1994  . Sleep apnea   . Vitamin D deficiency disease 06/19/2019   Past Surgical History:  Procedure Laterality Date  . ABDOMINAL AORTIC ENDOVASCULAR STENT GRAFT N/A 09/09/2019   Procedure: ABDOMINAL AORTIC ENDOVASCULAR STENT GRAFT;  Surgeon: Waynetta Sandy, MD;  Location: [facility];  Service: Vascular;  Laterality: N/A;  . ADJUSTABLE SUTURE  MANIPULATION Left 04/10/2018   Procedure: ADJUSTABLE SUTURE MANIPULATION;  Surgeon: Gevena Cotton, MD;  Location: [facility];  Service: Ophthalmology;  Laterality: Left;  . CARDIAC CATHETERIZATION N/A 03/15/2016   Procedure: Right/Left Heart Cath and Coronary Angiography;  Surgeon: Wellington Hampshire, MD;  Location: [facility];  Service: Cardiovascular;  Laterality: N/A;  . COLONOSCOPY  03/23/2011   hemorrhoids  . KNEE ARTHROSCOPY     Right knee x 2  . MEDIAN RECTUS REPAIR Left 04/10/2018   Procedure: LEFT MEDIAN RECTUS REPAIR;  Surgeon: Gevena Cotton, MD;  Location: [place];  Service: Ophthalmology;  Laterality: Left;  Marland Kitchen MUSCLE RECESSION AND RESECTION Left 04/10/2018   Procedure: LEFT MUSCLE RECESSION;  Surgeon: Gevena Cotton, MD;  Location: [facility];  Service: Ophthalmology;  Laterality: Left;  . TOTAL HIP ARTHROPLASTY Right 2014     Family History  Problem Relation Age of Onset  . Hypertension Mother   . Diabetes Mother   . Heart disease Mother   . Stroke Mother   . Stroke Father   . Heart attack Brother   . Aneurysm Brother   . Cirrhosis Brother     Social History   Social History Narrative   Lives with girlfriend for last 8 years.On disability .   Social History   Tobacco Use  . Smoking status: Current Every Day Smoker    Packs/day:  1.50    Years: 58.00    Pack years: 87.00    Types: Cigarettes    Start date: 05/18/1961  . Smokeless tobacco: Never Used  . Tobacco comment: smokes 35 cigarettes a day (06/02/2020)  Substance Use Topics  . Alcohol use: No    Current Meds  Medication Sig  . albuterol (VENTOLIN HFA) 108 (90 Base) MCG/ACT inhaler INHALE 2 PUFFS INTO THE LUNGS EVERY 6 HOURS AS NEEDED FOR WHEEZING.  Marland Kitchen aspirin 81 MG tablet Take 1 tablet (81 mg total) by mouth daily with breakfast.  . atorvastatin (LIPITOR) 80 MG tablet Take 1 tablet (80 mg total) by mouth daily.  . blood glucose meter kit and supplies KIT Dispense based on patient and insurance preference.  Use to check your blood sugar daily before your first meal.  . budesonide (PULMICORT) 0.25 MG/2ML nebulizer solution USE 1 VIAL  IN  NEBULIZER TWICE  DAILY - Rinse mouth after each use  . calcium-vitamin D (OSCAL WITH D) 500-200 MG-UNIT tablet Take 1 tablet by mouth daily with breakfast.  . carvedilol (COREG) 25 MG tablet Take 1 tablet (25 mg total) by mouth 2 (two) times daily.  . Cholecalciferol (VITAMIN D3) 50 MCG (2000 UT) TABS Take 1 tablet by mouth daily.  . clopidogrel (PLAVIX) 75 MG tablet Take 1 tablet (75 mg total) by mouth daily.  . cyanocobalamin 100 MCG tablet Take 100 mcg by mouth daily.  Marland Kitchen ENTRESTO 49-51 MG TAKE (1) TABLET BY MOUTH TWICE DAILY.  Marland Kitchen Lancets (ONETOUCH DELICA PLUS [id number]) MISC Apply topically daily.  Marland Kitchen levothyroxine (SYNTHROID) 100 MCG tablet TAKE (1) TABLET BY MOUTH ONCE DAILY.  . metFORMIN (GLUCOPHAGE) 500 MG tablet Take 1 tablet (500 mg total) by mouth daily with breakfast.  . mupirocin ointment (BACTROBAN) 2 % Apply topically 2 (two) times daily as needed.  Glory Rosebush ULTRA test strip daily.  . pantoprazole (PROTONIX) 40 MG tablet TAKE (1) TABLET BY MOUTH AT BEDTIME. (Patient taking differently: Take 40 mg by mouth at bedtime. TAKE (1) TABLET BY MOUTH AT BEDTIME.)  . PERFOROMIST 20 MCG/2ML nebulizer solution USE 1 VIAL  IN  NEBULIZER TWICE  DAILY - Morning and evening  . potassium chloride SA (KLOR-CON) 10 MEQ tablet Take 1 tablet (10 mEq total) by mouth daily.  Marland Kitchen spironolactone (ALDACTONE) 25 MG tablet Take 1 tablet (25 mg total) by mouth daily.  . SYMBICORT 80-4.5 MCG/ACT inhaler Inhale into the lungs.  . triamcinolone (KENALOG) 0.025 % ointment Apply 1 application topically 2 (two) times daily.     [place] Office Visit from 10/14/2020 in [facility]  PHQ-9 Total Score 0      Objective:   Today's Vitals: BP 136/80   Resp 18   Ht '5\' 11"'  (1.803 m)   Wt 269 lb 4.8 oz (122.2 kg)   SpO2 99%   BMI 37.56 kg/m  Vitals with BMI 01/24/2021  12/08/2020 12/03/2020  Height '5\' 11"'  '5\' 11"'  '5\' 11"'   Weight 269 lbs 5 oz 271 lbs 6 oz 272 lbs  BMI 37.58 59.93 57.01  Systolic 779 390 300  Diastolic 80 70 93  Pulse - 69 70     Physical Exam  He does not appear to be in pain at the present time.  He has difficulty in getting up from a seated chair without pain.     Assessment   1. Chronic right hip pain   2. Chronic midline back pain, unspecified back location  Tests ordered Orders Placed This Encounter  Procedures  . Ambulatory referral to Orthopedic Surgery     Plan: 1. I will refer him to orthopedics as soon as possible to get further evaluation. 2. Follow-up as previously scheduled.   No orders of the defined types were placed in this encounter.   Doree Albee, MD

## 2021-01-24 NOTE — Progress Notes (Signed)
Cc: PAIN  Hip upward to  the back. Between hip & knee it hurts bad. Most of time it hurts, but it bearable. But then now it makes him cry at least 1 every 2 weeks. Cannot take OTC IB or Tylenol because of blood pressure medicines. So he said he was given before hydrocodone to help before. He went to [place] the last 2-3 times but was not able to do it. And felt terrible. His sister have been coming to get him to go dance and get him out of house more. But it it now he cant enjoy things.

## 2021-01-25 ENCOUNTER — Ambulatory Visit (INDEPENDENT_AMBULATORY_CARE_PROVIDER_SITE_OTHER): Payer: Medicare Other | Admitting: Nurse Practitioner

## 2021-01-27 ENCOUNTER — Encounter: Payer: Self-pay | Admitting: Orthopedic Surgery

## 2021-02-01 ENCOUNTER — Ambulatory Visit (INDEPENDENT_AMBULATORY_CARE_PROVIDER_SITE_OTHER): Payer: Medicare Other | Admitting: Nurse Practitioner

## 2021-02-01 ENCOUNTER — Other Ambulatory Visit: Payer: Self-pay

## 2021-02-01 ENCOUNTER — Telehealth (INDEPENDENT_AMBULATORY_CARE_PROVIDER_SITE_OTHER): Payer: Self-pay | Admitting: Nurse Practitioner

## 2021-02-01 VITALS — BP 160/100 | HR 86 | Temp 97.5°F | Ht 71.0 in | Wt 266.2 lb

## 2021-02-01 DIAGNOSIS — Z0100 Encounter for examination of eyes and vision without abnormal findings: Secondary | ICD-10-CM | POA: Diagnosis not present

## 2021-02-01 DIAGNOSIS — Z0001 Encounter for general adult medical examination with abnormal findings: Secondary | ICD-10-CM

## 2021-02-01 DIAGNOSIS — Z0000 Encounter for general adult medical examination without abnormal findings: Secondary | ICD-10-CM

## 2021-02-01 DIAGNOSIS — Z1211 Encounter for screening for malignant neoplasm of colon: Secondary | ICD-10-CM

## 2021-02-01 NOTE — Patient Instructions (Addendum)
(  [phone] - [facility] PLEASE CALL THEM TO GET THIS APPOINTMENT SCHEDULED    Bruce Barrera , Thank you for taking time to come for your Medicare Wellness Visit. I appreciate your ongoing commitment to your health goals. Please review the following plan we discussed and let me know if I can assist you in the future.   These are the goals we discussed: Goals   None     This is a list of the screening recommended for you and due dates:  Health Maintenance  Topic Date Due  . Colon Cancer Screening  03/22/2021  . Tetanus Vaccine  03/28/2027  . COVID-19 Vaccine  Completed  . Hepatitis C Screening: USPSTF Recommendation to screen - Ages 14-79 yo.  Completed  . Pneumonia vaccines  Completed  . HPV Vaccine  Aged Out  . Flu Shot  Discontinued

## 2021-02-01 NOTE — Telephone Encounter (Signed)
Just FYI, referral ordered to optometry for eye exam. He needs to be seen in [place] or [place]. He prefers to go to the [facility] if possible.   Also ordering cologuard. Please get this paperwork completed and fax when able to. I will get patient to sign it and leave it on your desk. Thank you.

## 2021-02-01 NOTE — Progress Notes (Signed)
Subjective:   Bruce Barrera is a 70 y.o. male who presents for Medicare Annual/Subsequent preventive examination.  Review of Systems     Cardiac Risk Factors include: advanced age (>70mn, >>37women);male gender;hypertension;obesity (BMI >30kg/m2);smoking/ tobacco exposure     Objective:    Today's Vitals   02/01/21 1453  BP: (!) 160/100  Pulse: 86  Temp: (!) 97.5 F (36.4 C)  SpO2: 90%  Weight: 266 lb 3.2 oz (120.7 kg)  Height: _0  (1.803 m)   Body mass index is 37.13 kg/m.  Advanced Directives 02/01/2021 12/08/2019 10/08/2019 10/06/2019 09/15/2019 09/09/2019 09/09/2019  Does Patient Have a Medical Advance Directive? No No Yes No No No No  Type of Advance Directive - - Living will - - - -  Does patient want to make changes to medical advance directive? - - No - Patient declined - - - -  Would patient like information on creating a medical advance directive? No - Patient declined No - Patient declined - No - Patient declined No - Patient declined No - Patient declined -    Current Medications (verified) Outpatient Encounter Medications as of 02/01/2021  Medication Sig  . albuterol (VENTOLIN HFA) 108 (90 Base) MCG/ACT inhaler INHALE 2 PUFFS INTO THE LUNGS EVERY 6 HOURS AS NEEDED FOR WHEEZING.  .Marland Kitchenaspirin 81 MG tablet Take 1 tablet (81 mg total) by mouth daily with breakfast.  . atorvastatin (LIPITOR) 80 MG tablet Take 1 tablet (80 mg total) by mouth daily.  . blood glucose meter kit and supplies KIT Dispense based on patient and insurance preference. Use to check your blood sugar daily before your first meal.  . budesonide (PULMICORT) 0.25 MG/2ML nebulizer solution USE 1 VIAL  IN  NEBULIZER TWICE  DAILY - Rinse mouth after each use  . calcium-vitamin D (OSCAL WITH D) 500-200 MG-UNIT tablet Take 1 tablet by mouth daily with breakfast.  . carvedilol (COREG) 25 MG tablet Take 1 tablet (25 mg total) by mouth 2 (two) times daily.  . Cholecalciferol (VITAMIN D3) 50 MCG (2000 UT)  TABS Take 1 tablet by mouth daily.  . clopidogrel (PLAVIX) 75 MG tablet Take 1 tablet (75 mg total) by mouth daily.  . cyanocobalamin 100 MCG tablet Take 100 mcg by mouth daily.  .Marland KitchenENTRESTO 49-51 MG TAKE (1) TABLET BY MOUTH TWICE DAILY.  . furosemide (LASIX) 40 MG tablet TAKE 1 AND 1/2 TABLETS BY MOUTH ONCE DAILY.  .Marland KitchenLancets (ONETOUCH DELICA PLUS LNTIRWE31V MISC Apply topically daily.  .Marland Kitchenlevothyroxine (SYNTHROID) 100 MCG tablet TAKE (1) TABLET BY MOUTH ONCE DAILY.  . metFORMIN (GLUCOPHAGE) 500 MG tablet Take 1 tablet (500 mg total) by mouth daily with breakfast.  . mupirocin ointment (BACTROBAN) 2 % Apply topically 2 (two) times daily as needed.  .Glory RosebushULTRA test strip daily.  . pantoprazole (PROTONIX) 40 MG tablet TAKE (1) TABLET BY MOUTH AT BEDTIME. (Patient taking differently: Take 40 mg by mouth at bedtime. TAKE (1) TABLET BY MOUTH AT BEDTIME.)  . PERFOROMIST 20 MCG/2ML nebulizer solution USE 1 VIAL  IN  NEBULIZER TWICE  DAILY - Morning and evening  . potassium chloride SA (KLOR-CON) 10 MEQ tablet Take 1 tablet (10 mEq total) by mouth daily.  .Marland Kitchenspironolactone (ALDACTONE) 25 MG tablet Take 1 tablet (25 mg total) by mouth daily.  . SYMBICORT 80-4.5 MCG/ACT inhaler Inhale into the lungs.  . triamcinolone (KENALOG) 0.025 % ointment Apply 1 application topically 2 (two) times daily.   No facility-administered encounter medications  on file as of 02/01/2021.    Allergies (verified) Patient has no known allergies.   History: Past Medical History:  Diagnosis Date  . AAA (abdominal aortic aneurysm) ([place])   . Arthritis    Hips and knees  . Back pain   . Chronic systolic CHF (congestive heart failure) ([place])   . Coronary artery disease    a. 03/2016 subacute anterior MI -> 100% prox LAD >72 hours out thus treated medically, LVEF 30-35% initially  . Essential hypertension   . GERD (gastroesophageal reflux disease)   . Hemorrhoids   . History of stroke    States "stroke" with left sided  weakness 5/95 in prison in [place]  . Hyperlipidemia   . Hypothyroidism   . Ischemic cardiomyopathy   . MI (myocardial infarction) ([place])    States "heart attack" 2/95 in prison in [place]  . Obesity   . Peptic ulcer    GIB 1994  . Sleep apnea   . Vitamin D deficiency disease 06/19/2019   Past Surgical History:  Procedure Laterality Date  . ABDOMINAL AORTIC ENDOVASCULAR STENT GRAFT N/A 09/09/2019   Procedure: ABDOMINAL AORTIC ENDOVASCULAR STENT GRAFT;  Surgeon: Waynetta Sandy, MD;  Location: [facility];  Service: Vascular;  Laterality: N/A;  . ADJUSTABLE SUTURE MANIPULATION Left 04/10/2018   Procedure: ADJUSTABLE SUTURE MANIPULATION;  Surgeon: Gevena Cotton, MD;  Location: [facility];  Service: Ophthalmology;  Laterality: Left;  . CARDIAC CATHETERIZATION N/A 03/15/2016   Procedure: Right/Left Heart Cath and Coronary Angiography;  Surgeon: Wellington Hampshire, MD;  Location: [facility];  Service: Cardiovascular;  Laterality: N/A;  . COLONOSCOPY  03/23/2011   hemorrhoids  . KNEE ARTHROSCOPY     Right knee x 2  . MEDIAN RECTUS REPAIR Left 04/10/2018   Procedure: LEFT MEDIAN RECTUS REPAIR;  Surgeon: Gevena Cotton, MD;  Location: [facility];  Service: Ophthalmology;  Laterality: Left;  Marland Kitchen MUSCLE RECESSION AND RESECTION Left 04/10/2018   Procedure: LEFT MUSCLE RECESSION;  Surgeon: Gevena Cotton, MD;  Location: [facility];  Service: Ophthalmology;  Laterality: Left;  . TOTAL HIP ARTHROPLASTY Right 2014   Family History  Problem Relation Age of Onset  . Hypertension Mother   . Diabetes Mother   . Heart disease Mother   . Stroke Mother   . Stroke Father   . Heart attack Brother   . Aneurysm Brother   . Cirrhosis Brother    Social History   Socioeconomic History  . Marital status: Divorced    Spouse name: Not on file  . Number of children: Not on file  . Years of education: Not on file  . Highest education level: Not on file  Occupational History  . Occupation: Disabled    Comment:  [employer]  Tobacco Use  . Smoking status: Current Every Day Smoker    Packs/day: 1.50    Years: 58.00    Pack years: 87.00    Types: Cigarettes    Start date: 05/18/1961  . Smokeless tobacco: Never Used  . Tobacco comment: smokes 35 cigarettes a day (06/02/2020)  Vaping Use  . Vaping Use: Never used  Substance and Sexual Activity  . Alcohol use: No  . Drug use: No  . Sexual activity: Not on file  Other Topics Concern  . Not on file  Social History Narrative   Lives with girlfriend for last 8 years.On disability .   Social Determinants of Health   Financial Resource Strain: Not on file  Food Insecurity: Not on file  Transportation Needs: Not on file  Physical Activity: Not on file  Stress: Not on file  Social Connections: Not on file    Tobacco Counseling Ready to quit: Not Answered Counseling given: Not Answered Comment: smokes 35 cigarettes a day (06/02/2020)   Clinical Intake:  Pre-visit preparation completed: Yes  Pain : No/denies pain     BMI - recorded: 37.13 Nutritional Status: BMI > 30  Obese Nutritional Risks: None Diabetes: Yes CBG done?: No Did pt. bring in CBG monitor from home?: No (Patient did not check it this morning)  How often do you need to have someone help you when you read instructions, pamphlets, or other written materials from your doctor or pharmacy?: 3 - Sometimes What is the last grade level you completed in school?: 9th grade  Diabetic? Yes  Interpreter Needed?: No  Information entered by :: Jeralyn Ruths, NP-C   Activities of Daily Living In your present state of health, do you have any difficulty performing the following activities: 02/01/2021 01/24/2021  Hearing? N N  Vision? Y Y  Comment - READING GLASSES otc BRAND.  Difficulty concentrating or making decisions? Y Y  Comment - ON OCCASIONAL  Walking or climbing stairs? Y Y  Dressing or bathing? N N  Doing errands, shopping? N N  Preparing Food and eating ? N -  Using the  Toilet? N -  In the past six months, have you accidently leaked urine? N -  Do you have problems with loss of bowel control? N -  Managing your Finances? N -  Housekeeping or managing your Housekeeping? N -  Some recent data might be hidden    Patient Care Team: Doree Albee, MD as PCP - General (Internal Medicine) Satira Sark, MD as PCP - Cardiology (Cardiology)  Indicate any recent Medical Services you may have received from other than [facility] providers in the past year (date may be approximate).     Assessment:   This is a routine wellness examination for [place].  Hearing/Vision screen No exam data present  Dietary issues and exercise activities discussed: Current Exercise Habits: The patient does not participate in regular exercise at present, Exercise limited by: orthopedic condition(s);respiratory conditions(s)  Goals Addressed   None    Depression Screen PHQ 2/9 Scores 02/01/2021 11/03/2020 10/14/2020 06/19/2019  PHQ - 2 Score 0 0 0 0  PHQ- 9 Score - - 0 -  Exception Documentation - - - Medical reason    Fall Risk Fall Risk  02/01/2021 12/08/2020 11/03/2020 10/14/2020 06/19/2019  Falls in the past year? 0 0 0 0 0  Number falls in past yr: 0 - 0 - 0  Injury with Fall? 0 - 0 - 0  Risk for fall due to : Impaired balance/gait - No Fall Risks - -  Follow up - - Falls evaluation completed - Falls evaluation completed    FALL RISK PREVENTION PERTAINING TO THE HOME:  Any stairs in or around the home? No  If so, are there any without handrails? N/A Home free of loose throw rugs in walkways, pet beds, electrical cords, etc? Yes  Adequate lighting in your home to reduce risk of falls? Yes   ASSISTIVE DEVICES UTILIZED TO PREVENT FALLS:  Life alert? No  Use of a cane, walker or w/c? Yes  Grab bars in the bathroom? Yes  Shower chair or bench in shower? Yes  Elevated toilet seat or a handicapped toilet? No   TIMED UP AND GO:  Was the  test performed? Yes .  Length of  time to ambulate 10 feet: 8 sec.   Gait steady and fast with assistive device  Cognitive Function:     6CIT Screen 02/01/2021  What Year? 0 points  What month? 0 points  What time? 0 points  Count back from 20 0 points  Months in reverse 0 points  Repeat phrase 0 points  Total Score 0    Immunizations Immunization History  Administered Date(s) Administered  . Influenza, High Dose Seasonal PF 07/24/2017  . PFIZER(Purple Top)SARS-COV-2 Vaccination 12/25/2019, 01/16/2020  . Pneumococcal Conjugate-13 11/21/2017  . Pneumococcal Polysaccharide-23 11/21/2016, 02/03/2020  . Tdap 03/29/2015, 03/27/2017    TDAP status: Up to date  Flu Vaccine status: Declined, Education has been provided regarding the importance of this vaccine but patient still declined. Advised may receive this vaccine at local pharmacy or Health Dept. Aware to provide a copy of the vaccination record if obtained from local pharmacy or Health Dept. Verbalized acceptance and understanding.  Pneumococcal vaccine status: Up to date  Covid-19 vaccine status: Completed vaccines  Qualifies for Shingles Vaccine? No   Zostavax completed No   Shingrix Completed?: No.    Education has been provided regarding the importance of this vaccine. Patient has been advised to call insurance company to determine out of pocket expense if they have not yet received this vaccine. Advised may also receive vaccine at local pharmacy or Health Dept. Verbalized acceptance and understanding.  Screening Tests Health Maintenance  Topic Date Due  . COVID-19 Vaccine (3 - Booster for Pfizer series) 06/17/2020  . COLONOSCOPY (Pts 45-53yr Insurance coverage will need to be confirmed)  03/22/2021  . TETANUS/TDAP  03/28/2027  . Hepatitis C Screening  Completed  . PNA vac Low Risk Adult  Completed  . HPV VACCINES  Aged Out  . INFLUENZA VACCINE  Discontinued    Health Maintenance  Health Maintenance Due  Topic Date Due  . COVID-19 Vaccine (3  - Booster for Pfizer series) 06/17/2020    Colorectal cancer screening: Type of screening: Colonoscopy. Completed 2012. Repeat every 10 years  Lung Cancer Screening: (Low Dose CT Chest recommended if Age 69-80years, 30 pack-year currently smoking OR have quit w/in 15years.) does qualify.   Lung Cancer Screening Referral: Refused by patient today  Additional Screening:  Hepatitis C Screening: does not qualify; Completed 06/2019  Vision Screening: Recommended annual ophthalmology exams for early detection of glaucoma and other disorders of the eye. Is the patient up to date with their annual eye exam?  No  Who is the provider or what is the name of the office in which the patient attends annual eye exams? N/A If pt is not established with a provider, would they like to be referred to a provider to establish care? Yes .   Dental Screening: Recommended annual dental exams for proper oral hygiene  Community Resource Referral / Chronic Care Management: CRR required this visit?  No   CCM required this visit?  No      Plan:    Patient encouraged to get his second COVID-19 booster as soon as possible.  He plans on doing this.  Also encouraged to talk to his pharmacist about get shingles vaccine administered.  Blood pressure was slightly elevated today, however he mentioned he is under a lot of emotional stress as he is moving due to a conflict between him and his significant other's granddaughter.  He does not report feeling unsafe or abused, but has been under  some stress.  He denies any chest pain, difficulty breathing, headache, or any blurred vision.  Previous office visit showed that blood pressures been much better controlled.  For now we will just continue to monitor closely he does have an at home cuff.  He was told once he is moved into his new place to monitor his blood pressure and if blood pressure remains elevated he needs to notify us.  He was also told if he experiences any sudden  onset of headache, chest pain, shortness of breath needs further emergency department.  He tells me he understands.  We did discuss lung cancer screening with CT scan he like to hold off on this for now.  He would like to undergo colon cancer screening via Cologuard.  He denies any personal or family history of colon cancer as well as any personal history of inflammatory bowel disease or ulcerative colitis.  We will place this order today.  I have personally reviewed and noted the following in the patient's chart:   . Medical and social history . Use of alcohol, tobacco or illicit drugs  . Current medications and supplements including opioid prescriptions. Patient is not currently taking opioid prescriptions. . Functional ability and status . Nutritional status . Physical activity . Advanced directives . List of other physicians . Hospitalizations, surgeries, and ER visits in previous 12 months . Vitals . Screenings to include cognitive, depression, and falls . Referrals and appointments  In addition, I have reviewed and discussed with patient certain preventive protocols, quality metrics, and best practice recommendations. A written personalized care plan for preventive services as well as general preventive health recommendations were provided to patient.   He will follow-up as scheduled in approximately 3 months or sooner as needed.  Ailene Ards, NP   02/01/2021

## 2021-02-02 DIAGNOSIS — J449 Chronic obstructive pulmonary disease, unspecified: Secondary | ICD-10-CM | POA: Diagnosis not present

## 2021-02-03 NOTE — Telephone Encounter (Signed)
Both completed & sent referral to Dr Glenna Durand in [facility].

## 2021-02-16 DIAGNOSIS — J449 Chronic obstructive pulmonary disease, unspecified: Secondary | ICD-10-CM | POA: Diagnosis not present

## 2021-02-24 DIAGNOSIS — J449 Chronic obstructive pulmonary disease, unspecified: Secondary | ICD-10-CM | POA: Diagnosis not present

## 2021-02-28 ENCOUNTER — Other Ambulatory Visit (INDEPENDENT_AMBULATORY_CARE_PROVIDER_SITE_OTHER): Payer: Self-pay | Admitting: Nurse Practitioner

## 2021-02-28 ENCOUNTER — Other Ambulatory Visit (INDEPENDENT_AMBULATORY_CARE_PROVIDER_SITE_OTHER): Payer: Self-pay | Admitting: Internal Medicine

## 2021-02-28 ENCOUNTER — Telehealth (INDEPENDENT_AMBULATORY_CARE_PROVIDER_SITE_OTHER): Payer: Self-pay

## 2021-02-28 DIAGNOSIS — I10 Essential (primary) hypertension: Secondary | ICD-10-CM

## 2021-02-28 DIAGNOSIS — I5032 Chronic diastolic (congestive) heart failure: Secondary | ICD-10-CM

## 2021-02-28 NOTE — Telephone Encounter (Signed)
Patient called and left a voice message and stated that he is still hurting in his back and hip and he is requesting an order for a Walker as he needs help due to pain and feels this will help him.

## 2021-02-28 NOTE — Telephone Encounter (Signed)
He has an appointment with orthopedics in 1 week and he should keep this before we decide about a walker.  I am not keen to prescribe a walker yet until he has seen an orthopedic surgeon.  Make sure he keeps the appointment.  Thank you

## 2021-02-28 NOTE — Telephone Encounter (Signed)
Called patient back and not able to leave a voice message. I will try back again at a different time.

## 2021-02-28 NOTE — Telephone Encounter (Signed)
Called patient back and gave him the message from Dr. Karilyn Cota. Patient verbalized an understanding.

## 2021-03-05 DIAGNOSIS — J449 Chronic obstructive pulmonary disease, unspecified: Secondary | ICD-10-CM | POA: Diagnosis not present

## 2021-03-07 ENCOUNTER — Ambulatory Visit: Payer: Medicare Other | Admitting: Orthopedic Surgery

## 2021-03-18 DIAGNOSIS — J449 Chronic obstructive pulmonary disease, unspecified: Secondary | ICD-10-CM | POA: Diagnosis not present

## 2021-03-24 ENCOUNTER — Ambulatory Visit: Payer: Medicare Other | Admitting: Orthopedic Surgery

## 2021-04-12 ENCOUNTER — Other Ambulatory Visit: Payer: Self-pay | Admitting: Student

## 2021-04-18 ENCOUNTER — Telehealth (INDEPENDENT_AMBULATORY_CARE_PROVIDER_SITE_OTHER): Payer: Self-pay

## 2021-04-18 DIAGNOSIS — I5032 Chronic diastolic (congestive) heart failure: Secondary | ICD-10-CM

## 2021-04-18 DIAGNOSIS — J449 Chronic obstructive pulmonary disease, unspecified: Secondary | ICD-10-CM

## 2021-04-18 NOTE — Telephone Encounter (Signed)
Order placed, you may need to print it out for him to bring with him to the medical device stor.

## 2021-04-18 NOTE — Telephone Encounter (Signed)
Patient called and stated that he was not able to keep his ortho appointment and he really needs a walker to help him get around because he has COPD and his balance is not good. Patient has his car in the shop and not able to schedule ortho appointment until his car is fixed. Can you send an order for a walker?

## 2021-04-19 NOTE — Telephone Encounter (Signed)
Called patient and not able to leave a voice message. I will try back another time.

## 2021-04-20 ENCOUNTER — Ambulatory Visit (INDEPENDENT_AMBULATORY_CARE_PROVIDER_SITE_OTHER): Payer: Medicare Other | Admitting: Nurse Practitioner

## 2021-04-21 NOTE — Telephone Encounter (Signed)
Called patient and he thanks you very much for helping him! Can you print the order and I will leave at the front desk for him to pick up on Wednesday? The system will not let me print the order.

## 2021-04-21 NOTE — Addendum Note (Signed)
Addended by: Elenore Paddy on: 04/21/2021 05:13 PM   Modules accepted: Orders

## 2021-04-21 NOTE — Telephone Encounter (Signed)
I have printed the prescription and left it by the front computer.

## 2021-04-27 DIAGNOSIS — I509 Heart failure, unspecified: Secondary | ICD-10-CM | POA: Diagnosis not present

## 2021-04-27 DIAGNOSIS — J449 Chronic obstructive pulmonary disease, unspecified: Secondary | ICD-10-CM | POA: Diagnosis not present

## 2021-05-10 ENCOUNTER — Ambulatory Visit: Payer: Medicare Other | Admitting: Cardiology

## 2021-05-25 ENCOUNTER — Other Ambulatory Visit (INDEPENDENT_AMBULATORY_CARE_PROVIDER_SITE_OTHER): Payer: Self-pay | Admitting: Nurse Practitioner

## 2021-05-25 DIAGNOSIS — J449 Chronic obstructive pulmonary disease, unspecified: Secondary | ICD-10-CM

## 2021-05-27 DIAGNOSIS — J449 Chronic obstructive pulmonary disease, unspecified: Secondary | ICD-10-CM | POA: Diagnosis not present

## 2021-06-14 ENCOUNTER — Other Ambulatory Visit (INDEPENDENT_AMBULATORY_CARE_PROVIDER_SITE_OTHER): Payer: Self-pay | Admitting: Nurse Practitioner

## 2021-06-16 ENCOUNTER — Other Ambulatory Visit (INDEPENDENT_AMBULATORY_CARE_PROVIDER_SITE_OTHER): Payer: Self-pay | Admitting: Nurse Practitioner

## 2021-06-18 DIAGNOSIS — J449 Chronic obstructive pulmonary disease, unspecified: Secondary | ICD-10-CM | POA: Diagnosis not present

## 2021-06-21 DIAGNOSIS — J449 Chronic obstructive pulmonary disease, unspecified: Secondary | ICD-10-CM | POA: Diagnosis not present

## 2021-07-13 ENCOUNTER — Encounter: Payer: Self-pay | Admitting: Family Medicine

## 2021-07-13 ENCOUNTER — Other Ambulatory Visit: Payer: Self-pay

## 2021-07-13 ENCOUNTER — Ambulatory Visit (INDEPENDENT_AMBULATORY_CARE_PROVIDER_SITE_OTHER): Payer: Medicare Other | Admitting: Family Medicine

## 2021-07-13 VITALS — BP 163/90 | HR 74 | Temp 97.3°F | Ht 71.0 in | Wt 278.0 lb

## 2021-07-13 DIAGNOSIS — I152 Hypertension secondary to endocrine disorders: Secondary | ICD-10-CM

## 2021-07-13 DIAGNOSIS — E785 Hyperlipidemia, unspecified: Secondary | ICD-10-CM | POA: Diagnosis not present

## 2021-07-13 DIAGNOSIS — K219 Gastro-esophageal reflux disease without esophagitis: Secondary | ICD-10-CM | POA: Diagnosis not present

## 2021-07-13 DIAGNOSIS — Z1211 Encounter for screening for malignant neoplasm of colon: Secondary | ICD-10-CM | POA: Diagnosis not present

## 2021-07-13 DIAGNOSIS — F17210 Nicotine dependence, cigarettes, uncomplicated: Secondary | ICD-10-CM | POA: Diagnosis not present

## 2021-07-13 DIAGNOSIS — E039 Hypothyroidism, unspecified: Secondary | ICD-10-CM | POA: Diagnosis not present

## 2021-07-13 DIAGNOSIS — E559 Vitamin D deficiency, unspecified: Secondary | ICD-10-CM

## 2021-07-13 DIAGNOSIS — I2510 Atherosclerotic heart disease of native coronary artery without angina pectoris: Secondary | ICD-10-CM | POA: Diagnosis not present

## 2021-07-13 DIAGNOSIS — Z95828 Presence of other vascular implants and grafts: Secondary | ICD-10-CM

## 2021-07-13 DIAGNOSIS — E1169 Type 2 diabetes mellitus with other specified complication: Secondary | ICD-10-CM | POA: Diagnosis not present

## 2021-07-13 DIAGNOSIS — I700 Atherosclerosis of aorta: Secondary | ICD-10-CM | POA: Insufficient documentation

## 2021-07-13 DIAGNOSIS — J449 Chronic obstructive pulmonary disease, unspecified: Secondary | ICD-10-CM | POA: Diagnosis not present

## 2021-07-13 DIAGNOSIS — E119 Type 2 diabetes mellitus without complications: Secondary | ICD-10-CM | POA: Insufficient documentation

## 2021-07-13 DIAGNOSIS — I5032 Chronic diastolic (congestive) heart failure: Secondary | ICD-10-CM

## 2021-07-13 DIAGNOSIS — I701 Atherosclerosis of renal artery: Secondary | ICD-10-CM | POA: Insufficient documentation

## 2021-07-13 DIAGNOSIS — E1159 Type 2 diabetes mellitus with other circulatory complications: Secondary | ICD-10-CM

## 2021-07-13 DIAGNOSIS — Z1212 Encounter for screening for malignant neoplasm of rectum: Secondary | ICD-10-CM

## 2021-07-13 LAB — BAYER DCA HB A1C WAIVED: HB A1C (BAYER DCA - WAIVED): 7.1 % — ABNORMAL HIGH (ref 4.8–5.6)

## 2021-07-13 MED ORDER — CARVEDILOL 25 MG PO TABS: 25.0000 mg | ORAL_TABLET | Freq: Two times a day (BID) | ORAL | 3 refills | Status: AC

## 2021-07-13 MED ORDER — PANTOPRAZOLE SODIUM 40 MG PO TBEC: DELAYED_RELEASE_TABLET | ORAL | 3 refills | Status: AC

## 2021-07-13 MED ORDER — ATORVASTATIN CALCIUM 80 MG PO TABS: 80.0000 mg | ORAL_TABLET | Freq: Every day | ORAL | 3 refills | Status: AC

## 2021-07-13 MED ORDER — BREZTRI AEROSPHERE 160-9-4.8 MCG/ACT IN AERO: 2.0000 | INHALATION_SPRAY | Freq: Two times a day (BID) | RESPIRATORY_TRACT | 11 refills | Status: AC

## 2021-07-13 MED ORDER — ALBUTEROL SULFATE HFA 108 (90 BASE) MCG/ACT IN AERS: INHALATION_SPRAY | RESPIRATORY_TRACT | 6 refills | Status: AC

## 2021-07-13 MED ORDER — OZEMPIC (0.25 OR 0.5 MG/DOSE) 2 MG/1.5ML ~~LOC~~ SOPN: 0.2500 mg | PEN_INJECTOR | SUBCUTANEOUS | 0 refills | Status: AC

## 2021-07-13 MED ORDER — POTASSIUM CHLORIDE CRYS ER 10 MEQ PO TBCR: 10.0000 meq | EXTENDED_RELEASE_TABLET | Freq: Every day | ORAL | 1 refills | Status: AC

## 2021-07-13 MED ORDER — OZEMPIC (0.25 OR 0.5 MG/DOSE) 2 MG/1.5ML ~~LOC~~ SOPN: 0.5000 mg | PEN_INJECTOR | SUBCUTANEOUS | 6 refills | Status: AC

## 2021-07-13 MED ORDER — LEVOTHYROXINE SODIUM 100 MCG PO TABS: 100.0000 ug | ORAL_TABLET | Freq: Every day | ORAL | 2 refills | Status: AC

## 2021-07-13 MED ORDER — ENTRESTO 49-51 MG PO TABS: ORAL_TABLET | ORAL | 3 refills | Status: AC

## 2021-07-13 MED ORDER — METFORMIN HCL 500 MG PO TABS: 500.0000 mg | ORAL_TABLET | Freq: Every day | ORAL | 1 refills | Status: DC

## 2021-07-13 MED ORDER — CLOPIDOGREL BISULFATE 75 MG PO TABS: 75.0000 mg | ORAL_TABLET | Freq: Every day | ORAL | 2 refills | Status: AC

## 2021-07-13 MED ORDER — SPIRONOLACTONE 25 MG PO TABS: 25.0000 mg | ORAL_TABLET | Freq: Every day | ORAL | 3 refills | Status: DC

## 2021-07-13 NOTE — Progress Notes (Signed)
Subjective:  Patient ID: Bruce Barrera, male    DOB: 1950/11/09, 70 y.o.   MRN: [id number]  Patient Care Team: Baruch Gouty, FNP as PCP - General (Family Medicine) Satira Sark, MD as PCP - Cardiology (Cardiology)   Chief Complaint:  New Patient (Initial Visit) (Medication mgmt: COPD, Thyroid, Diabetes)   HPI: Bruce Barrera is a 70 y.o. male presenting on 07/13/2021 for New Patient (Initial Visit) (Medication mgmt: COPD, Thyroid, Diabetes)  Pt presents today to establish care with new PCP as his passed away. He was last seen over 3 months ago and reports he needs all of his medications refilled. He has a complex and extensive medical history. He has CAD with prior MI,  ischemic cardiomyopathy, chronic diastolic heart failure, AAA s/p rupture with repair, aortic atherosclerosis, hyperlipidemia, hypertension, right renal artery stenosis with right renal atrophy, DM, GERD, morbid obesity, COPD with continued nicotine dependence, and hypothyroidism. He has not had labs since 11/2020. He states he has been taking his medications sparingly due to not wanting to run out. He has not been checking his blood pressure or blood sugar at home. He is out of his COPD medications and reports he did not feel his symptoms were well controlled on prior regimen. He was seen by pulmonology but did not follow up as discussed. He is followed by cardiology and vascular surgery on a regular basis. He is due for an eye exam. He does see podiatry on a yearly basis. He needs referral for colonoscopy as this is past due.   Relevant past medical, surgical, family, and social history reviewed and updated as indicated.  Allergies and medications reviewed and updated. Data reviewed: Chart in Epic.   Past Medical History:  Diagnosis Date   AAA (abdominal aortic aneurysm)    Arthritis    Hips and knees   Back pain    Chronic systolic CHF (congestive heart failure) (HCC)    Coronary artery disease    a.  03/2016 subacute anterior MI -> 100% prox LAD >72 hours out thus treated medically, LVEF 30-35% initially   Essential hypertension    GERD (gastroesophageal reflux disease)    Hemorrhoids    History of stroke    States "stroke" with left sided weakness 5/95 in prison in [place]   Hyperlipidemia    Hypothyroidism    Ischemic cardiomyopathy    MI (myocardial infarction) ([place])    States "heart attack" 2/95 in prison in [place]   Obesity    Peptic ulcer    GIB 1994   Sleep apnea    Vitamin D deficiency disease 06/19/2019    Past Surgical History:  Procedure Laterality Date   ABDOMINAL AORTIC ENDOVASCULAR STENT GRAFT N/A 09/09/2019   Procedure: ABDOMINAL AORTIC ENDOVASCULAR STENT GRAFT;  Surgeon: Waynetta Sandy, MD;  Location: [facility];  Service: Vascular;  Laterality: N/A;   ADJUSTABLE SUTURE MANIPULATION Left 04/10/2018   Procedure: ADJUSTABLE SUTURE MANIPULATION;  Surgeon: Gevena Cotton, MD;  Location: [facility];  Service: Ophthalmology;  Laterality: Left;   CARDIAC CATHETERIZATION N/A 03/15/2016   Procedure: Right/Left Heart Cath and Coronary Angiography;  Surgeon: Wellington Hampshire, MD;  Location: [facility];  Service: Cardiovascular;  Laterality: N/A;   COLONOSCOPY  03/23/2011   hemorrhoids   KNEE ARTHROSCOPY     Right knee x 2   MEDIAN RECTUS REPAIR Left 04/10/2018   Procedure: LEFT MEDIAN RECTUS REPAIR;  Surgeon: Gevena Cotton, MD;  Location: [facility];  Service: Ophthalmology;  Laterality: Left;   MUSCLE RECESSION AND RESECTION Left 04/10/2018   Procedure: LEFT MUSCLE RECESSION;  Surgeon: Gevena Cotton, MD;  Location: [facility];  Service: Ophthalmology;  Laterality: Left;   TOTAL HIP ARTHROPLASTY Right 2014    Social History   Socioeconomic History   Marital status: Divorced    Spouse name: Not on file   Number of children: Not on file   Years of education: Not on file   Highest education level: Not on file  Occupational History   Occupation: Disabled    Comment:  [employer]  Tobacco Use   Smoking status: Every Day    Packs/day: 1.50    Years: 58.00    Pack years: 87.00    Types: Cigarettes    Start date: 05/18/1961   Smokeless tobacco: Never   Tobacco comments:    smokes 35 cigarettes a day (06/02/2020)  Vaping Use   Vaping Use: Never used  Substance and Sexual Activity   Alcohol use: No   Drug use: No   Sexual activity: Not on file  Other Topics Concern   Not on file  Social History Narrative   Lives with girlfriend for last 8 years.On disability .   Social Determinants of Health   Financial Resource Strain: Not on file  Food Insecurity: Not on file  Transportation Needs: Not on file  Physical Activity: Not on file  Stress: Not on file  Social Connections: Not on file  Intimate Partner Violence: Not on file    Outpatient Encounter Medications as of 07/13/2021  Medication Sig   aspirin 81 MG tablet Take 1 tablet (81 mg total) by mouth daily with breakfast.   blood glucose meter kit and supplies KIT Dispense based on patient and insurance preference. Use to check your blood sugar daily before your first meal.   Budeson-Glycopyrrol-Formoterol (BREZTRI AEROSPHERE) 160-9-4.8 MCG/ACT AERO Inhale 2 puffs into the lungs 2 (two) times daily.   calcium-vitamin D (OSCAL WITH D) 500-200 MG-UNIT tablet Take 1 tablet by mouth daily with breakfast.   Cholecalciferol (VITAMIN D3) 50 MCG (2000 UT) TABS Take 1 tablet by mouth daily.   cyanocobalamin 100 MCG tablet Take 100 mcg by mouth daily.   furosemide (LASIX) 40 MG tablet TAKE 1 AND 1/2 TABLETS BY MOUTH ONCE DAILY.   Lancets (ONETOUCH DELICA PLUS [id number]) MISC Apply topically daily.   mupirocin ointment (BACTROBAN) 2 % Apply topically 2 (two) times daily as needed.   ONETOUCH ULTRA test strip daily.   Semaglutide,0.25 or 0.5MG/DOS, (OZEMPIC, 0.25 OR 0.5 MG/DOSE,) 2 MG/1.5ML SOPN Inject 0.25 mg into the skin once a week for 4 doses.   [START ON 08/17/2021] Semaglutide,0.25 or 0.5MG/DOS, (OZEMPIC,  0.25 OR 0.5 MG/DOSE,) 2 MG/1.5ML SOPN Inject 0.5 mg into the skin once a week.   triamcinolone (KENALOG) 0.025 % ointment Apply 1 application topically 2 (two) times daily.   [DISCONTINUED] albuterol (VENTOLIN HFA) 108 (90 Base) MCG/ACT inhaler INHALE 2 PUFFS INTO THE LUNGS EVERY 6 HOURS AS NEEDED FOR WHEEZING.   [DISCONTINUED] atorvastatin (LIPITOR) 80 MG tablet Take 1 tablet (80 mg total) by mouth daily.   [DISCONTINUED] budesonide (PULMICORT) 0.25 MG/2ML nebulizer solution Take 2 mLs (0.25 mg total) by nebulization in the morning and at bedtime. No more refills will be approved by this provider as office is now permanently closed, patient will need to find another primary care provider.   [DISCONTINUED] clopidogrel (PLAVIX) 75 MG tablet TAKE ONE TABLET BY MOUTH ONCE DAILY.   [DISCONTINUED]  ENTRESTO 49-51 MG TAKE (1) TABLET BY MOUTH TWICE DAILY.   [DISCONTINUED] formoterol (PERFOROMIST) 20 MCG/2ML nebulizer solution Take 2 mLs (20 mcg total) by nebulization 2 (two) times daily. No more refills will be approved by this provider as office is now permanently closed, patient will need to find another primary care provider.   [DISCONTINUED] levothyroxine (SYNTHROID) 100 MCG tablet TAKE (1) TABLET BY MOUTH ONCE DAILY.   [DISCONTINUED] metFORMIN (GLUCOPHAGE) 500 MG tablet Take 1 tablet (500 mg total) by mouth daily with breakfast.   [DISCONTINUED] pantoprazole (PROTONIX) 40 MG tablet TAKE ONE TABLET BY MOUTH ONCE DAILY.   [DISCONTINUED] potassium chloride SA (KLOR-CON) 10 MEQ tablet Take 1 tablet (10 mEq total) by mouth daily.   [DISCONTINUED] SYMBICORT 80-4.5 MCG/ACT inhaler INHALE 2 PUFFS INTO THE LUNGS TWICE DAILY   albuterol (VENTOLIN HFA) 108 (90 Base) MCG/ACT inhaler INHALE 2 PUFFS INTO THE LUNGS EVERY 6 HOURS AS NEEDED FOR WHEEZING.   atorvastatin (LIPITOR) 80 MG tablet Take 1 tablet (80 mg total) by mouth daily.   carvedilol (COREG) 25 MG tablet Take 1 tablet (25 mg total) by mouth 2 (two) times  daily.   clopidogrel (PLAVIX) 75 MG tablet Take 1 tablet (75 mg total) by mouth daily.   levothyroxine (SYNTHROID) 100 MCG tablet Take 1 tablet (100 mcg total) by mouth daily.   metFORMIN (GLUCOPHAGE) 500 MG tablet Take 1 tablet (500 mg total) by mouth daily with breakfast.   pantoprazole (PROTONIX) 40 MG tablet TAKE ONE TABLET BY MOUTH ONCE DAILY.   potassium chloride (KLOR-CON) 10 MEQ tablet Take 1 tablet (10 mEq total) by mouth daily.   sacubitril-valsartan (ENTRESTO) 49-51 MG TAKE (1) TABLET BY MOUTH TWICE DAILY.   spironolactone (ALDACTONE) 25 MG tablet Take 1 tablet (25 mg total) by mouth daily.   [DISCONTINUED] carvedilol (COREG) 25 MG tablet Take 1 tablet (25 mg total) by mouth 2 (two) times daily.   [DISCONTINUED] spironolactone (ALDACTONE) 25 MG tablet Take 1 tablet (25 mg total) by mouth daily.   No facility-administered encounter medications on file as of 07/13/2021.    No Known Allergies  Review of Systems  Constitutional:  Positive for activity change and fatigue. Negative for appetite change, chills, diaphoresis, fever and unexpected weight change.  HENT: Negative.    Eyes: Negative.   Respiratory:  Positive for cough, shortness of breath and wheezing. Negative for apnea, choking, chest tightness and stridor.   Cardiovascular:  Positive for leg swelling. Negative for chest pain and palpitations.  Gastrointestinal:  Negative for abdominal pain, blood in stool, constipation, diarrhea, nausea and vomiting.  Endocrine: Negative.   Genitourinary:  Negative for decreased urine volume, difficulty urinating, dysuria, frequency and urgency.  Musculoskeletal:  Positive for arthralgias, back pain and gait problem. Negative for myalgias.  Skin: Negative.   Allergic/Immunologic: Negative.   Neurological:  Negative for dizziness, tremors, seizures, syncope, facial asymmetry, speech difficulty, weakness, light-headedness, numbness and headaches.  Hematological:  Bruises/bleeds easily.   Psychiatric/Behavioral:  Negative for confusion, hallucinations, sleep disturbance and suicidal ideas.   All other systems reviewed and are negative.      Objective:  BP (!) 163/90   Pulse 74   Temp (!) 97.3 F (36.3 C)   Ht '5\' 11"'  (1.803 m)   Wt 278 lb (126.1 kg)   SpO2 96%   BMI 38.77 kg/m    Wt Readings from Last 3 Encounters:  07/13/21 278 lb (126.1 kg)  02/01/21 266 lb 3.2 oz (120.7 kg)  01/24/21 269 lb 4.8  oz (122.2 kg)    Physical Exam Vitals and nursing note reviewed.  Constitutional:      General: He is not in acute distress.    Appearance: Normal appearance. He is well-developed and well-groomed. He is morbidly obese. He is not ill-appearing, toxic-appearing or diaphoretic.  HENT:     Head: Normocephalic and atraumatic.     Jaw: There is normal jaw occlusion.     Right Ear: Hearing normal.     Left Ear: Hearing normal.     Nose: Nose normal.     Mouth/Throat:     Lips: Pink.     Mouth: Mucous membranes are moist.     Pharynx: Oropharynx is clear. Uvula midline.  Eyes:     General: Lids are normal.     Extraocular Movements: Extraocular movements intact.     Conjunctiva/sclera: Conjunctivae normal.     Pupils: Pupils are equal, round, and reactive to light.  Neck:     Thyroid: No thyroid mass, thyromegaly or thyroid tenderness.     Vascular: No carotid bruit or JVD.     Trachea: Trachea and phonation normal.  Cardiovascular:     Rate and Rhythm: Normal rate and regular rhythm. No extrasystoles are present.    Chest Wall: PMI is not displaced.     Pulses: Normal pulses.          Dorsalis pedis pulses are 2+ on the right side and 2+ on the left side.       Posterior tibial pulses are 2+ on the right side and 2+ on the left side.     Heart sounds: Murmur heard.  Systolic murmur is present with a grade of 1/6.    No friction rub. No gallop.  Pulmonary:     Effort: Pulmonary effort is normal. No respiratory distress.     Breath sounds: Examination of the  right-lower field reveals decreased breath sounds and wheezing. Examination of the left-lower field reveals decreased breath sounds and wheezing. Decreased breath sounds and wheezing present. No rhonchi or rales.     Comments: Barrel chest, hyperresonant  Abdominal:     General: Bowel sounds are normal. There is no distension or abdominal bruit.     Palpations: Abdomen is soft. There is no hepatomegaly or splenomegaly.     Tenderness: There is no abdominal tenderness. There is no right CVA tenderness or left CVA tenderness.     Hernia: No hernia is present.  Musculoskeletal:        General: Normal range of motion.     Cervical back: Normal range of motion and neck supple.     Right lower leg: 1+ Edema present.     Left lower leg: 1+ Edema present.  Feet:     Right foot:     Skin integrity: Callus present.     Toenail Condition: Right toenails are abnormally thick and long.     Left foot:     Skin integrity: Callus present.     Toenail Condition: Left toenails are abnormally thick and long.  Lymphadenopathy:     Cervical: No cervical adenopathy.  Skin:    General: Skin is warm and dry.     Capillary Refill: Capillary refill takes less than 2 seconds.     Coloration: Skin is not cyanotic, jaundiced or pale.     Findings: No rash.  Neurological:     General: No focal deficit present.     Mental Status: He is alert and oriented to  person, place, and time.     Sensory: Sensation is intact.     Motor: Motor function is intact.     Coordination: Coordination is intact.     Gait: Gait abnormal (antalgic, uses cane).     Deep Tendon Reflexes: Reflexes are normal and symmetric.  Psychiatric:        Attention and Perception: Attention and perception normal.        Mood and Affect: Mood and affect normal.        Speech: Speech normal.        Behavior: Behavior normal. Behavior is cooperative.        Thought Content: Thought content normal.        Cognition and Memory: Cognition and memory  normal.        Judgment: Judgment normal.    Results for orders placed or performed in visit on 07/13/21  Bayer DCA Hb A1c Waived  Result Value Ref Range   HB A1C (BAYER DCA - WAIVED) 7.1 (H) 4.8 - 5.6 %       Pertinent labs & imaging results that were available during my care of the patient were reviewed by me and considered in my medical decision making.  Assessment & Plan:  [place] was seen today for new patient (initial visit).  Diagnoses and all orders for this visit:  Type 2 diabetes mellitus with other specified complication, without long-term current use of insulin ([place]) A1C 7.1 in office today, other labs pending. Will add Ozempic to regimen. Diet and exercise encouraged. Aware to make appointment for eye exam and dental exam. Continue to see podiatry.  -     CBC with Differential/Platelet -     CMP14+EGFR -     Lipid panel -     Bayer DCA Hb A1c Waived -     Thyroid Panel With TSH -     Microalbumin / creatinine urine ratio -     metFORMIN (GLUCOPHAGE) 500 MG tablet; Take 1 tablet (500 mg total) by mouth daily with breakfast. -     Semaglutide,0.25 or 0.5MG/DOS, (OZEMPIC, 0.25 OR 0.5 MG/DOSE,) 2 MG/1.5ML SOPN; Inject 0.25 mg into the skin once a week for 4 doses. -     Semaglutide,0.25 or 0.5MG/DOS, (OZEMPIC, 0.25 OR 0.5 MG/DOSE,) 2 MG/1.5ML SOPN; Inject 0.5 mg into the skin once a week.  Hypertension associated with diabetes ([place]) Poorly controlled but has not been taking medications as prescribed. Restart medications as prescribed. DASH diet. Monitor and report persistent high readings. Follow up in 3 months or sooner if needed.  -     CBC with Differential/Platelet -     CMP14+EGFR -     Lipid panel -     Thyroid Panel With TSH -     Microalbumin / creatinine urine ratio -     carvedilol (COREG) 25 MG tablet; Take 1 tablet (25 mg total) by mouth 2 (two) times daily.       -     spironolactone (ALDACTONE) 25 MG tablet; Take 1 tablet (25 mg total) by   mouth  daily.  Hyperlipidemia associated with type 2 diabetes mellitus ([place]) Labs pending, continue statin therapy. Diet and exercise encouraged.  -     CMP14+EGFR -     Lipid panel -     atorvastatin (LIPITOR) 80 MG tablet; Take 1 tablet (80 mg total) by mouth daily.  Acquired hypothyroidism Labs pending, will adjust repletion therapy if warranted.  -  Thyroid Panel With TSH -     levothyroxine (SYNTHROID) 100 MCG tablet; Take 1 tablet (100 mcg total) by mouth daily.  COPD mixed type ([place]) Will change Symbicort to [employer]. Smoking cessation encouraged. Follow up with pulmonology, new referral placed.  -     Budeson-Glycopyrrol-Formoterol (BREZTRI AEROSPHERE) 160-9-4.8 MCG/ACT AERO; Inhale 2 puffs into the lungs 2 (two) times daily. -     albuterol (VENTOLIN HFA) 108 (90 Base) MCG/ACT inhaler; INHALE 2 PUFFS INTO THE LUNGS EVERY 6 HOURS AS NEEDED FOR WHEEZING. -     Ambulatory referral to Pulmonology  Cigarette smoker Cessation emphasized.  -     Ambulatory referral to Pulmonology  Vitamin D deficiency Labs pending, will adjust repletion therapy if warranted.  -     VITAMIN D 25 Hydroxy (Vit-D Deficiency, Fractures)  Morbid obesity (HCC) Diet and exercise encouraged. Labs pending.   Chronic diastolic heart failure (HCC) Continue current medications. Follow up with cardiology as scheduled.  -     spironolactone (ALDACTONE) 25 MG tablet; Take 1 tablet (25 mg total) by mouth daily. -     potassium chloride (KLOR-CON) 10 MEQ tablet; Take 1 tablet (10 mEq total) by mouth daily. -     sacubitril-valsartan (ENTRESTO) 49-51 MG; TAKE (1) TABLET BY MOUTH TWICE DAILY. -     carvedilol (COREG) 25 MG tablet; Take 1 tablet (25 mg total) by mouth 2 (two) times daily.  Atherosclerosis of native coronary artery of native heart without angina pectoris Continue plavix and statin.  -     clopidogrel (PLAVIX) 75 MG tablet; Take 1 tablet (75 mg total) by mouth daily.  History of endovascular stent  graft for abdominal aortic aneurysm (AAA) Need for adequate blood pressure control discussed in detail. Continue Plavix and all other medications as prescribed.  -     clopidogrel (PLAVIX) 75 MG tablet; Take 1 tablet (75 mg total) by mouth daily.  Gastroesophageal reflux disease without esophagitis Continue PPI therapy. No red flags present.  -     pantoprazole (PROTONIX) 40 MG tablet; TAKE ONE TABLET BY MOUTH ONCE DAILY.  Screening for colorectal cancer -     Ambulatory referral to Gastroenterology    Continue all other maintenance medications.  Follow up plan: Return in about 3 months (around 10/13/2021), or if symptoms worsen or fail to improve, for DM.   Continue healthy lifestyle choices, including diet (rich in fruits, vegetables, and lean proteins, and low in salt and simple carbohydrates) and exercise (at least 30 minutes of moderate physical activity daily).  Educational handout given for DM  The above assessment and management plan was discussed with the patient. The patient verbalized understanding of and has agreed to the management plan. Patient is aware to call the clinic if they develop any new symptoms or if symptoms persist or worsen. Patient is aware when to return to the clinic for a follow-up visit. Patient educated on when it is appropriate to go to the emergency department.   Monia Pouch, FNP-C [facility] [phone]

## 2021-07-13 NOTE — Patient Instructions (Signed)

## 2021-07-14 ENCOUNTER — Encounter: Payer: Self-pay | Admitting: Internal Medicine

## 2021-07-14 LAB — CBC WITH DIFFERENTIAL/PLATELET
Basophils Absolute: 0 10*3/uL (ref 0.0–0.2)
Basos: 0 %
EOS (ABSOLUTE): 0.1 10*3/uL (ref 0.0–0.4)
Eos: 1 %
Hematocrit: 50.4 % (ref 37.5–51.0)
Hemoglobin: 16.9 g/dL (ref 13.0–17.7)
Immature Grans (Abs): 0 10*3/uL (ref 0.0–0.1)
Immature Granulocytes: 0 %
Lymphocytes Absolute: 1 10*3/uL (ref 0.7–3.1)
Lymphs: 11 %
MCH: 33 pg (ref 26.6–33.0)
MCHC: 33.5 g/dL (ref 31.5–35.7)
MCV: 98 fL — ABNORMAL HIGH (ref 79–97)
Monocytes Absolute: 0.7 10*3/uL (ref 0.1–0.9)
Monocytes: 8 %
Neutrophils Absolute: 7.3 10*3/uL — ABNORMAL HIGH (ref 1.4–7.0)
Neutrophils: 80 %
Platelets: 158 10*3/uL (ref 150–450)
RBC: 5.12 x10E6/uL (ref 4.14–5.80)
RDW: 13 % (ref 11.6–15.4)
WBC: 9.3 10*3/uL (ref 3.4–10.8)

## 2021-07-14 LAB — LIPID PANEL
Chol/HDL Ratio: 4 ratio (ref 0.0–5.0)
Cholesterol, Total: 135 mg/dL (ref 100–199)
HDL: 34 mg/dL — ABNORMAL LOW (ref >39)
LDL Chol Calc (NIH): 78 mg/dL (ref 0–99)
Triglycerides: 130 mg/dL (ref 0–149)
VLDL Cholesterol Cal: 23 mg/dL (ref 5–40)

## 2021-07-14 LAB — VITAMIN D 25 HYDROXY (VIT D DEFICIENCY, FRACTURES): Vit D, 25-Hydroxy: 52.4 ng/mL (ref 30.0–100.0)

## 2021-07-14 LAB — THYROID PANEL WITH TSH
Free Thyroxine Index: 2.8 (ref 1.2–4.9)
T3 Uptake Ratio: 29 % (ref 24–39)
T4, Total: 9.5 ug/dL (ref 4.5–12.0)
TSH: 3.16 u[IU]/mL (ref 0.450–4.500)

## 2021-07-14 LAB — CMP14+EGFR
ALT: 19 IU/L (ref 0–44)
AST: 15 IU/L (ref 0–40)
Albumin/Globulin Ratio: 1.7 (ref 1.2–2.2)
Albumin: 4.3 g/dL (ref 3.8–4.8)
Alkaline Phosphatase: 148 IU/L — ABNORMAL HIGH (ref 44–121)
BUN/Creatinine Ratio: 15 (ref 10–24)
BUN: 18 mg/dL (ref 8–27)
Bilirubin Total: 0.4 mg/dL (ref 0.0–1.2)
CO2: 29 mmol/L (ref 20–29)
Calcium: 9.2 mg/dL (ref 8.6–10.2)
Chloride: 100 mmol/L (ref 96–106)
Creatinine, Ser: 1.21 mg/dL (ref 0.76–1.27)
Globulin, Total: 2.6 g/dL (ref 1.5–4.5)
Glucose: 119 mg/dL — ABNORMAL HIGH (ref 70–99)
Potassium: 4.8 mmol/L (ref 3.5–5.2)
Sodium: 142 mmol/L (ref 134–144)
Total Protein: 6.9 g/dL (ref 6.0–8.5)
eGFR: 65 mL/min/{1.73_m2} (ref >59)

## 2021-07-14 LAB — MICROALBUMIN / CREATININE URINE RATIO
Creatinine, Urine: 132.9 mg/dL
Microalb/Creat Ratio: 56 mg/g creat — ABNORMAL HIGH (ref 0–29)
Microalbumin, Urine: 74.4 ug/mL

## 2021-07-19 DIAGNOSIS — J449 Chronic obstructive pulmonary disease, unspecified: Secondary | ICD-10-CM | POA: Diagnosis not present

## 2021-07-26 ENCOUNTER — Other Ambulatory Visit: Payer: Self-pay | Admitting: *Deleted

## 2021-07-26 NOTE — Telephone Encounter (Signed)
TC from [facility] pharmacy Pt needing refill on Furosemide Last refilled by Dr. Patty Sermons office in May Last OV here 07/13/21 Next OV 10/14/21

## 2021-07-27 MED ORDER — FUROSEMIDE 40 MG PO TABS: 60.0000 mg | ORAL_TABLET | Freq: Every day | ORAL | 6 refills | Status: AC

## 2021-08-18 DIAGNOSIS — J449 Chronic obstructive pulmonary disease, unspecified: Secondary | ICD-10-CM | POA: Diagnosis not present

## 2021-09-01 ENCOUNTER — Encounter: Payer: Self-pay | Admitting: Internal Medicine

## 2021-09-13 DIAGNOSIS — I639 Cerebral infarction, unspecified: Secondary | ICD-10-CM | POA: Diagnosis not present

## 2021-09-13 DIAGNOSIS — K219 Gastro-esophageal reflux disease without esophagitis: Secondary | ICD-10-CM | POA: Diagnosis not present

## 2021-09-13 DIAGNOSIS — I10 Essential (primary) hypertension: Secondary | ICD-10-CM | POA: Diagnosis not present

## 2021-09-13 DIAGNOSIS — E119 Type 2 diabetes mellitus without complications: Secondary | ICD-10-CM | POA: Diagnosis not present

## 2021-09-13 DIAGNOSIS — E559 Vitamin D deficiency, unspecified: Secondary | ICD-10-CM | POA: Diagnosis not present

## 2021-09-13 DIAGNOSIS — J449 Chronic obstructive pulmonary disease, unspecified: Secondary | ICD-10-CM | POA: Diagnosis not present

## 2021-09-13 DIAGNOSIS — E039 Hypothyroidism, unspecified: Secondary | ICD-10-CM | POA: Diagnosis not present

## 2021-09-18 ENCOUNTER — Encounter (HOSPITAL_COMMUNITY): Payer: Self-pay | Admitting: Emergency Medicine

## 2021-09-18 ENCOUNTER — Emergency Department (HOSPITAL_COMMUNITY)
Admission: EM | Admit: 2021-09-18 | Discharge: 2021-09-18 | Discharge status: Against Medical Advice | Payer: Medicare Other | Attending: Emergency Medicine | Admitting: Emergency Medicine

## 2021-09-18 ENCOUNTER — Emergency Department (HOSPITAL_COMMUNITY): Payer: Medicare Other

## 2021-09-18 DIAGNOSIS — E119 Type 2 diabetes mellitus without complications: Secondary | ICD-10-CM | POA: Insufficient documentation

## 2021-09-18 DIAGNOSIS — Z7982 Long term (current) use of aspirin: Secondary | ICD-10-CM | POA: Diagnosis not present

## 2021-09-18 DIAGNOSIS — I2510 Atherosclerotic heart disease of native coronary artery without angina pectoris: Secondary | ICD-10-CM | POA: Insufficient documentation

## 2021-09-18 DIAGNOSIS — I509 Heart failure, unspecified: Secondary | ICD-10-CM | POA: Diagnosis not present

## 2021-09-18 DIAGNOSIS — I110 Hypertensive heart disease with heart failure: Secondary | ICD-10-CM | POA: Diagnosis not present

## 2021-09-18 DIAGNOSIS — Z7984 Long term (current) use of oral hypoglycemic drugs: Secondary | ICD-10-CM | POA: Insufficient documentation

## 2021-09-18 DIAGNOSIS — M79605 Pain in left leg: Secondary | ICD-10-CM | POA: Diagnosis not present

## 2021-09-18 DIAGNOSIS — I701 Atherosclerosis of renal artery: Secondary | ICD-10-CM | POA: Diagnosis not present

## 2021-09-18 DIAGNOSIS — I723 Aneurysm of iliac artery: Secondary | ICD-10-CM | POA: Diagnosis not present

## 2021-09-18 DIAGNOSIS — N261 Atrophy of kidney (terminal): Secondary | ICD-10-CM | POA: Diagnosis not present

## 2021-09-18 DIAGNOSIS — Z7902 Long term (current) use of antithrombotics/antiplatelets: Secondary | ICD-10-CM | POA: Diagnosis not present

## 2021-09-18 DIAGNOSIS — J449 Chronic obstructive pulmonary disease, unspecified: Secondary | ICD-10-CM | POA: Insufficient documentation

## 2021-09-18 DIAGNOSIS — I724 Aneurysm of artery of lower extremity: Secondary | ICD-10-CM | POA: Diagnosis not present

## 2021-09-18 LAB — BASIC METABOLIC PANEL
Anion gap: 10 (ref 5–15)
BUN: 13 mg/dL (ref 8–23)
CO2: 26 mmol/L (ref 22–32)
Calcium: 8.7 mg/dL — ABNORMAL LOW (ref 8.9–10.3)
Chloride: 103 mmol/L (ref 98–111)
Creatinine, Ser: 1.16 mg/dL (ref 0.61–1.24)
GFR, Estimated: >60 mL/min (ref >60)
Glucose, Bld: 194 mg/dL — ABNORMAL HIGH (ref 70–99)
Potassium: 3.5 mmol/L (ref 3.5–5.1)
Sodium: 139 mmol/L (ref 135–145)

## 2021-09-18 LAB — CBC WITH DIFFERENTIAL/PLATELET
Abs Immature Granulocytes: 0.04 10*3/uL (ref 0.00–0.07)
Basophils Absolute: 0 10*3/uL (ref 0.0–0.1)
Basophils Relative: 0 %
Eosinophils Absolute: 0.1 10*3/uL (ref 0.0–0.5)
Eosinophils Relative: 1 %
HCT: 52.3 % — ABNORMAL HIGH (ref 39.0–52.0)
Hemoglobin: 17.4 g/dL — ABNORMAL HIGH (ref 13.0–17.0)
Immature Granulocytes: 0 %
Lymphocytes Relative: 8 %
Lymphs Abs: 0.9 10*3/uL (ref 0.7–4.0)
MCH: 33.3 pg (ref 26.0–34.0)
MCHC: 33.3 g/dL (ref 30.0–36.0)
MCV: 100.2 fL — ABNORMAL HIGH (ref 80.0–100.0)
Monocytes Absolute: 0.6 10*3/uL (ref 0.1–1.0)
Monocytes Relative: 5 %
Neutro Abs: 9.1 10*3/uL — ABNORMAL HIGH (ref 1.7–7.7)
Neutrophils Relative %: 86 %
Platelets: 156 10*3/uL (ref 150–400)
RBC: 5.22 MIL/uL (ref 4.22–5.81)
RDW: 14.6 % (ref 11.5–15.5)
WBC: 10.7 10*3/uL — ABNORMAL HIGH (ref 4.0–10.5)
nRBC: 0 % (ref 0.0–0.2)

## 2021-09-18 LAB — LACTIC ACID, PLASMA: Lactic Acid, Venous: 2.4 mmol/L (ref 0.5–1.9)

## 2021-09-18 LAB — CK: Total CK: 60 U/L (ref 49–397)

## 2021-09-18 LAB — D-DIMER, QUANTITATIVE: D-Dimer, Quant: 1.97 ug/mL-FEU — ABNORMAL HIGH (ref 0.00–0.50)

## 2021-09-18 MED ORDER — IOHEXOL 350 MG/ML SOLN
100.0000 mL | Freq: Once | INTRAVENOUS | Status: AC | PRN
  Administered 2021-09-18: 100 mL via INTRAVENOUS

## 2021-09-18 MED ORDER — IOHEXOL 300 MG/ML  SOLN: 100.0000 mL | Freq: Once | INTRAMUSCULAR | Status: DC | PRN

## 2021-09-18 NOTE — ED Provider Notes (Signed)
[facility] EMERGENCY DEPARTMENT Provider Note   CSN: [id number] Arrival date & time: 09/18/21  0001     History  Chief Complaint  Patient presents with   Leg Pain    Bruce Barrera is a 71 y.o. male.  Patient presents to the emergency department for evaluation of left leg pain.  Patient reports that symptoms began 5 days ago.  Initially it was mostly pain in the left hip and thigh.  Patient reports that the hip and thigh are better but now he is having pain in the anterior portion of the lower leg that has been persistent and severe.  Patient reports that he had aneurysm repair in his abdomen 2 years ago.  He tells me that his vascular surgeon told him he needed a CT scan on his leg to look at his arteries a year ago but he is still waiting for the appointment.      Home Medications Prior to Admission medications   Medication Sig Start Date End Date Taking? Authorizing Provider  albuterol (VENTOLIN HFA) 108 (90 Base) MCG/ACT inhaler INHALE 2 PUFFS INTO THE LUNGS EVERY 6 HOURS AS NEEDED FOR WHEEZING. 07/13/21   Baruch Gouty, FNP  aspirin 81 MG tablet Take 1 tablet (81 mg total) by mouth daily with breakfast. 12/09/19   Denton Brick, Courage, MD  atorvastatin (LIPITOR) 80 MG tablet Take 1 tablet (80 mg total) by mouth daily. 07/13/21   Baruch Gouty, FNP  blood glucose meter kit and supplies KIT Dispense based on patient and insurance preference. Use to check your blood sugar daily before your first meal. 11/03/20   Ailene Ards, NP  Budeson-Glycopyrrol-Formoterol (BREZTRI AEROSPHERE) 160-9-4.8 MCG/ACT AERO Inhale 2 puffs into the lungs 2 (two) times daily. 07/13/21   Baruch Gouty, FNP  calcium-vitamin D (OSCAL WITH D) 500-200 MG-UNIT tablet Take 1 tablet by mouth daily with breakfast.    [provider]  carvedilol (COREG) 25 MG tablet Take 1 tablet (25 mg total) by mouth 2 (two) times daily. 07/13/21 10/11/21  Baruch Gouty, FNP  Cholecalciferol (VITAMIN D3) 50 MCG (2000 UT)  TABS Take 1 tablet by mouth daily.    [provider]  clopidogrel (PLAVIX) 75 MG tablet Take 1 tablet (75 mg total) by mouth daily. 07/13/21   Baruch Gouty, FNP  cyanocobalamin 100 MCG tablet Take 100 mcg by mouth daily.    [provider]  furosemide (LASIX) 40 MG tablet Take 1.5 tablets (60 mg total) by mouth daily. 07/27/21   Rakes, Connye Burkitt, FNP  Lancets [facility] DELICA PLUS [id number]) MISC Apply topically daily. 11/05/20   [provider]  levothyroxine (SYNTHROID) 100 MCG tablet Take 1 tablet (100 mcg total) by mouth daily. 07/13/21   Baruch Gouty, FNP  metFORMIN (GLUCOPHAGE) 500 MG tablet Take 1 tablet (500 mg total) by mouth daily with breakfast. 07/13/21   Rakes, Connye Burkitt, FNP  mupirocin ointment (BACTROBAN) 2 % Apply topically 2 (two) times daily as needed. 01/06/21   [provider]  [facility] ULTRA test strip daily. 11/05/20   [provider]  pantoprazole (PROTONIX) 40 MG tablet TAKE ONE TABLET BY MOUTH ONCE DAILY. 07/13/21   Baruch Gouty, FNP  potassium chloride (KLOR-CON) 10 MEQ tablet Take 1 tablet (10 mEq total) by mouth daily. 07/13/21   Baruch Gouty, FNP  sacubitril-valsartan (ENTRESTO) 49-51 MG TAKE (1) TABLET BY MOUTH TWICE DAILY. 07/13/21   Baruch Gouty, FNP  Semaglutide,0.25 or 0.5MG/DOS, ([place],  0.25 OR 0.5 MG/DOSE,) 2 MG/1.5ML SOPN Inject 0.5 mg into the skin once a week. 08/17/21   Baruch Gouty, FNP  spironolactone (ALDACTONE) 25 MG tablet Take 1 tablet (25 mg total) by mouth daily. 07/13/21 10/11/21  Baruch Gouty, FNP  triamcinolone (KENALOG) 0.025 % ointment Apply 1 application topically 2 (two) times daily. 10/14/20   Ailene Ards, NP      Allergies    Patient has no known allergies.    Review of Systems   Review of Systems  Physical Exam Updated Vital Signs BP (!) 136/93    Pulse 79    Temp 97.9 F (36.6 C) (Oral)    Resp 19    Ht '5\' 11"'  (1.803 m)    Wt 122.5 kg    SpO2 95%    BMI 37.66 kg/m  Physical  Exam Vitals and nursing note reviewed.  Constitutional:      General: He is not in acute distress.    Appearance: Normal appearance. He is well-developed.  HENT:     Head: Normocephalic and atraumatic.     Right Ear: Hearing normal.     Left Ear: Hearing normal.     Nose: Nose normal.  Eyes:     Conjunctiva/sclera: Conjunctivae normal.     Pupils: Pupils are equal, round, and reactive to light.  Cardiovascular:     Rate and Rhythm: Regular rhythm.     Heart sounds: S1 normal and S2 normal. No murmur heard.   No friction rub. No gallop.  Pulmonary:     Effort: Pulmonary effort is normal. No respiratory distress.     Breath sounds: Normal breath sounds.  Chest:     Chest wall: No tenderness.  Abdominal:     General: Bowel sounds are normal.     Palpations: Abdomen is soft.     Tenderness: There is no abdominal tenderness. There is no guarding or rebound. Negative signs include Murphy's sign and McBurney's sign.     Hernia: No hernia is present.  Musculoskeletal:        General: Normal range of motion.     Cervical back: Normal range of motion and neck supple.  Skin:    General: Skin is warm and dry.     Findings: No rash.  Neurological:     Mental Status: He is alert and oriented to person, place, and time.     GCS: GCS eye subscore is 4. GCS verbal subscore is 5. GCS motor subscore is 6.     Cranial Nerves: No cranial nerve deficit.     Sensory: No sensory deficit.     Coordination: Coordination normal.  Psychiatric:        Speech: Speech normal.        Behavior: Behavior normal.        Thought Content: Thought content normal.    ED Results / Procedures / Treatments   Labs (all labs ordered are listed, but only abnormal results are displayed) Labs Reviewed  CBC WITH DIFFERENTIAL/PLATELET - Abnormal; Notable for the following components:      Result Value   WBC 10.7 (*)    Hemoglobin 17.4 (*)    HCT 52.3 (*)    MCV 100.2 (*)    Neutro Abs 9.1 (*)    All other  components within normal limits  BASIC METABOLIC PANEL - Abnormal; Notable for the following components:   Glucose, Bld 194 (*)    Calcium 8.7 (*)    All  other components within normal limits  LACTIC ACID, PLASMA - Abnormal; Notable for the following components:   Lactic Acid, Venous 2.4 (*)    All other components within normal limits  D-DIMER, QUANTITATIVE - Abnormal; Notable for the following components:   D-Dimer, Quant 1.97 (*)    All other components within normal limits  CK    EKG None  Radiology CT ANGIO AO+BIFEM W & OR WO CONTRAST  Result Date: 09/18/2021 CLINICAL DATA:  71 year old male with left lower extremity constant pain for 5 days. No known injury. History of abdominal aortic aneurysm endograft repair, stent graft exclusion of left Common iliac artery aneurysm. EXAM: CT ANGIOGRAPHY OF ABDOMINAL AORTA WITH ILIOFEMORAL RUNOFF TECHNIQUE: Multidetector CT imaging of the abdomen, pelvis and lower extremities was performed using the standard protocol during bolus administration of intravenous contrast. Multiplanar CT image reconstructions and MIPs were obtained to evaluate the vascular anatomy. CONTRAST:  181m OMNIPAQUE IOHEXOL 350 MG/ML SOLN COMPARISON:  CTA abdomen and pelvis 12/03/2020 and earlier. FINDINGS: VASCULAR Aorta: Bifurcated endograft repair of abdominal aortic aneurysm. Native aneurysm sac of to 61 mm diameter has mildly decreased since last year (65 mm at the same level). Endograft is patent. Celiac: Stable and patent with only mild atherosclerosis. SMA: Stable and patent with only mild atherosclerosis. Renals: Left renal artery is patent with mild atherosclerosis. There is chronic right renal arteries stenosis, and the vessel appears somewhat atretic, unchanged. See renal findings below. IMA: Appears supplied by collaterals and patent. RIGHT Lower Extremity Inflow: Patent bifurcated endograft supplying the right iliac arteries. Stable iliac artery atherosclerosis without  significant stenosis. Outflow: Right femoral arteries are patent with atherosclerosis but no significant stenosis. However, the right popliteal artery is abnormal in the popliteal fossa with radiographic string sign stenosis (series 4, image 270) along a 15 mm segment (series 7, image 77) the does remain patent. Runoff: Three-vessel runoff with atherosclerosis but no high-grade stenosis. LEFT Lower Extremity Inflow: Covered stent treatment of left Common iliac artery aneurysm with stable to decreased aneurysm sac by 1 mm since last year (now 28 mm). Coil embolization of the left internal iliac artery unchanged. Patent Common left iliac stent and left external iliac with stable atherosclerosis, no significant stenosis. Outflow: Patent left femoral arteries with only mild atherosclerosis and no stenosis identified through the distal SFA. But the left popliteal artery is abnormal with a roughly 18 mm short segment occlusion versus severe stenosis (series 7, image 168 and series 4, image 269) although the distal popliteal is reconstituted. Runoff: Three-vessel runoff with mild atherosclerosis and no high-grade stenosis. Veins: Not evaluated with this contrast timing. Review of the MIP images confirms the above findings. NON-VASCULAR Lower chest: No pericardial effusion. Lower lung volumes, but lung bases remain negative. Hepatobiliary: Stable liver and gallbladder. Possible small dependent gallstone. Pancreas: Stable pancreatic atrophy. Spleen: Diminutive, negative. Adrenals/Urinary Tract: Stable and normal adrenal glands. Asymmetrically diminished right renal enhancement (series 4, image 59 and chronic right renal atrophy. Benign appearing bilateral renal cysts. Nonobstructed left kidney. No delayed renal images. No hydronephrosis or hydroureter. Diminutive bladder. Stomach/Bowel: Mildly redundant large bowel with mild diverticulosis of the descending and sigmoid colon. Mild low-density retained stool throughout.  Normal appendix on series 4, image 110. No large bowel inflammation. Decompressed terminal ileum. No dilated small bowel. Small fat containing umbilical hernia is stable. Mild fluid-filled stomach. Negative duodenum. No free air, free fluid, or mesenteric inflammation. Lymphatic: No lymphadenopathy. Reproductive: Negative. Other: No pelvic free fluid. Musculoskeletal: Chronic degenerative changes in  the visible thoracolumbar spine. Degenerative vacuum disc is new at L4-L5 where there is chronic trace anterolisthesis and moderate to severe facet arthropathy. Chronic right hip arthroplasty appears intact and aligned. Fairly symmetric bilateral knee joint degeneration. Other lower extremity osseous structures appear intact and aligned. No acute or suspicious osseous finding. IMPRESSION: VASCULAR 1. Positive for abnormal bilateral Popliteal Arteries: - Left Popliteal 18 mm segmental Occlusion vs Very Severe Stenosis (series 4, image 270) with reconstituted flow. Three vessel left lower extremity runoff. - contralateral Right Popliteal similar high-grade Radiographic String Sign Stenosis (series 7 image 77). Three vessel right lower extremity runoff. 2. Chronic very severe right renal artery, with atretic appearance of the artery, chronic right renal atrophy and hypoenhancement. 3. Satisfactory repair of abdominal aortic aneurysm with bifurcated aortic endograft. Native aneurysm sac has decreased since last year, now 61 mm diameter. 4. Similar stable and satisfactory endovascular treatment of left iliac artery aneurysm. 5.  Aortic Atherosclerosis (ICD10-I70.0). NON-VASCULAR 1. No superimposed acute or inflammatory process in the abdomen or pelvis. 2. Chronic right renal atrophy and hypoenhancement related to chronic very severe right renal artery stenosis. Electronically Signed   By: Genevie Ann M.D.   On: 09/18/2021 04:55    Procedures Procedures    Medications Ordered in ED Medications  iohexol (OMNIPAQUE) 350  MG/ML injection 100 mL (100 mLs Intravenous Contrast Given 09/18/21 0409)    ED Course/ Medical Decision Making/ A&P                           Medical Decision Making  71 year old man with CAD, hyperlipidemia, morbid obesity, COPD, CHF, diabetes, hypertension AAA with endovascular repair presents with left leg pain.  Patient reports that he initially had pain in the left hip area that radiated to the thigh, now he is experiencing pain in the lower leg, more in the front and in the calf.  Suspect that this is radiculopathy, but with his vascular history work-up was pursued.  Patient did have reassuring lab work.  CT angiography of abdomen with runoff bilaterally was performed.  He does have chronic popliteal stenosis or blockage with reconstitution distally, no emergent findings.  His D-dimer was elevated.  I discussed with him the possibility of waiting a couple of hours until ultrasound is available to rule out DVT.  He declined staying.  I offered to schedule him to come back later and he declined that as well.  I then offered to give him a dose of blood thinner and schedule him for tomorrow, but he became agitated, refused to stay long enough to schedule the study and left the department.  I did discuss with him the possibility of DVT with dire consequences.  He understands this but reports that he is uncomfortable and does not wish to stay any longer.  He is awake, alert and oriented, understands the risks and chooses to leave without further treatment.  He was discharged [place].         Final Clinical Impression(s) / ED Diagnoses Final diagnoses:  Left leg pain    Rx / DC Orders ED Discharge Orders          Ordered    US Venous Img Lower Unilateral Left        09/18/21 0606              Orpah Greek, MD 09/18/21 [phone]

## 2021-09-18 NOTE — ED Notes (Signed)
Pt took off oxygen, pt took off BP and pulse ox for the 3rd time- this nurse placed pt back on monitor- pt O2 sats are WNL at this time.

## 2021-09-18 NOTE — ED Triage Notes (Signed)
Pt c/o left lower leg pain since Tuesday. No injury or swelling noted.

## 2021-09-18 NOTE — ED Notes (Signed)
Lab at bedside drawing blood at this time.

## 2021-09-18 NOTE — ED Notes (Signed)
Pt refused to sign AMA form.

## 2021-09-18 NOTE — ED Notes (Signed)
Pt just informed this nurse that he was on oxygen at night time- Dr Blinda Leatherwood made aware- pt placed on 2L O2 via [place]

## 2021-09-18 NOTE — ED Notes (Signed)
Pt given water at this time . Amyra Vantuyl °

## 2021-09-21 ENCOUNTER — Ambulatory Visit (INDEPENDENT_AMBULATORY_CARE_PROVIDER_SITE_OTHER): Payer: Medicare Other | Admitting: Vascular Surgery

## 2021-09-21 ENCOUNTER — Other Ambulatory Visit: Payer: Self-pay

## 2021-09-21 ENCOUNTER — Encounter: Payer: Self-pay | Admitting: Vascular Surgery

## 2021-09-21 VITALS — BP 165/99 | HR 88 | Temp 98.2°F | Resp 20 | Ht 71.0 in | Wt 275.0 lb

## 2021-09-21 DIAGNOSIS — I7133 Infrarenal abdominal aortic aneurysm, ruptured: Secondary | ICD-10-CM | POA: Diagnosis not present

## 2021-09-21 DIAGNOSIS — M25562 Pain in left knee: Secondary | ICD-10-CM | POA: Diagnosis not present

## 2021-09-21 MED ORDER — OXYCODONE-ACETAMINOPHEN 5-325 MG PO TABS: 1.0000 | ORAL_TABLET | ORAL | 0 refills | Status: AC | PRN

## 2021-09-21 NOTE — Progress Notes (Signed)
Patient ID: Bruce Barrera, male   DOB: 22-Aug-1951, 71 y.o.   MRN: [id number]  Reason for Consult: Follow-up   Referred by Adaline Sill, NP  Subjective:     HPI:  Bruce Barrera is a 71 y.o. male history of ruptured abdominal endograft.  Recently evaluated in the emergency department with left lower extremity pain.  He states that his pain is extreme from his left hip radiating down the left lateral aspect of his thigh and into the his lower leg.  He did have a CT angio performed while in the hospital was offered DVT study but deferred.  He has not been taking anything for pain.  He states that he has been unable to sleep due to the pain.  He does not have any tissue loss or pain in either of his feet.  He continues to smoke daily.  Past Medical History:  Diagnosis Date   AAA (abdominal aortic aneurysm)    Arthritis    Hips and knees   Back pain    Chronic systolic CHF (congestive heart failure) (HCC)    Coronary artery disease    a. 03/2016 subacute anterior MI -> 100% prox LAD >72 hours out thus treated medically, LVEF 30-35% initially   Essential hypertension    GERD (gastroesophageal reflux disease)    Hemorrhoids    History of stroke    States "stroke" with left sided weakness 5/95 in prison in [place]   Hyperlipidemia    Hypothyroidism    Ischemic cardiomyopathy    MI (myocardial infarction) ([place])    States "heart attack" 2/95 in prison in [place]   Obesity    Peptic ulcer    GIB 1994   Sleep apnea    Vitamin D deficiency disease 06/19/2019   Family History  Problem Relation Age of Onset   Hypertension Mother    Diabetes Mother    Heart disease Mother    Stroke Mother    Stroke Father    Heart attack Brother    Aneurysm Brother    Cirrhosis Brother    Past Surgical History:  Procedure Laterality Date   ABDOMINAL AORTIC ENDOVASCULAR STENT GRAFT N/A 09/09/2019   Procedure: ABDOMINAL AORTIC ENDOVASCULAR STENT GRAFT;  Surgeon: Waynetta Sandy, MD;   Location: [facility];  Service: Vascular;  Laterality: N/A;   ADJUSTABLE SUTURE MANIPULATION Left 04/10/2018   Procedure: ADJUSTABLE SUTURE MANIPULATION;  Surgeon: Gevena Cotton, MD;  Location: [place];  Service: Ophthalmology;  Laterality: Left;   CARDIAC CATHETERIZATION N/A 03/15/2016   Procedure: Right/Left Heart Cath and Coronary Angiography;  Surgeon: Wellington Hampshire, MD;  Location: [place] [facility];  Service: Cardiovascular;  Laterality: N/A;   COLONOSCOPY  03/23/2011   hemorrhoids   KNEE ARTHROSCOPY     Right knee x 2   MEDIAN RECTUS REPAIR Left 04/10/2018   Procedure: LEFT MEDIAN RECTUS REPAIR;  Surgeon: Gevena Cotton, MD;  Location: [place] [facility];  Service: Ophthalmology;  Laterality: Left;   MUSCLE RECESSION AND RESECTION Left 04/10/2018   Procedure: LEFT MUSCLE RECESSION;  Surgeon: Gevena Cotton, MD;  Location: [facility];  Service: Ophthalmology;  Laterality: Left;   TOTAL HIP ARTHROPLASTY Right 2014    Short Social History:  Social History   Tobacco Use   Smoking status: Every Day    Packs/day: 1.50    Years: 58.00    Pack years: 87.00    Types: Cigarettes    Start date: 05/18/1961   Smokeless tobacco: Never  Tobacco comments:    smokes 35 cigarettes a day (06/02/2020)  Substance Use Topics   Alcohol use: No    No Known Allergies  Current Outpatient Medications  Medication Sig Dispense Refill   albuterol (VENTOLIN HFA) 108 (90 Base) MCG/ACT inhaler INHALE 2 PUFFS INTO THE LUNGS EVERY 6 HOURS AS NEEDED FOR WHEEZING. 18 g 6   aspirin 81 MG tablet Take 1 tablet (81 mg total) by mouth daily with breakfast. 30 tablet 2   atorvastatin (LIPITOR) 80 MG tablet Take 1 tablet (80 mg total) by mouth daily. 90 tablet 3   blood glucose meter kit and supplies KIT Dispense based on patient and insurance preference. Use to check your blood sugar daily before your first meal. 1 each 0   Budeson-Glycopyrrol-Formoterol (BREZTRI AEROSPHERE) 160-9-4.8 MCG/ACT AERO Inhale 2 puffs into the lungs 2  (two) times daily. 10.7 g 11   calcium-vitamin D (OSCAL WITH D) 500-200 MG-UNIT tablet Take 1 tablet by mouth daily with breakfast.     carvedilol (COREG) 25 MG tablet Take 1 tablet (25 mg total) by mouth 2 (two) times daily. 180 tablet 3   Cholecalciferol (VITAMIN D3) 50 MCG (2000 UT) TABS Take 1 tablet by mouth daily.     clopidogrel (PLAVIX) 75 MG tablet Take 1 tablet (75 mg total) by mouth daily. 90 tablet 2   cyanocobalamin 100 MCG tablet Take 100 mcg by mouth daily.     furosemide (LASIX) 40 MG tablet Take 1.5 tablets (60 mg total) by mouth daily. 45 tablet 6   Lancets (ONETOUCH DELICA PLUS [id number]) MISC Apply topically daily.     levothyroxine (SYNTHROID) 100 MCG tablet Take 1 tablet (100 mcg total) by mouth daily. 90 tablet 2   mupirocin ointment (BACTROBAN) 2 % Apply topically 2 (two) times daily as needed.     ONETOUCH ULTRA test strip daily.     pantoprazole (PROTONIX) 40 MG tablet TAKE ONE TABLET BY MOUTH ONCE DAILY. 30 tablet 3   potassium chloride (KLOR-CON) 10 MEQ tablet Take 1 tablet (10 mEq total) by mouth daily. 90 tablet 1   sacubitril-valsartan (ENTRESTO) 49-51 MG TAKE (1) TABLET BY MOUTH TWICE DAILY. 60 tablet 3   Semaglutide,0.25 or 0.5MG/DOS, ([place], 0.25 OR 0.5 MG/DOSE,) 2 MG/1.5ML SOPN Inject 0.5 mg into the skin once a week. 1.5 mL 6   spironolactone (ALDACTONE) 25 MG tablet Take 1 tablet (25 mg total) by mouth daily. 90 tablet 3   SYNJARDY XR 01-999 MG TB24 Take 1 tablet by mouth every morning.     triamcinolone (KENALOG) 0.025 % ointment Apply 1 application topically 2 (two) times daily. 30 g 0   No current facility-administered medications for this visit.    Review of Systems  Constitutional:  Constitutional negative. HENT: HENT negative.  Eyes: Eyes negative.  Respiratory: Respiratory negative.  Cardiovascular: Cardiovascular negative.  GI: Gastrointestinal negative.  Musculoskeletal: Positive for gait problem and leg pain.  Hematologic:  Hematologic/lymphatic negative.  Psychiatric: Psychiatric negative.       Objective:  Objective  Vitals:   09/21/21 1114  BP: (!) 165/99  Pulse: 88  Resp: 20  Temp: 98.2 F (36.8 C)  SpO2: 93%     Physical Exam Constitutional:      Appearance: He is obese.  HENT:     Head: Normocephalic.     Nose:     Comments: Wearing a mask Eyes:     Pupils: Pupils are equal, round, and reactive to light.  Cardiovascular:  Pulses:          Popliteal pulses are 0 on the right side and 0 on the left side.       Dorsalis pedis pulses are detected w/ Doppler on the right side and detected w/ Doppler on the left side.       Posterior tibial pulses are detected w/ Doppler on the right side and detected w/ Doppler on the left side.  Abdominal:     General: Abdomen is flat.  Musculoskeletal:     Cervical back: Neck supple.  Skin:    General: Skin is warm and dry.     Capillary Refill: Capillary refill takes less than 2 seconds.  Neurological:     General: No focal deficit present.     Mental Status: He is alert.  Psychiatric:        Mood and Affect: Mood normal.        Behavior: Behavior normal.    Data: CT angio IMPRESSION: VASCULAR   1. Positive for abnormal bilateral Popliteal Arteries: - Left Popliteal 18 mm segmental Occlusion vs Very Severe Stenosis (series 4, image 270) with reconstituted flow. Three vessel left lower extremity runoff. - contralateral Right Popliteal similar high-grade Radiographic String Sign Stenosis (series 7 image 77). Three vessel right lower extremity runoff. 2. Chronic very severe right renal artery, with atretic appearance of the artery, chronic right renal atrophy and hypoenhancement. 3. Satisfactory repair of abdominal aortic aneurysm with bifurcated aortic endograft. Native aneurysm sac has decreased since last year, now 61 mm diameter. 4. Similar stable and satisfactory endovascular treatment of left iliac artery aneurysm. 5.  Aortic  Atherosclerosis (ICD10-I70.0).   NON-VASCULAR   1. No superimposed acute or inflammatory process in the abdomen or pelvis. 2. Chronic right renal atrophy and hypoenhancement related to chronic very severe right renal artery stenosis.     Assessment/Plan:     71 year old male with previous ruptured abdominal aortic aneurysm repair.  He now has left leg pain radiating from his hip down his lateral thigh and leg.  This appears to be mostly related to sciatica or from another neuropathic reason.  His legs appear well perfused although he does have occluded popliteal arteries bilaterally.  I have sent him a few pain pills to his pharmacy to get him through but have instructed him to follow-up with primary care doctor for further evaluation of this pain.  He will see me in 6 months with EVAR duplex and we will get ABIs at that time.     Waynetta Sandy MD [facility]

## 2021-09-23 ENCOUNTER — Other Ambulatory Visit: Payer: Self-pay | Admitting: *Deleted

## 2021-09-23 DIAGNOSIS — M25562 Pain in left knee: Secondary | ICD-10-CM

## 2021-09-23 DIAGNOSIS — I713 Abdominal aortic aneurysm, ruptured, unspecified: Secondary | ICD-10-CM

## 2021-10-05 DIAGNOSIS — J449 Chronic obstructive pulmonary disease, unspecified: Secondary | ICD-10-CM | POA: Diagnosis not present

## 2021-10-14 ENCOUNTER — Ambulatory Visit: Payer: Medicare Other | Admitting: Gastroenterology

## 2021-10-14 ENCOUNTER — Ambulatory Visit: Payer: Medicare Other | Admitting: Family Medicine

## 2021-11-01 ENCOUNTER — Other Ambulatory Visit: Payer: Self-pay | Admitting: Cardiology

## 2021-11-01 DIAGNOSIS — E1159 Type 2 diabetes mellitus with other circulatory complications: Secondary | ICD-10-CM

## 2021-11-01 DIAGNOSIS — I5032 Chronic diastolic (congestive) heart failure: Secondary | ICD-10-CM

## 2022-01-09 DEATH — deceased

## 2022-02-09 ENCOUNTER — Ambulatory Visit (INDEPENDENT_AMBULATORY_CARE_PROVIDER_SITE_OTHER): Payer: Medicare Other | Admitting: Nurse Practitioner
